# Patient Record
Sex: Female | Born: 1970
Health system: Southern US, Community
[De-identification: ages and names within clinical notes are randomized; demographics above are authoritative.]

## PROBLEM LIST (undated history)

## (undated) DIAGNOSIS — R519 Headache, unspecified: Secondary | ICD-10-CM

## (undated) DIAGNOSIS — R87619 Unspecified abnormal cytological findings in specimens from cervix uteri: Secondary | ICD-10-CM

## (undated) DIAGNOSIS — F988 Other specified behavioral and emotional disorders with onset usually occurring in childhood and adolescence: Secondary | ICD-10-CM

## (undated) DIAGNOSIS — G8929 Other chronic pain: Secondary | ICD-10-CM

## (undated) DIAGNOSIS — E538 Deficiency of other specified B group vitamins: Secondary | ICD-10-CM

## (undated) DIAGNOSIS — R51 Headache: Secondary | ICD-10-CM

## (undated) DIAGNOSIS — D649 Anemia, unspecified: Secondary | ICD-10-CM

## (undated) DIAGNOSIS — M549 Dorsalgia, unspecified: Secondary | ICD-10-CM

## (undated) DIAGNOSIS — C4491 Basal cell carcinoma of skin, unspecified: Secondary | ICD-10-CM

## (undated) HISTORY — DX: Anemia, unspecified: D64.9

## (undated) HISTORY — DX: Other chronic pain: G89.29

## (undated) HISTORY — DX: Headache: R51

## (undated) HISTORY — DX: Other specified behavioral and emotional disorders with onset usually occurring in childhood and adolescence: F98.8

## (undated) HISTORY — DX: Unspecified abnormal cytological findings in specimens from cervix uteri: R87.619

## (undated) HISTORY — DX: Basal cell carcinoma of skin, unspecified: C44.91

## (undated) HISTORY — DX: Headache, unspecified: R51.9

## (undated) HISTORY — DX: Dorsalgia, unspecified: M54.9

## (undated) HISTORY — DX: Deficiency of other specified B group vitamins: E53.8

## (undated) HISTORY — PX: COLPOSCOPY: SHX161

## (undated) HISTORY — PX: BUNIONECTOMY: SHX129

---

## 2008-05-09 DIAGNOSIS — R87619 Unspecified abnormal cytological findings in specimens from cervix uteri: Secondary | ICD-10-CM

## 2008-05-09 HISTORY — DX: Unspecified abnormal cytological findings in specimens from cervix uteri: R87.619

## 2008-05-20 LAB — HM PAP SMEAR

## 2010-04-16 ENCOUNTER — Ambulatory Visit: Payer: Self-pay | Admitting: Internal Medicine

## 2010-06-26 LAB — HM PAP SMEAR

## 2010-12-29 ENCOUNTER — Telehealth: Payer: Self-pay | Admitting: Internal Medicine

## 2010-12-30 MED ORDER — METHYLPHENIDATE HCL 10 MG PO TABS: ORAL_TABLET | ORAL | Status: DC

## 2010-12-30 NOTE — Telephone Encounter (Signed)
I called patient and pharmacy to get the last refill and strength prescribed.  I have printed the Rx for Dr. Darrick Huntsman to sign and patient will pick up.

## 2010-12-30 NOTE — Telephone Encounter (Signed)
Called patient to find out strength and directions. Left message for her to call me back.

## 2011-01-28 ENCOUNTER — Other Ambulatory Visit: Payer: Self-pay | Admitting: Internal Medicine

## 2011-01-28 MED ORDER — METHYLPHENIDATE HCL 10 MG PO TABS: ORAL_TABLET | ORAL | Status: DC

## 2011-02-09 ENCOUNTER — Encounter: Payer: Self-pay | Admitting: Internal Medicine

## 2011-02-09 ENCOUNTER — Ambulatory Visit (INDEPENDENT_AMBULATORY_CARE_PROVIDER_SITE_OTHER): Payer: PRIVATE HEALTH INSURANCE | Admitting: Internal Medicine

## 2011-02-09 VITALS — BP 126/89 | HR 94 | Temp 98.6°F | Resp 16 | Ht 64.0 in | Wt 121.2 lb

## 2011-02-09 DIAGNOSIS — S336XXA Sprain of sacroiliac joint, initial encounter: Secondary | ICD-10-CM

## 2011-02-09 DIAGNOSIS — M533 Sacrococcygeal disorders, not elsewhere classified: Secondary | ICD-10-CM

## 2011-02-09 DIAGNOSIS — R030 Elevated blood-pressure reading, without diagnosis of hypertension: Secondary | ICD-10-CM

## 2011-02-09 MED ORDER — PREDNISONE (PAK) 10 MG PO TABS: ORAL_TABLET | ORAL | Status: AC

## 2011-02-09 MED ORDER — METHOCARBAMOL 500 MG PO TABS: 500.0000 mg | ORAL_TABLET | Freq: Every evening | ORAL | Status: AC | PRN

## 2011-02-09 MED ORDER — TRAMADOL HCL 50 MG PO TABS: 50.0000 mg | ORAL_TABLET | Freq: Four times a day (QID) | ORAL | Status: DC | PRN

## 2011-02-09 NOTE — Patient Instructions (Addendum)
Your bp today was 126/89.  The goal is less than 130/80.  So check it a few times at a pharmacy and call me with the results.  You pain seems to be coming from a sacro iliac sprain.  I am prescribing prednisone (6 day taper), robaxin ( a muscle relaxer)to take at night,  And tramadol to takeas needed for pain every 6 hours.   If not improved significantly in 2 weeks,  Call to arrange x rays.

## 2011-02-09 NOTE — Progress Notes (Signed)
  Subjective:    Patient ID: Crystal Reyes, female    DOB: [birth year], 40 y.o.   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  Has tried ibuprofen, stretching, ice/heat,  No significant improvement.  No radiation to either thigh.  More prominent in sacroiliac area than centrally.  Aggravated by usual daily activities including witting at desk , prolonged standing and housework.  Worse by the end of the day. Past Medical History  Diagnosis Date  . Attention deficit disorder of adult without mention of hyperactivity     managed with methylphenidate  . Abnormal Pap smear of cervix 2010    normal biopsy, followup with GYN   Current Outpatient Prescriptions on File Prior to Visit  Medication Sig Dispense Refill  . methylphenidate (RITALIN) 10 MG tablet Take one and a half tablets two times a day.  90 tablet  0     Review of Systems  Constitutional: Negative for fever, chills and unexpected weight change.  HENT: Negative for hearing loss, ear pain, nosebleeds, congestion, sore throat, facial swelling, rhinorrhea, sneezing, mouth sores, trouble swallowing, neck pain, neck stiffness, voice change, postnasal drip, sinus pressure, tinnitus and ear discharge.   Eyes: Negative for pain, discharge, redness and visual disturbance.  Respiratory: Negative for cough, chest tightness, shortness of breath, wheezing and stridor.   Cardiovascular: Negative for chest pain, palpitations and leg swelling.  Musculoskeletal: Positive for back pain. Negative for myalgias and arthralgias.  Skin: Negative for color change and rash.  Neurological: Negative for dizziness, weakness, light-headedness and headaches.  Hematological: Negative for adenopathy.       Objective:   Physical Exam  Constitutional: She is oriented to person, place,  and time. She appears well-developed and well-nourished.  HENT:  Mouth/Throat: Oropharynx is clear and moist.  Eyes: EOM are normal. Pupils are equal, round, and reactive to light. No scleral icterus.  Neck: Normal range of motion. Neck supple. No JVD present. No thyromegaly present.  Cardiovascular: Normal rate, regular rhythm, normal heart sounds and intact distal pulses.   Pulmonary/Chest: Effort normal and breath sounds normal.  Abdominal: Soft. Bowel sounds are normal. She exhibits no mass. There is no tenderness.  Musculoskeletal: Normal range of motion. She exhibits no edema.       Right shoulder: She exhibits pain.       Legs: Lymphadenopathy:    She has no cervical adenopathy.  Neurological: She is alert and oriented to person, place, and time.  Skin: Skin is warm and dry.  Psychiatric: She has a normal mood and affect.          Assessment & Plan:  Bursitisi or sprain involving sacroiliiac area;  Secondary to strenuous activity, aggravated by daily activities.  trial of prednisone , MR and tramadol.  If no imporvement in two week witll x ray pelvis and lumbar spine .

## 2011-02-10 ENCOUNTER — Encounter: Payer: Self-pay | Admitting: Internal Medicine

## 2011-02-10 DIAGNOSIS — F988 Other specified behavioral and emotional disorders with onset usually occurring in childhood and adolescence: Secondary | ICD-10-CM | POA: Insufficient documentation

## 2011-02-10 DIAGNOSIS — R87619 Unspecified abnormal cytological findings in specimens from cervix uteri: Secondary | ICD-10-CM | POA: Insufficient documentation

## 2011-02-10 DIAGNOSIS — M533 Sacrococcygeal disorders, not elsewhere classified: Secondary | ICD-10-CM | POA: Insufficient documentation

## 2011-02-10 DIAGNOSIS — R030 Elevated blood-pressure reading, without diagnosis of hypertension: Secondary | ICD-10-CM | POA: Insufficient documentation

## 2011-02-10 NOTE — Assessment & Plan Note (Signed)
Avoid NSAIDs, patient to repeat BP at home over next two weeks .

## 2011-02-28 ENCOUNTER — Other Ambulatory Visit: Payer: Self-pay | Admitting: Internal Medicine

## 2011-02-28 DIAGNOSIS — F909 Attention-deficit hyperactivity disorder, unspecified type: Secondary | ICD-10-CM

## 2011-02-28 MED ORDER — METHYLPHENIDATE HCL 10 MG PO TABS: ORAL_TABLET | ORAL | Status: DC

## 2011-02-28 NOTE — Telephone Encounter (Signed)
Patient wants you to know her blood pressure 133/83 last week.

## 2011-04-05 ENCOUNTER — Other Ambulatory Visit: Payer: Self-pay | Admitting: Internal Medicine

## 2011-04-05 DIAGNOSIS — F909 Attention-deficit hyperactivity disorder, unspecified type: Secondary | ICD-10-CM

## 2011-04-05 MED ORDER — METHYLPHENIDATE HCL 10 MG PO TABS: ORAL_TABLET | ORAL | Status: DC

## 2011-04-05 NOTE — Telephone Encounter (Signed)
i printed out #2 prescriptions for 11/27 and dec 27 which need signign

## 2011-05-18 ENCOUNTER — Ambulatory Visit (INDEPENDENT_AMBULATORY_CARE_PROVIDER_SITE_OTHER): Payer: PRIVATE HEALTH INSURANCE | Admitting: Internal Medicine

## 2011-05-18 ENCOUNTER — Encounter: Payer: Self-pay | Admitting: Internal Medicine

## 2011-05-18 VITALS — BP 130/78 | HR 80 | Temp 98.4°F | Wt 124.0 lb

## 2011-05-18 DIAGNOSIS — F988 Other specified behavioral and emotional disorders with onset usually occurring in childhood and adolescence: Secondary | ICD-10-CM

## 2011-05-18 DIAGNOSIS — R87619 Unspecified abnormal cytological findings in specimens from cervix uteri: Secondary | ICD-10-CM

## 2011-05-18 DIAGNOSIS — Z207 Contact with and (suspected) exposure to pediculosis, acariasis and other infestations: Secondary | ICD-10-CM

## 2011-05-18 DIAGNOSIS — J329 Chronic sinusitis, unspecified: Secondary | ICD-10-CM

## 2011-05-18 DIAGNOSIS — Z2089 Contact with and (suspected) exposure to other communicable diseases: Secondary | ICD-10-CM

## 2011-05-18 MED ORDER — DOXYCYCLINE HYCLATE 50 MG PO CAPS: 50.0000 mg | ORAL_CAPSULE | Freq: Two times a day (BID) | ORAL | Status: AC

## 2011-05-18 NOTE — Patient Instructions (Signed)
Try rinsing sinuses twice daily with Simply saline,   Add sudafed PE if needed 10 mg every 6 hours for congestion (phenylephrine)  Doxycycline for antibiotic 100 mg twice daily TAKE WITH BREAKFAST AND DINNER  Try Delsym for cough

## 2011-05-18 NOTE — Progress Notes (Signed)
Subjective:    Patient ID: Crystal Reyes, female    DOB: 08-05-1970, 41 y.o.   MRN: [id number]  HPI  51 yo mother of 2 school age childlren presents with persistent sinus congestion and recent expousre to lice. She has had frontal sinus pain for the past 7 days.  She had similar symptoms at Christmas but it resolved spontaneously.  She has productive cough, rhinitis, facial pain not responding to oral decongestants.  Denies fevers .  2nd issue is recent infestation of children with headlice which occurred at their school.  She has fumigated her house and bathed in OTC Rid but needs someone to check her hair.  Denies any actual visualization of lice or intense itching.    Review of Systems  Constitutional: Negative for fever, chills and unexpected weight change.  HENT: Positive for congestion, rhinorrhea, postnasal drip and sinus pressure. Negative for hearing loss, ear pain, nosebleeds, sore throat, facial swelling, sneezing, mouth sores, trouble swallowing, neck pain, neck stiffness, voice change, tinnitus and ear discharge.   Eyes: Negative for pain, discharge, redness and visual disturbance.  Respiratory: Positive for cough. Negative for chest tightness, shortness of breath, wheezing and stridor.   Cardiovascular: Negative for chest pain, palpitations and leg swelling.  Musculoskeletal: Negative for myalgias and arthralgias.  Skin: Negative for color change and rash.  Neurological: Negative for dizziness, weakness, light-headedness and headaches.  Hematological: Negative for adenopathy.  Psychiatric/Behavioral: Positive for decreased concentration.       Objective:   Physical Exam  Constitutional: She is oriented to person, place, and time. She appears well-developed and well-nourished.  HENT:  Mouth/Throat: Oropharynx is clear and moist.  Eyes: EOM are normal. Pupils are equal, round, and reactive to light. No scleral icterus.  Neck: Normal range of motion. Neck supple. No JVD  present. No thyromegaly present.  Cardiovascular: Normal rate, regular rhythm, normal heart sounds and intact distal pulses.   Pulmonary/Chest: Effort normal and breath sounds normal.  Abdominal: Soft. Bowel sounds are normal. She exhibits no mass. There is no tenderness.  Musculoskeletal: Normal range of motion. She exhibits no edema.  Lymphadenopathy:    She has no cervical adenopathy.  Neurological: She is alert and oriented to person, place, and time.  Skin: Skin is warm and dry.  Psychiatric: She has a normal mood and affect.          Assessment & Plan:  Sinusitis:  Will treat with doxycyline, decongestnatsnand cough suppressants.  Recent exposure to headlice:  She has no evidence of nits on careful examination of hairshafts.  Reassurance provided.   Attention deficit disorder of adult without mention of hyperactivity Currently managed with medication.  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   Abnormal Pap smear of cervix With normal biopsy.  She prefers to continue annual GYN followup with her gynecologist.     Updated Medication List Outpatient Encounter Prescriptions as of 05/18/2011  Medication Sig Dispense Refill  . fish oil-omega-3 fatty acids 1000 MG capsule Take 2 g by mouth daily.        . methylphenidate (RITALIN) 10 MG tablet Take one and a half tablets two times a day.  90 tablet  0  . doxycycline (VIBRAMYCIN) 50 MG capsule Take 1 capsule (50 mg total) by mouth 2 (two) times daily.  14 capsule  0  . DISCONTD: buPROPion (WELLBUTRIN) 75 MG tablet Take 75 mg by mouth 2 (two) times daily.        Marland Kitchen  DISCONTD: methylphenidate (RITALIN) 10 MG tablet Take one and a half tablets two times a day.  90 tablet  0  . DISCONTD: traMADol (ULTRAM) 50 MG tablet Take 1 tablet (50 mg total) by mouth every 6 (six) hours as needed for pain.  60 tablet  0

## 2011-05-19 ENCOUNTER — Encounter: Payer: Self-pay | Admitting: Internal Medicine

## 2011-05-19 DIAGNOSIS — J329 Chronic sinusitis, unspecified: Secondary | ICD-10-CM | POA: Insufficient documentation

## 2011-05-19 DIAGNOSIS — Z207 Contact with and (suspected) exposure to pediculosis, acariasis and other infestations: Secondary | ICD-10-CM | POA: Insufficient documentation

## 2011-05-19 NOTE — Assessment & Plan Note (Signed)
Currently managed with medication.  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

## 2011-05-19 NOTE — Assessment & Plan Note (Signed)
With normal biopsy.  She prefers to continue annual GYN followup with her gynecologist.

## 2011-06-27 LAB — HM MAMMOGRAPHY

## 2011-07-04 ENCOUNTER — Telehealth: Payer: Self-pay | Admitting: Internal Medicine

## 2011-07-04 DIAGNOSIS — F909 Attention-deficit hyperactivity disorder, unspecified type: Secondary | ICD-10-CM

## 2011-07-04 MED ORDER — METHYLPHENIDATE HCL 10 MG PO TABS: ORAL_TABLET | ORAL | Status: DC

## 2011-07-04 NOTE — Telephone Encounter (Signed)
[phone] Pt called to get refill on generic ritalin 10mg  Please call when ready She would like to get 3 month of rx Pt would like to pick up wed morning

## 2011-07-04 NOTE — Telephone Encounter (Signed)
rx for Feb 25., March 25 and April 25 printed

## 2011-07-04 NOTE — Telephone Encounter (Signed)
Patient notified Rx's are ready to be picked up.  

## 2011-09-21 ENCOUNTER — Ambulatory Visit: Payer: Self-pay

## 2011-10-20 ENCOUNTER — Other Ambulatory Visit: Payer: Self-pay | Admitting: Internal Medicine

## 2011-10-20 NOTE — Telephone Encounter (Signed)
Can you authorize this in Dr. Melina Schools absence.

## 2011-10-20 NOTE — Telephone Encounter (Signed)
Refill on Ritalin 10 mg only has two days left.

## 2011-10-21 MED ORDER — METHYLPHENIDATE HCL 10 MG PO TABS: ORAL_TABLET | ORAL | Status: DC

## 2011-11-22 ENCOUNTER — Telehealth: Payer: Self-pay | Admitting: Internal Medicine

## 2011-11-22 DIAGNOSIS — F988 Other specified behavioral and emotional disorders with onset usually occurring in childhood and adolescence: Secondary | ICD-10-CM

## 2011-11-22 MED ORDER — METHYLPHENIDATE HCL 10 MG PO TABS: ORAL_TABLET | ORAL | Status: DC

## 2011-11-22 NOTE — Telephone Encounter (Signed)
She was last seen January,  Ok to refill for 30 days but must be seen prior to more refills BC it is a controlled substance.

## 2011-11-22 NOTE — Telephone Encounter (Signed)
Rx signed by Dr. Darrick Huntsman and given to pt.

## 2011-11-22 NOTE — Telephone Encounter (Signed)
Patient left message on machine asking for her prescription for the generic for ridlin she would like the 5 mg instead of the 10 mg she is trying to wing her self off of this medication. She would like to come by to pick this up today. I have pulled her chart and placed on DR. Tullo's desk.  Patient hasn't been seen here since we have been here. She stated on the voicemail that we have giving her a 3 month supply in the past. Please advise.

## 2011-11-22 NOTE — Telephone Encounter (Signed)
I have called and left msg for patient.

## 2011-11-23 ENCOUNTER — Other Ambulatory Visit: Payer: Self-pay | Admitting: Internal Medicine

## 2011-11-23 DIAGNOSIS — F988 Other specified behavioral and emotional disorders with onset usually occurring in childhood and adolescence: Secondary | ICD-10-CM

## 2011-11-23 MED ORDER — METHYLPHENIDATE HCL 5 MG PO TABS: ORAL_TABLET | ORAL | Status: DC

## 2011-11-23 NOTE — Telephone Encounter (Signed)
Crystal Reyes,  There must be two files on this patient because she has been seen here at least twice.  plese cclose this file.  i will issue correct refill on other chart.

## 2011-12-05 NOTE — Telephone Encounter (Signed)
Yes there are two files on this patient. Also patient has came and picked up prescription.

## 2012-04-25 ENCOUNTER — Encounter: Payer: Self-pay | Admitting: Internal Medicine

## 2012-04-25 ENCOUNTER — Ambulatory Visit (INDEPENDENT_AMBULATORY_CARE_PROVIDER_SITE_OTHER): Payer: PRIVATE HEALTH INSURANCE | Admitting: Internal Medicine

## 2012-04-25 VITALS — BP 112/64 | HR 87 | Temp 98.2°F | Resp 12 | Ht 64.5 in | Wt 129.5 lb

## 2012-04-25 DIAGNOSIS — Z23 Encounter for immunization: Secondary | ICD-10-CM

## 2012-04-25 DIAGNOSIS — E559 Vitamin D deficiency, unspecified: Secondary | ICD-10-CM

## 2012-04-25 DIAGNOSIS — Z13220 Encounter for screening for lipoid disorders: Secondary | ICD-10-CM

## 2012-04-25 DIAGNOSIS — Z1331 Encounter for screening for depression: Secondary | ICD-10-CM

## 2012-04-25 DIAGNOSIS — R5383 Other fatigue: Secondary | ICD-10-CM

## 2012-04-25 DIAGNOSIS — Z0000 Encounter for general adult medical examination without abnormal findings: Secondary | ICD-10-CM

## 2012-04-25 DIAGNOSIS — F988 Other specified behavioral and emotional disorders with onset usually occurring in childhood and adolescence: Secondary | ICD-10-CM

## 2012-04-25 DIAGNOSIS — R5381 Other malaise: Secondary | ICD-10-CM

## 2012-04-25 MED ORDER — METHYLPHENIDATE HCL 10 MG PO TABS: 10.0000 mg | ORAL_TABLET | Freq: Two times a day (BID) | ORAL | Status: DC

## 2012-04-25 NOTE — Patient Instructions (Addendum)
Return for fasting labs at [facility] with 1/2 tablet on the ritalin to avoid excessive stimulation

## 2012-04-29 ENCOUNTER — Encounter: Payer: Self-pay | Admitting: Internal Medicine

## 2012-04-29 DIAGNOSIS — Z0000 Encounter for general adult medical examination without abnormal findings: Secondary | ICD-10-CM | POA: Insufficient documentation

## 2012-04-29 DIAGNOSIS — Z1211 Encounter for screening for malignant neoplasm of colon: Secondary | ICD-10-CM | POA: Insufficient documentation

## 2012-04-29 NOTE — Assessment & Plan Note (Signed)
She is requesting to resume ritalin for increased difficulty managing tacks of work and home

## 2012-04-29 NOTE — Progress Notes (Signed)
Patient ID: Crystal Reyes, female   DOB: 12-27-1970, 41 y.o.   MRN: [id number]   Subjective:     Crystal Reyes is a 41 y.o. female and is here for a comprehensive physical exam. The patient reports problems - .  History   Social History  . Marital Status: Legally Separated    Spouse Name: N/A    Number of Children: N/A  . Years of Education: N/A   Occupational History  . attorney    Social History Main Topics  . Smoking status: Never Smoker   . Smokeless tobacco: Never Used  . Alcohol Use: Yes     Comment: wine occasional  . Drug Use: No  . Sexually Active: Not on file   Other Topics Concern  . Not on file   Social History Narrative  . No narrative on file   Health Maintenance  Topic Date Due  . Tetanus/tdap  06/14/1991  . Influenza Vaccine  01/07/2013  . Pap Smear  06/26/2013    The following portions of the patient's history were reviewed and updated as appropriate: allergies, current medications, past family history, past medical history, past social history, past surgical history and problem list.  Review of Systems A comprehensive review of systems was negative.   Objective:    . BP 112/64  Pulse 87  Temp 98.2 F (36.8 C) (Oral)  Resp 12  Ht 5' 4.5" (1.638 m)  Wt 129 lb 8 oz (58.741 kg)  BMI 21.89 kg/m2  SpO2 95%  General Appearance:    Alert, cooperative, no distress, appears stated age  Head:    Normocephalic, without obvious abnormality, atraumatic  Eyes:    PERRL, conjunctiva/corneas clear, EOM's intact, fundi    benign, both eyes  Ears:    Normal TM's and external ear canals, both ears  Nose:   Nares normal, septum midline, mucosa normal, no drainage    or sinus tenderness  Throat:   Lips, mucosa, and tongue normal; teeth and gums normal  Neck:   Supple, symmetrical, trachea midline, no adenopathy;    thyroid:  no enlargement/tenderness/nodules; no carotid   bruit or JVD  Back:     Symmetric, no curvature, ROM normal, no CVA tenderness   Lungs:     Clear to auscultation bilaterally, respirations unlabored  Chest Wall:    No tenderness or deformity   Heart:    Regular rate and rhythm, S1 and S2 normal, no murmur, rub   or gallop  Breast Exam:    No tenderness, masses, or nipple abnormality  Abdomen:     Soft, non-tender, bowel sounds active all four quadrants,    no masses, no organomegaly     Extremities:   Extremities normal, atraumatic, no cyanosis or edema  Pulses:   2+ and symmetric all extremities  Skin:   Skin color, texture, turgor normal, no rashes or lesions  Lymph nodes:   Cervical, supraclavicular, and axillary nodes normal  Neurologic:   CNII-XII intact, normal strength, sensation and reflexes    throughout        Assessment:   Attention deficit disorder of adult without mention of hyperactivity She is requesting to resume ritalin for increased difficulty managing tacks of work and home   Routine general medical examination at a health care facility nongyn exam was done today and normal.    Updated Medication List Outpatient Encounter Prescriptions as of 04/25/2012  Medication Sig Dispense Refill  . fish oil-omega-3 fatty acids 1000 MG capsule Take 2  g by mouth daily.        . methylphenidate (RITALIN) 10 MG tablet Take 1 tablet (10 mg total) by mouth 2 (two) times daily.  60 tablet  0  . methylphenidate (RITALIN) 10 MG tablet Take 1 tablet (10 mg total) by mouth 2 (two) times daily.  60 tablet  0  . methylphenidate (RITALIN) 10 MG tablet Take 1 tablet (10 mg total) by mouth 2 (two) times daily.  60 tablet  0  . [DISCONTINUED] methylphenidate (RITALIN) 5 MG tablet Take one and a half tablets two times a day.  90 tablet  0

## 2012-04-29 NOTE — Assessment & Plan Note (Signed)
nongyn exam was done today and normal.

## 2012-07-06 LAB — HM PAP SMEAR: HM Pap smear: NORMAL

## 2012-07-17 ENCOUNTER — Telehealth: Payer: Self-pay | Admitting: General Practice

## 2012-07-17 MED ORDER — METHYLPHENIDATE HCL 10 MG PO TABS: 10.0000 mg | ORAL_TABLET | Freq: Two times a day (BID) | ORAL | Status: DC

## 2012-07-17 NOTE — Telephone Encounter (Signed)
Rx placed up front for pt to pick up.

## 2012-07-17 NOTE — Telephone Encounter (Signed)
Pt called today to receive a 3 month written Rx for Ritalin. Will be in on Friday to pick up.

## 2012-07-17 NOTE — Telephone Encounter (Signed)
Ok to refill for 3 months,  Please print out

## 2012-07-19 ENCOUNTER — Telehealth: Payer: Self-pay | Admitting: Internal Medicine

## 2012-07-19 NOTE — Telephone Encounter (Signed)
Spoke with patient , assured that her appt. was made, her lab orders are in the system and that her prescription is ready for pick-up.

## 2012-07-19 NOTE — Telephone Encounter (Signed)
Pt coming in for lab work 3/14 at 10:15 a.m.Marland Kitchen  Pt states she spoke with you on Tuesday re this but you were having computer issues and could not pull up her info.  Pt also asked about her generic ritalin Rx, which is up front ready to be picked up per prior ph note.

## 2012-07-20 ENCOUNTER — Other Ambulatory Visit: Payer: PRIVATE HEALTH INSURANCE

## 2012-10-11 ENCOUNTER — Telehealth: Payer: Self-pay | Admitting: Internal Medicine

## 2012-10-11 MED ORDER — METHYLPHENIDATE HCL 5 MG PO TABS: 15.0000 mg | ORAL_TABLET | Freq: Two times a day (BID) | ORAL | Status: DC

## 2012-10-11 NOTE — Telephone Encounter (Signed)
Her last appointment was 12/13. Would you like me to make her an appointment before refilling?

## 2012-10-11 NOTE — Telephone Encounter (Signed)
She will need to make an appt for late June or early July, when she picks up this prescription.  Ritalin does not come in 15  Mg dose so I have written for 3 x 5 mg dose twice daily or #180 tablets/month

## 2012-10-11 NOTE — Telephone Encounter (Signed)
Patient needing a new prescription for Ritalin . She is wanting to up her dosage to 15 mg .  She is asking to have this prescription ready on tomorrow.   methylphenidate (RITALIN) 10 MG tablet

## 2012-10-12 NOTE — Telephone Encounter (Signed)
Patient picked up script

## 2012-11-07 ENCOUNTER — Telehealth: Payer: Self-pay | Admitting: *Deleted

## 2012-11-07 MED ORDER — METHYLPHENIDATE HCL 5 MG PO TABS: 15.0000 mg | ORAL_TABLET | Freq: Two times a day (BID) | ORAL | Status: DC

## 2012-11-07 NOTE — Telephone Encounter (Signed)
Pt notified Rx ready for pick up and to call back to schedule an appointment in July, verbalized understanding.

## 2012-11-07 NOTE — Telephone Encounter (Signed)
Needs refill for Ritalin, going out of town. Last OV 12/13

## 2012-11-07 NOTE — Telephone Encounter (Signed)
Left message for pt to return my call.

## 2012-11-07 NOTE — Telephone Encounter (Signed)
Ok to refill,  printed rx   Please remind her to set up appt in July

## 2012-12-05 ENCOUNTER — Encounter: Payer: Self-pay | Admitting: Internal Medicine

## 2012-12-05 ENCOUNTER — Ambulatory Visit (INDEPENDENT_AMBULATORY_CARE_PROVIDER_SITE_OTHER): Payer: PRIVATE HEALTH INSURANCE | Admitting: Internal Medicine

## 2012-12-05 VITALS — BP 110/72 | HR 79 | Temp 98.9°F | Resp 14 | Wt 126.2 lb

## 2012-12-05 DIAGNOSIS — E559 Vitamin D deficiency, unspecified: Secondary | ICD-10-CM

## 2012-12-05 DIAGNOSIS — R5383 Other fatigue: Secondary | ICD-10-CM

## 2012-12-05 DIAGNOSIS — F988 Other specified behavioral and emotional disorders with onset usually occurring in childhood and adolescence: Secondary | ICD-10-CM

## 2012-12-05 DIAGNOSIS — R5381 Other malaise: Secondary | ICD-10-CM

## 2012-12-05 DIAGNOSIS — R87619 Unspecified abnormal cytological findings in specimens from cervix uteri: Secondary | ICD-10-CM

## 2012-12-05 DIAGNOSIS — Z13220 Encounter for screening for lipoid disorders: Secondary | ICD-10-CM

## 2012-12-05 LAB — COMPREHENSIVE METABOLIC PANEL
ALT: 17 U/L (ref 0–35)
AST: 19 U/L (ref 0–37)
Albumin: 4.1 g/dL (ref 3.5–5.2)
Alkaline Phosphatase: 43 U/L (ref 39–117)
BUN: 10 mg/dL (ref 6–23)
CO2: 27 mEq/L (ref 19–32)
Calcium: 8.9 mg/dL (ref 8.4–10.5)
Chloride: 106 mEq/L (ref 96–112)
Creatinine, Ser: 0.7 mg/dL (ref 0.4–1.2)
GFR: 92.16 mL/min (ref >60.00)
Glucose, Bld: 85 mg/dL (ref 70–99)
Potassium: 3.6 mEq/L (ref 3.5–5.1)
Sodium: 139 mEq/L (ref 135–145)
Total Bilirubin: 0.9 mg/dL (ref 0.3–1.2)
Total Protein: 7 g/dL (ref 6.0–8.3)

## 2012-12-05 LAB — CBC WITH DIFFERENTIAL/PLATELET
Basophils Absolute: 0.1 10*3/uL (ref 0.0–0.1)
Basophils Relative: 1.2 % (ref 0.0–3.0)
Eosinophils Absolute: 0.4 10*3/uL (ref 0.0–0.7)
Eosinophils Relative: 7.2 % — ABNORMAL HIGH (ref 0.0–5.0)
HCT: 40.9 % (ref 36.0–46.0)
Hemoglobin: 13.9 g/dL (ref 12.0–15.0)
Lymphocytes Relative: 36.7 % (ref 12.0–46.0)
Lymphs Abs: 1.9 10*3/uL (ref 0.7–4.0)
MCHC: 33.9 g/dL (ref 30.0–36.0)
MCV: 89.6 fl (ref 78.0–100.0)
Monocytes Absolute: 0.3 10*3/uL (ref 0.1–1.0)
Monocytes Relative: 6.3 % (ref 3.0–12.0)
Neutro Abs: 2.5 10*3/uL (ref 1.4–7.7)
Neutrophils Relative %: 48.6 % (ref 43.0–77.0)
Platelets: 203 10*3/uL (ref 150.0–400.0)
RBC: 4.57 Mil/uL (ref 3.87–5.11)
RDW: 12.2 % (ref 11.5–14.6)
WBC: 5.2 10*3/uL (ref 4.5–10.5)

## 2012-12-05 LAB — LIPID PANEL
Cholesterol: 226 mg/dL — ABNORMAL HIGH (ref 0–200)
HDL: 69.2 mg/dL (ref >39.00)
Total CHOL/HDL Ratio: 3
Triglycerides: 48 mg/dL (ref 0.0–149.0)
VLDL: 9.6 mg/dL (ref 0.0–40.0)

## 2012-12-05 LAB — LDL CHOLESTEROL, DIRECT: Direct LDL: 136.8 mg/dL

## 2012-12-05 LAB — TSH: TSH: 1.62 u[IU]/mL (ref 0.35–5.50)

## 2012-12-05 MED ORDER — METHYLPHENIDATE HCL 5 MG PO TABS: 15.0000 mg | ORAL_TABLET | Freq: Two times a day (BID) | ORAL | Status: DC

## 2012-12-05 NOTE — Assessment & Plan Note (Signed)
Stable on current ritalin dose.  UDS to be done today.  3 months of refills given

## 2012-12-05 NOTE — Assessment & Plan Note (Addendum)
Repeat PAP smear was regular in February

## 2012-12-05 NOTE — Progress Notes (Signed)
Patient ID: Crystal Reyes, female   DOB: [birth year], 42 y.o.   MRN: [id number]  Patient Active Problem List   Diagnosis Date Noted  . Routine general medical examination at a health care facility 04/29/2012  . Attention deficit disorder of adult without mention of hyperactivity   . Abnormal Pap smear of cervix     Subjective:  CC:   Chief Complaint  Patient presents with  . Follow-up    for meds    HPI:   Crystal Reyes a 42 y.o. female who presents for 6 month follow up for ADD requiring use of medications for job performance as an Pensions consultant for [employer].  Her weight, blood pressure and pulse is normal and stable.  She has not escalated her use of adderall.  She has been increasingly busy, working full time and raising 4 children.  She is also  engaged (to patient  Crystal Reyes) .    Past Medical History  Diagnosis Date  . Attention deficit disorder of adult without mention of hyperactivity     managed with methylphenidate  . Abnormal Pap smear of cervix 2010    normal biopsy, followup with GYN    History reviewed. No pertinent past surgical history.     The following portions of the patient's history were reviewed and updated as appropriate: Allergies, current medications, and problem list.    Review of Systems:   Patient denies headache, fevers, malaise, unintentional weight loss, skin rash, eye pain, sinus congestion and sinus pain, sore throat, dysphagia,  hemoptysis , cough, dyspnea, wheezing, chest pain, palpitations, orthopnea, edema, abdominal pain, nausea, melena, diarrhea, constipation, flank pain, dysuria, hematuria, urinary  Frequency, nocturia, numbness, tingling, seizures,  Focal weakness, Loss of consciousness,  Tremor, insomnia, depression, anxiety, and suicidal ideation.     History   Social History  . Marital Status: Legally Separated    Spouse Name: N/A    Number of Children: N/A  . Years of Education: N/A   Occupational History  .  attorney    Social History Main Topics  . Smoking status: Never Smoker   . Smokeless tobacco: Never Used  . Alcohol Use: Yes     Comment: wine occasional  . Drug Use: No  . Sexually Active: Not on file   Other Topics Concern  . Not on file   Social History Narrative  . No narrative on file    Objective:  BP 110/72  Pulse 79  Temp(Src) 98.9 F (37.2 C) (Oral)  Resp 14  Wt 126 lb 4 oz (57.267 kg)  BMI 21.34 kg/m2  SpO2 98%  LMP 12/04/2012  General appearance: alert, cooperative and appears stated age Ears: normal TM's and external ear canals both ears Throat: lips, mucosa, and tongue normal; teeth and gums normal Neck: no adenopathy, no carotid bruit, supple, symmetrical, trachea midline and thyroid not enlarged, symmetric, no tenderness/mass/nodules Back: symmetric, no curvature. ROM normal. No CVA tenderness. Lungs: clear to auscultation bilaterally Heart: regular rate and rhythm, S1, S2 normal, no murmur, click, rub or gallop Abdomen: soft, non-tender; bowel sounds normal; no masses,  no organomegaly Pulses: 2+ and symmetric Skin: Skin color, texture, turgor normal. No rashes or lesions Lymph nodes: Cervical, supraclavicular, and axillary nodes normal.  Assessment and Plan:  Abnormal Pap smear of cervix Repeat PAP smear was regular in February   Attention deficit disorder of adult without mention of hyperactivity Stable on current ritalin dose.  UDS to be done today.  3 months of refills given  Updated Medication List Outpatient Encounter Prescriptions as of 12/05/2012  Medication Sig Dispense Refill  . methylphenidate (RITALIN) 5 MG tablet Take 3 tablets (15 mg total) by mouth 2 (two) times daily.  180 tablet  0  . [DISCONTINUED] methylphenidate (RITALIN) 5 MG tablet Take 3 tablets (15 mg total) by mouth 2 (two) times daily.  180 tablet  0  . [DISCONTINUED] methylphenidate (RITALIN) 5 MG tablet Take 3 tablets (15 mg total) by mouth 2 (two) times daily. May  refill on or after December 08 2012  180 tablet  0  . [DISCONTINUED] methylphenidate (RITALIN) 5 MG tablet Take 3 tablets (15 mg total) by mouth 2 (two) times daily. May refill on or after January 08 2013  180 tablet  0  . fish oil-omega-3 fatty acids 1000 MG capsule Take 2 g by mouth daily.         No facility-administered encounter medications on file as of 12/05/2012.     Orders Placed This Encounter  Procedures  . LDL cholesterol, direct  . HM PAP SMEAR    No Follow-up on file.

## 2012-12-06 LAB — VITAMIN D 25 HYDROXY (VIT D DEFICIENCY, FRACTURES): Vit D, 25-Hydroxy: 33 ng/mL (ref 30–89)

## 2012-12-07 ENCOUNTER — Encounter: Payer: Self-pay | Admitting: *Deleted

## 2013-03-14 ENCOUNTER — Other Ambulatory Visit: Payer: Self-pay | Admitting: Internal Medicine

## 2013-03-14 ENCOUNTER — Other Ambulatory Visit: Payer: Self-pay

## 2013-03-14 DIAGNOSIS — F988 Other specified behavioral and emotional disorders with onset usually occurring in childhood and adolescence: Secondary | ICD-10-CM

## 2013-03-14 MED ORDER — METHYLPHENIDATE HCL 5 MG PO TABS: 15.0000 mg | ORAL_TABLET | Freq: Two times a day (BID) | ORAL | Status: DC

## 2013-03-14 NOTE — Telephone Encounter (Signed)
Ok

## 2013-03-14 NOTE — Telephone Encounter (Signed)
Ok to refill 3 months,  Please print out

## 2013-03-14 NOTE — Telephone Encounter (Signed)
Pt left vm.  States she was recently married new name:  Crystal Reyes and insurance card does reflect the new name.  Needs 3 mth refill of generic Ritalin 15 mg b.i.d.  She would like to pick up rx on Friday 11/7.

## 2013-03-15 ENCOUNTER — Encounter: Payer: Self-pay | Admitting: *Deleted

## 2013-03-15 MED ORDER — METHYLPHENIDATE HCL 5 MG PO TABS: 15.0000 mg | ORAL_TABLET | Freq: Two times a day (BID) | ORAL | Status: DC

## 2013-03-15 NOTE — Telephone Encounter (Signed)
As advised by MD refilled for December and January. Left message for patient to call office scripts ready to be picked up.

## 2013-03-15 NOTE — Addendum Note (Signed)
Addended by: Henrene Pastor R on: 03/15/2013 11:04 AM   Modules accepted: Orders

## 2013-03-20 ENCOUNTER — Telehealth: Payer: Self-pay | Admitting: Internal Medicine

## 2013-03-20 NOTE — Telephone Encounter (Signed)
Ritalin.  Pt states her pharmacy told her they had faxed [place] some paperwork regarding her insurance and have not heard back.

## 2013-03-21 NOTE — Telephone Encounter (Signed)
Called pharmacy to verify paper work none has been sent awaiting pharmacy to send prior authorization.

## 2013-03-25 NOTE — Telephone Encounter (Signed)
Called pharmacy still have not received prior authorization paper work. Pharmacy promised to fax today . Called patient left voicemail to return call to office.

## 2013-03-26 NOTE — Telephone Encounter (Signed)
Called pharmacy again today still have not received prior authorization for patient gave nursing fax number to send fax. [phone] number given to [facility].

## 2013-03-28 NOTE — Telephone Encounter (Signed)
Prior authorization has been faxed to pharmacy

## 2013-04-02 ENCOUNTER — Telehealth: Payer: Self-pay | Admitting: Internal Medicine

## 2013-04-02 NOTE — Telephone Encounter (Signed)
Pt's spouse stopped in and he says that pt auth has not been done for the rittalin and they are needing that with-in 48 hours. [phone] Ros[facility] Auerbach they just switched over to [place]. Please call pt when Berkley Harvey has been completed.

## 2013-04-02 NOTE — Telephone Encounter (Signed)
Called Rosann Auerbach they do not have anyone by her name in there system, try to call pt but didn't get an answer

## 2013-04-02 NOTE — Telephone Encounter (Signed)
Dr. Darrick Huntsman has form came today

## 2013-04-02 NOTE — Telephone Encounter (Signed)
Left message for patient to return call to office. 

## 2013-04-05 NOTE — Telephone Encounter (Signed)
Pt called about the status of PA, [employer] was closed for the holiday

## 2013-04-12 ENCOUNTER — Telehealth: Payer: Self-pay | Admitting: *Deleted

## 2013-04-15 NOTE — Telephone Encounter (Signed)
The patient's insurance company has not received the prior authorization please fax again. The patient wants a call back when this has been done.

## 2013-04-16 NOTE — Telephone Encounter (Signed)
Refaxed to [employer] to day and  Called  [employer] patient medication has been approved for Three years.

## 2013-04-19 ENCOUNTER — Encounter: Payer: Self-pay | Admitting: Internal Medicine

## 2013-05-22 ENCOUNTER — Encounter: Payer: Self-pay | Admitting: Internal Medicine

## 2013-05-22 ENCOUNTER — Ambulatory Visit (INDEPENDENT_AMBULATORY_CARE_PROVIDER_SITE_OTHER): Payer: Managed Care, Other (non HMO) | Admitting: Internal Medicine

## 2013-05-22 VITALS — BP 106/76 | HR 95 | Temp 98.2°F | Resp 16 | Wt 126.5 lb

## 2013-05-22 DIAGNOSIS — Z79899 Other long term (current) drug therapy: Secondary | ICD-10-CM

## 2013-05-22 DIAGNOSIS — Z0000 Encounter for general adult medical examination without abnormal findings: Secondary | ICD-10-CM

## 2013-05-22 DIAGNOSIS — R87619 Unspecified abnormal cytological findings in specimens from cervix uteri: Secondary | ICD-10-CM

## 2013-05-22 DIAGNOSIS — Z113 Encounter for screening for infections with a predominantly sexual mode of transmission: Secondary | ICD-10-CM

## 2013-05-22 DIAGNOSIS — E785 Hyperlipidemia, unspecified: Secondary | ICD-10-CM

## 2013-05-22 DIAGNOSIS — D721 Eosinophilia, unspecified: Secondary | ICD-10-CM

## 2013-05-22 LAB — CBC WITH DIFFERENTIAL/PLATELET
Basophils Absolute: 0 10*3/uL (ref 0.0–0.1)
Basophils Relative: 0.6 % (ref 0.0–3.0)
Eosinophils Absolute: 0.3 10*3/uL (ref 0.0–0.7)
Eosinophils Relative: 5.9 % — ABNORMAL HIGH (ref 0.0–5.0)
HCT: 39.1 % (ref 36.0–46.0)
Hemoglobin: 13.5 g/dL (ref 12.0–15.0)
Lymphocytes Relative: 27.1 % (ref 12.0–46.0)
Lymphs Abs: 1.2 10*3/uL (ref 0.7–4.0)
MCHC: 34.6 g/dL (ref 30.0–36.0)
MCV: 87.2 fl (ref 78.0–100.0)
Monocytes Absolute: 0.5 10*3/uL (ref 0.1–1.0)
Monocytes Relative: 11.4 % (ref 3.0–12.0)
Neutro Abs: 2.5 10*3/uL (ref 1.4–7.7)
Neutrophils Relative %: 55 % (ref 43.0–77.0)
Platelets: 189 10*3/uL (ref 150.0–400.0)
RBC: 4.48 Mil/uL (ref 3.87–5.11)
RDW: 12 % (ref 11.5–14.6)
WBC: 4.5 10*3/uL (ref 4.5–10.5)

## 2013-05-22 MED ORDER — METHYLPHENIDATE HCL 5 MG PO TABS: 15.0000 mg | ORAL_TABLET | Freq: Two times a day (BID) | ORAL | Status: DC

## 2013-05-22 NOTE — Patient Instructions (Signed)
Take 1000 to 2000 units D3 during winter months  repeat CBC today  Check with [facility]  About flu shot    We'll try to get TdaP from [facility]

## 2013-05-22 NOTE — Progress Notes (Addendum)
Patient ID: Crystal Reyes, female   DOB: 1970/12/22, 43 y.o.   MRN: [id number]    Subjective:     Crystal Reyes is a 43 y.o. female and is here for a comprehensive physical exam. The patient reports no problems. She feels generally well, and has no acute issues. She recently remarried to fellow patient Crystal Reyes.  They have 6 children between them from prior marriages, and the children are adjusting well.  Her PAP smear  was normal , done by GYN  Feb 2014.  Mammogram to be ordered.  She continues to use a stable dose of Ritalin to manage her ADD.   History   Social History  . Marital Status: Legally Separated    Spouse Name: N/A    Number of Children: N/A  . Years of Education: N/A   Occupational History  . attorney    Social History Main Topics  . Smoking status: Never Smoker   . Smokeless tobacco: Never Used  . Alcohol Use: Yes     Comment: wine occasional  . Drug Use: No  . Sexual Activity: Not on file   Other Topics Concern  . Not on file   Social History Narrative  . No narrative on file   Health Maintenance  Topic Date Due  . Tetanus/tdap  06/14/1991  . Influenza Vaccine  12/07/2012  . Pap Smear  07/07/2015    The following portions of the patient's history were reviewed and updated as appropriate: allergies, current medications, past family history, past medical history, past social history, past surgical history and problem list.  Review of Systems A comprehensive review of systems was negative.   Objective:   BP 106/76  Pulse 95  Temp(Src) 98.2 F (36.8 C) (Oral)  Resp 16  Wt 126 lb 8 oz (57.38 kg)  SpO2 99%  LMP 05/15/2013  General Appearance:    Alert, cooperative, no distress, appears stated age  Head:    Normocephalic, without obvious abnormality, atraumatic  Eyes:    PERRL, conjunctiva/corneas clear, EOM's intact, fundi    benign, both eyes  Ears:    Normal TM's and external ear canals, both ears  Nose:   Nares  normal, septum midline, mucosa normal, no drainage    or sinus tenderness  Throat:   Lips, mucosa, and tongue normal; teeth and gums normal  Neck:   Supple, symmetrical, trachea midline, no adenopathy;    thyroid:  no enlargement/tenderness/nodules; no carotid   bruit or JVD  Back:     Symmetric, no curvature, ROM normal, no CVA tenderness  Lungs:     Clear to auscultation bilaterally, respirations unlabored  Chest Wall:    No tenderness or deformity   Heart:    Regular rate and rhythm, S1 and S2 normal, no murmur, rub   or gallop  Breast Exam:    No tenderness, masses, or nipple abnormality  Abdomen:     Soft, non-tender, bowel sounds active all four quadrants,    no masses, no organomegaly  Extremities:   Extremities normal, atraumatic, no cyanosis or edema  Pulses:   2+ and symmetric all extremities  Skin:   Skin color, texture, turgor normal, no rashes or lesions  Lymph nodes:   Cervical, supraclavicular, and axillary nodes normal  Neurologic:   CNII-XII intact, normal strength, sensation and reflexes    throughout   Assessment and Plan:     Abnormal Pap smear of cervix She has annual Pap smears done by her gynecologist  since she has a history of abnormal Pap smears recently. Last one was  Normal February 2014  Adult attention deficit disorder Currently well-controlled on low-dose Ritalin. Refills for 3 months given.  Routine general medical examination at a health care facility Annual comprehensive exam was done including breast,excluding  pelvic and PAP smear. All screenings have been addressed .    Updated Medication List Outpatient Encounter Prescriptions as of 05/22/2013  Medication Sig  . levonorgestrel-ethinyl estradiol (ORSYTHIA) 0.1-20 MG-MCG tablet Take 1 tablet by mouth daily.  . methylphenidate (RITALIN) 5 MG tablet Take 3 tablets (15 mg total) by mouth 2 (two) times daily.  . [DISCONTINUED] methylphenidate (RITALIN) 5 MG tablet Take 3 tablets (15 mg total) by  mouth 2 (two) times daily.  . [DISCONTINUED] methylphenidate (RITALIN) 5 MG tablet Take 3 tablets (15 mg total) by mouth 2 (two) times daily.  . [DISCONTINUED] methylphenidate (RITALIN) 5 MG tablet Take 3 tablets (15 mg total) by mouth 2 (two) times daily.  . fish oil-omega-3 fatty acids 1000 MG capsule Take 2 g by mouth daily.

## 2013-05-25 NOTE — Assessment & Plan Note (Signed)
She has annual Pap smears done by her gynecologist since she has a history of abnormal Pap smears recently. Last one was  Normal February 2014

## 2013-05-25 NOTE — Assessment & Plan Note (Signed)
Currently well-controlled on low-dose Ritalin. Refills for 3 months given.

## 2013-05-25 NOTE — Assessment & Plan Note (Signed)
Annual comprehensive exam was done including breast, excluding pelvic and PAP smear. All screenings have been addressed .  

## 2013-05-25 NOTE — Addendum Note (Signed)
Addended by: Crecencio Mc on: 05/25/2013 02:18 PM   Modules accepted: Orders

## 2013-05-27 ENCOUNTER — Encounter: Payer: Self-pay | Admitting: *Deleted

## 2013-07-26 LAB — HM PAP SMEAR: HM Pap smear: NORMAL

## 2013-09-18 ENCOUNTER — Telehealth: Payer: Self-pay | Admitting: Internal Medicine

## 2013-09-18 MED ORDER — METHYLPHENIDATE HCL 5 MG PO TABS: 15.0000 mg | ORAL_TABLET | Freq: Two times a day (BID) | ORAL | Status: DC

## 2013-09-18 NOTE — Telephone Encounter (Signed)
Refills printed.  

## 2013-09-18 NOTE — Telephone Encounter (Signed)
Ok to fill 

## 2013-09-18 NOTE — Telephone Encounter (Signed)
Pt called needing to get the  June July august refill on her rx  Generic ritlian Pt would like to pick up tomorrow

## 2013-09-19 NOTE — Telephone Encounter (Signed)
Patient notified script placed up front.

## 2013-12-25 ENCOUNTER — Telehealth: Payer: Self-pay | Admitting: Internal Medicine

## 2013-12-25 MED ORDER — METHYLPHENIDATE HCL 5 MG PO TABS: 15.0000 mg | ORAL_TABLET | Freq: Two times a day (BID) | ORAL | Status: DC

## 2013-12-25 NOTE — Telephone Encounter (Signed)
Scripts in quick sign folder.

## 2013-12-25 NOTE — Telephone Encounter (Signed)
Ok to fill 

## 2013-12-25 NOTE — Telephone Encounter (Signed)
Yes please print out and I will sign 3 months worth

## 2013-12-25 NOTE — Telephone Encounter (Signed)
Pt called in and was needing to get a 3 month refill on her 5 mg ritalin.

## 2013-12-26 NOTE — Telephone Encounter (Signed)
Left message for pt to return my call. Rx ready for pickup

## 2014-01-14 ENCOUNTER — Ambulatory Visit (INDEPENDENT_AMBULATORY_CARE_PROVIDER_SITE_OTHER): Payer: Managed Care, Other (non HMO) | Admitting: Podiatry

## 2014-01-14 ENCOUNTER — Encounter: Payer: Self-pay | Admitting: Podiatry

## 2014-01-14 ENCOUNTER — Ambulatory Visit (INDEPENDENT_AMBULATORY_CARE_PROVIDER_SITE_OTHER): Payer: Managed Care, Other (non HMO)

## 2014-01-14 VITALS — BP 128/83 | HR 80 | Resp 16 | Ht 65.0 in | Wt 123.0 lb

## 2014-01-14 DIAGNOSIS — G8918 Other acute postprocedural pain: Secondary | ICD-10-CM

## 2014-01-14 DIAGNOSIS — M2061 Acquired deformities of toe(s), unspecified, right foot: Secondary | ICD-10-CM

## 2014-01-14 DIAGNOSIS — M205X1 Other deformities of toe(s) (acquired), right foot: Secondary | ICD-10-CM

## 2014-01-14 DIAGNOSIS — M2060 Acquired deformities of toe(s), unspecified, unspecified foot: Secondary | ICD-10-CM

## 2014-01-14 DIAGNOSIS — M21619 Bunion of unspecified foot: Secondary | ICD-10-CM

## 2014-01-14 DIAGNOSIS — T8484XA Pain due to internal orthopedic prosthetic devices, implants and grafts, initial encounter: Secondary | ICD-10-CM

## 2014-01-14 MED ORDER — EFINACONAZOLE 10 % EX SOLN: 1.0000 [drp] | Freq: Every day | CUTANEOUS | Status: DC

## 2014-01-14 NOTE — Progress Notes (Addendum)
   Subjective:    Patient ID: Crystal Reyes, female    DOB: 1971/01/12, 43 y.o.   MRN: [id number]  HPI Comments: 43 year old female presents the office with complaints of bilateral foot pain over the area of prior surgery. She states that she had Taylor's bunion surgery approximately 20 years ago. She states that she recently has noticed some pain over the area of the hardware. Is no discomfort with shoe gear and activity. She is inquiring about removal of the hardware.   She also has secondary complaints of toenail fungus. She states it has been getting worse over a period of timeand  it has been present for approximately 10 years. She's tried over-the-counter therapy without any resolution. Denies any redness or drainage from the nails. No other complaints.   Foot Pain      Review of Systems  Musculoskeletal:       Joint pain  Skin:       Change in nails  All other systems reviewed and are negative.      Objective:   Physical Exam AAO x3, NAD. DP/PT pulses palpable 2/4 b/l. CRT < 3sec Protective sensation intact with Derrel Nip monofilament, vibratory sensation intact, Achilles tendon reflex intact. Mild discomfort over the lateral basis of the fifth metatarsals bilaterally. Screws were slightly palpable but not prominent. No overlying open lesions and skin is intact. Right fifth digit medially deviated sitting on top of the fourth digit. Unable to move fifth MTPJ or digit.  MMT 5/5, ROM WNL Nails hypertrophic, dystrophic, discolored, brittle. No surrounding erythema or drainage. No leg pain/warmth/edema    Assessment & Plan:  43 year old female with symptomatic hardware over bilateral fifth metatarsal base is from prior surgery, likely onychomycosis. -X-Rays are obtained and reviewed with the patient. -Conservative versus surgical treatment as the patient in detail including alternatives, risks, complications. -Patient is considering having hardware removal  due to the pain over the area. Risks of surgery were discussed at great length with the patient including, but not limited to, infection, bleeding, swelling, need for further surgery, retained hardware, broken hardware, painful or ugly scar, numbness or sensation changes, blood clots, flail or floppy toe. Also if proceed with removal we'll consider doing an extensor tenotomy of the fifth digit right foot and possible MTPJ release and digit correction. Risks of this were also discussed which included the above. -Will continue with conservative treatment including shoe gear modifications for now. We'll discuss surgery with her family/work. -Prescribed Jublia. Rx sent to [facility] and patient given information to verify insurance. Discussed risks and side affects of medication and directed to stop immediately if any occur and call the office.  -Followup as needed or sooner if any problems are to arise or any changes in symptoms. Call with any questions or concerns.

## 2014-01-14 NOTE — Patient Instructions (Signed)
Hardware Removal Hardware removal is a procedure that removes medical devices used to repair broken bones. Hardware may be pins, screws, rods, wires, plates, or other implants. Some types of hardware are meant to stay in place permanently. Other types of hardware may be removed after the broken bone has healed. Sometimes, hardware is removed because it causes problems. These problems include infection, ongoing pain, or failure of the device. In certain cases, older types of hardware may be replaced with newer, better materials. Young children often need to have hardware removed in order to allow for proper bone growth. LET YOUR CAREGIVER KNOW ABOUT:   Allergies to food or medicine.  Medicines taken, including vitamins, herbs, eyedrops, over-the-counter medicines, and creams.  Use of steroids (by mouth or creams).  Previous problems with anesthetics or numbing medicines.  History of bleeding problems or blood clots.  Previous surgery.  Other health problems, including diabetes and kidney problems.  Possibility of pregnancy, if this applies. RISKS AND COMPLICATIONS   Infection.  Bleeding.  Pain.  Bone breaking again (refracture).  Failure to completely remove all implants. BEFORE THE PROCEDURE   You may be asked to stop taking blood thinners, aspirin, and/or nonsteroidal anti-inflammatory drugs (like ibuprofen) before the procedure.  You will usually be asked to stop eating and drinking at least 6 hours before the procedure.  If your procedure is done so that you go home the same day (outpatient), you will need someone to drive you home. PROCEDURE   You will be asked to change into a hospital gown.  You will lie on an exam table. A variety of monitors will be connected to you in order to track your heart rate, blood pressure, and breathing throughout the procedure.  You will have an intravenous (IV) access placed in your vein.  You may be given general anesthesia to help  you sleep through the procedure or a sedative to relax you. You will also be given a local or regional anesthetic to numb the area.  X-rays may be taken to accurately find the hardware.  The surgeon will make a cut (incision) over the area where the hardware is located.  The hardware will be carefully removed.  The incision will be closed with stitches (sutures), staples, or special glue. A bandage will be placed over the area to keep it clean and dry.  Often splints, casts, or removable walking boots are used to protect your limb while your wound heals. AFTER THE PROCEDURE   You may be sleepy.  You may have some pain or feel sick to your stomach (nauseous). This can usually be controlled with medicines.  You will stay in the recovery room until you are awake and able to drink fluids.  Check with your caregiver about when you can return to your usual level of activity or whether you will need physical therapy or rehabilitation. HOME CARE INSTRUCTIONS   Take all medicines exactly as directed.  Follow any prescribed diet.  Follow instructions regarding both rest and physical activity. Be sure you understand when it is okay to bear weight. SEEK IMMEDIATE MEDICAL CARE IF:   You have severe or lasting pain.  You or your child has an oral temperature above 102 F (38.9 C), not controlled by medicine.  Chills develop.  The incision area appears red, hot, puffy (swollen), or covered with pus.  You have difficulty breathing.  You cannot pass gas.  You are unable to have a bowel movement within 2 days.  You  have persistent numbness in the limb beyond 24 hours. MAKE SURE YOU:   Understand these instructions.  Will watch your condition.  Will get help right away if you are not doing well or get worse. Document Released: 02/20/2009 Document Revised: 07/18/2011 Document Reviewed: 02/20/2009 [facility] Patient Information 2015 [place], [place]. This information is not intended to  replace advice given to you by your health care provider. Make sure you discuss any questions you have with your health care provider.

## 2014-03-21 ENCOUNTER — Other Ambulatory Visit: Payer: Self-pay

## 2014-03-21 NOTE — Telephone Encounter (Signed)
Patient is scheduled. Thanks!

## 2014-03-21 NOTE — Telephone Encounter (Signed)
The patient called hoping to get a refill on her ritalin medication.  callback [phone]

## 2014-03-21 NOTE — Telephone Encounter (Signed)
Last visit 05/22/13

## 2014-03-21 NOTE — Telephone Encounter (Signed)
2 issues regarding ritalin refills:  1)  She was given refills through Dec 6th (last one in chart say "may refill on or after Nov 6th")   2)  since it is a controlled substance  I have to see her every 6 months  , which would have been July.  Cannot refill until Dec 6th,  And needs appt 15 min follow prior to that

## 2014-03-24 ENCOUNTER — Ambulatory Visit (INDEPENDENT_AMBULATORY_CARE_PROVIDER_SITE_OTHER): Payer: Managed Care, Other (non HMO) | Admitting: *Deleted

## 2014-03-24 ENCOUNTER — Ambulatory Visit (INDEPENDENT_AMBULATORY_CARE_PROVIDER_SITE_OTHER): Payer: Managed Care, Other (non HMO) | Admitting: Internal Medicine

## 2014-03-24 ENCOUNTER — Encounter: Payer: Self-pay | Admitting: Internal Medicine

## 2014-03-24 VITALS — BP 118/78 | HR 70 | Temp 98.5°F | Resp 16 | Ht 65.0 in | Wt 129.5 lb

## 2014-03-24 DIAGNOSIS — Z113 Encounter for screening for infections with a predominantly sexual mode of transmission: Secondary | ICD-10-CM

## 2014-03-24 DIAGNOSIS — Z23 Encounter for immunization: Secondary | ICD-10-CM

## 2014-03-24 DIAGNOSIS — E785 Hyperlipidemia, unspecified: Secondary | ICD-10-CM

## 2014-03-24 DIAGNOSIS — F988 Other specified behavioral and emotional disorders with onset usually occurring in childhood and adolescence: Secondary | ICD-10-CM

## 2014-03-24 DIAGNOSIS — Z79899 Other long term (current) drug therapy: Secondary | ICD-10-CM

## 2014-03-24 DIAGNOSIS — F900 Attention-deficit hyperactivity disorder, predominantly inattentive type: Secondary | ICD-10-CM

## 2014-03-24 DIAGNOSIS — F909 Attention-deficit hyperactivity disorder, unspecified type: Secondary | ICD-10-CM

## 2014-03-24 MED ORDER — METHYLPHENIDATE HCL 5 MG PO TABS: 15.0000 mg | ORAL_TABLET | Freq: Two times a day (BID) | ORAL | Status: DC

## 2014-03-24 NOTE — Patient Instructions (Signed)
   You received the TDaP  and influenza  vaccines today.  If you develop a sore arm , use tylenol and ibuprofen for a few days   Please make an appt for your physical in february   We will contact you with the bloodwork results

## 2014-03-24 NOTE — Progress Notes (Signed)
Pre-visit discussion using our clinic review tool. No additional management support is needed unless otherwise documented below in the visit note.  

## 2014-03-24 NOTE — Progress Notes (Signed)
Patient ID: Almer Littleton, female   DOB: 1970/10/15, 43 y.o.   MRN: [id number]  Patient Active Problem List   Diagnosis Date Noted  . Routine general medical examination at a health care facility 04/29/2012  . Adult attention deficit disorder   . Abnormal Pap smear of cervix     Subjective:  CC:   Chief Complaint  Patient presents with  . Follow-up    medication refill    HPI:   Dorea Duff is a 43 y.o. female who presents for  Follow up on ADD. Managed with stable doses of ritalin. Patient is doing well on current dose without adverse effects including insomnia. Finishing her work on time,  managing home and work without problems .  Enjoying married life  And the hectic household with 5 children . 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  Medication refill,  Last seen January 2015.  Has gained  Few lbs due to schedule which does not include regular exercise currently,  used to run regularly.  Denies joint pain.    Past Medical History  Diagnosis Date  . Attention deficit disorder of adult without mention of hyperactivity     managed with methylphenidate  . Abnormal Pap smear of cervix 2010    normal biopsy, followup with GYN    No past surgical history on file.     The following portions of the patient's history were reviewed and updated as appropriate: Allergies, current medications, and problem list.    Review of Systems:   Patient denies headache, fevers, malaise, unintentional weight loss, skin rash, eye pain, sinus congestion and sinus pain, sore throat, dysphagia,  hemoptysis , cough, dyspnea, wheezing, chest pain, palpitations, orthopnea, edema, abdominal pain, nausea, melena, diarrhea, constipation, flank pain, dysuria, hematuria, urinary  Frequency, nocturia, numbness, tingling, seizures,  Focal weakness, Loss of consciousness,  Tremor, insomnia, depression, anxiety, and suicidal ideation.      History   Social History  . Marital Status: Legally Separated    Spouse Name: N/A    Number of Children: N/A  . Years of Education: N/A   Occupational History  . attorney    Social History Main Topics  . Smoking status: Never Smoker   . Smokeless tobacco: Never Used  . Alcohol Use: Yes     Comment: wine occasional  . Drug Use: No  . Sexual Activity: Not on file   Other Topics Concern  . Not on file   Social History Narrative    Objective:  Filed Vitals:   03/24/14 1612  BP: 118/78  Pulse: 70  Temp: 98.5 F (36.9 C)  Resp: 16     General appearance: alert, cooperative and appears stated age Ears: normal TM's and external ear canals both ears Throat: lips, mucosa, and tongue normal; teeth and gums normal Neck: no adenopathy, no carotid bruit, supple, symmetrical, trachea midline and thyroid not enlarged, symmetric, no tenderness/mass/nodules Back: symmetric, no curvature. ROM normal. No CVA tenderness. Lungs: clear to auscultation bilaterally Heart: regular rate and rhythm, S1, S2 normal, no murmur, click, rub or gallop Abdomen: soft, non-tender; bowel sounds normal; no masses,  no organomegaly Pulses: 2+ and symmetric Skin: Skin color, texture, turgor normal. No rashes or lesions Lymph nodes: Cervical, supraclavicular, and axillary nodes normal.  Assessment and Plan:  Adult attention deficit disorder Patient has been given refills for Ritalin  for management of ADD for 3 consecutive months and will return in 3 to 6  months.  The risks and benefits of this medication have been reviewed with patient, including drug dependence,  increased risk of suicide , syncope, arrhythmia, sudden death, insomnia .   Updated Medication List Outpatient Encounter Prescriptions as of 03/24/2014  Medication Sig  . fish oil-omega-3 fatty acids 1000 MG capsule Take 2 g by mouth daily.    Marland Kitchen levonorgestrel-ethinyl estradiol (ORSYTHIA) 0.1-20 MG-MCG tablet Take 1 tablet by mouth  daily.  . methylphenidate (RITALIN) 5 MG tablet Take 3 tablets (15 mg total) by mouth 2 (two) times daily.  . [DISCONTINUED] methylphenidate (RITALIN) 5 MG tablet Take 3 tablets (15 mg total) by mouth 2 (two) times daily.  . [DISCONTINUED] methylphenidate (RITALIN) 5 MG tablet Take 3 tablets (15 mg total) by mouth 2 (two) times daily.  . [DISCONTINUED] methylphenidate (RITALIN) 5 MG tablet Take 3 tablets (15 mg total) by mouth 2 (two) times daily.  . Efinaconazole 10 % SOLN Apply 1 drop topically daily.     Orders Placed This Encounter  Procedures  . Tdap vaccine greater than or equal to 7yo IM    No Follow-up on file.

## 2014-03-25 LAB — LIPID PANEL
Cholesterol: 277 mg/dL — ABNORMAL HIGH (ref 0–200)
HDL: 87.2 mg/dL (ref >39.00)
LDL Cholesterol: 177 mg/dL — ABNORMAL HIGH (ref 0–99)
NonHDL: 189.8
Total CHOL/HDL Ratio: 3
Triglycerides: 64 mg/dL (ref 0.0–149.0)
VLDL: 12.8 mg/dL (ref 0.0–40.0)

## 2014-03-25 LAB — COMPREHENSIVE METABOLIC PANEL
ALT: 13 U/L (ref 0–35)
AST: 25 U/L (ref 0–37)
Albumin: 4.7 g/dL (ref 3.5–5.2)
Alkaline Phosphatase: 44 U/L (ref 39–117)
BUN: 11 mg/dL (ref 6–23)
CO2: 19 mEq/L (ref 19–32)
Calcium: 9.5 mg/dL (ref 8.4–10.5)
Chloride: 107 mEq/L (ref 96–112)
Creatinine, Ser: 0.7 mg/dL (ref 0.4–1.2)
GFR: 91.57 mL/min (ref >60.00)
Glucose, Bld: 73 mg/dL (ref 70–99)
Potassium: 4 mEq/L (ref 3.5–5.1)
Sodium: 140 mEq/L (ref 135–145)
Total Bilirubin: 0.8 mg/dL (ref 0.2–1.2)
Total Protein: 7.8 g/dL (ref 6.0–8.3)

## 2014-03-25 LAB — HEPATITIS C ANTIBODY: HCV Ab: NEGATIVE

## 2014-03-25 LAB — TSH: TSH: 1.07 u[IU]/mL (ref 0.35–4.50)

## 2014-03-26 NOTE — Assessment & Plan Note (Signed)
Patient has been given refills for Ritalin  for management of ADD for 3 consecutive months and will return in 3 to 6 months.  The risks and benefits of this medication have been reviewed with patient, including drug dependence,  increased risk of suicide , syncope, arrhythmia, sudden death, insomnia .

## 2014-03-28 ENCOUNTER — Encounter: Payer: Self-pay | Admitting: *Deleted

## 2014-06-17 LAB — HM MAMMOGRAPHY: HM Mammogram: NEGATIVE

## 2014-06-20 ENCOUNTER — Other Ambulatory Visit: Payer: Self-pay | Admitting: Internal Medicine

## 2014-06-20 DIAGNOSIS — F988 Other specified behavioral and emotional disorders with onset usually occurring in childhood and adolescence: Secondary | ICD-10-CM

## 2014-06-20 NOTE — Telephone Encounter (Signed)
Patient called for refill on Ritalin for March , April and MAY ok to fill? Last script stated ok to fill on or after 06/14/14 First script March Que in already.

## 2014-06-23 MED ORDER — METHYLPHENIDATE HCL 5 MG PO TABS: 15.0000 mg | ORAL_TABLET | Freq: Two times a day (BID) | ORAL | Status: DC

## 2014-06-23 NOTE — Telephone Encounter (Signed)
Ok to refill,  printed rx , can print a second one for refill on April 6

## 2014-06-24 NOTE — Telephone Encounter (Signed)
Called [facility] and notified script ready for pick up and placed at front desk.

## 2014-07-18 ENCOUNTER — Other Ambulatory Visit: Payer: Self-pay | Admitting: Internal Medicine

## 2014-07-18 DIAGNOSIS — F988 Other specified behavioral and emotional disorders with onset usually occurring in childhood and adolescence: Secondary | ICD-10-CM

## 2014-07-18 NOTE — Telephone Encounter (Signed)
Patient called to get ritalin scripts for April and May. Patient going out of town , needs by next Tuesday. Pended April.

## 2014-07-22 ENCOUNTER — Other Ambulatory Visit: Payer: Self-pay | Admitting: *Deleted

## 2014-07-22 DIAGNOSIS — F988 Other specified behavioral and emotional disorders with onset usually occurring in childhood and adolescence: Secondary | ICD-10-CM

## 2014-07-22 MED ORDER — METHYLPHENIDATE HCL 5 MG PO TABS: 15.0000 mg | ORAL_TABLET | Freq: Two times a day (BID) | ORAL | Status: DC

## 2014-07-22 NOTE — Telephone Encounter (Signed)
Ok to refill,  printed rx  

## 2014-09-23 ENCOUNTER — Ambulatory Visit (INDEPENDENT_AMBULATORY_CARE_PROVIDER_SITE_OTHER): Payer: 59 | Admitting: Internal Medicine

## 2014-09-23 ENCOUNTER — Telehealth: Payer: Self-pay | Admitting: *Deleted

## 2014-09-23 ENCOUNTER — Encounter: Payer: Self-pay | Admitting: Internal Medicine

## 2014-09-23 VITALS — BP 116/74 | HR 74 | Temp 99.0°F | Wt 123.0 lb

## 2014-09-23 DIAGNOSIS — R05 Cough: Secondary | ICD-10-CM

## 2014-09-23 DIAGNOSIS — J309 Allergic rhinitis, unspecified: Secondary | ICD-10-CM

## 2014-09-23 DIAGNOSIS — R059 Cough, unspecified: Secondary | ICD-10-CM

## 2014-09-23 MED ORDER — PROMETHAZINE-DM 6.25-15 MG/5ML PO SYRP: 5.0000 mL | ORAL_SOLUTION | Freq: Four times a day (QID) | ORAL | Status: DC | PRN

## 2014-09-23 NOTE — Telephone Encounter (Signed)
Pt scheduled 5.25.16 at 8:15am

## 2014-09-23 NOTE — Telephone Encounter (Signed)
Pt called requesting Ritalin refill for June, July, and August.  Pt states she will be in town on Thursday to pick up Rx.  Last OV 11.16.15.  Please advise refill

## 2014-09-23 NOTE — Progress Notes (Signed)
HPI  Pt presents to the clinic today with c/o headache, runny nose, nasal congestion, scratchy throat and cough. This started 2 weeks ago, mainly in her head. She was blowing clear mucous out of her nose. She denies fever, chills or body aches. The symptoms in her head have improved but now she feels like it is draining into her chest. She is coughing up yellow mucous, mostly in the morning. The cough is keeping her awake at night. She has tried Mucinex, Deslym, Vicks Humidifier with some relief. She has no history of allergies or breathing problems. She has had contacts with her husband who has similar symptoms.  Review of Systems    Past Medical History  Diagnosis Date  . Attention deficit disorder of adult without mention of hyperactivity     managed with methylphenidate  . Abnormal Pap smear of cervix 2010    normal biopsy, followup with GYN    Family History  Problem Relation Age of Onset  . Hypertension Mother   . Hyperlipidemia Mother   . Hyperlipidemia Father   . Hypertension Father   . Heart disease Maternal Grandfather   . Cancer Paternal Grandmother 58    melanoma    History   Social History  . Marital Status: Legally Separated    Spouse Name: N/A  . Number of Children: N/A  . Years of Education: N/A   Occupational History  . attorney    Social History Main Topics  . Smoking status: Never Smoker   . Smokeless tobacco: Never Used  . Alcohol Use: Yes     Comment: wine occasional  . Drug Use: No  . Sexual Activity: Not on file   Other Topics Concern  . Not on file   Social History Narrative    Allergies  Allergen Reactions  . Codeine     Headache    Constitutional: Positive headache. Denies fatigue, fever or abrupt weight changes.  HEENT:  Positive nasal congestion and sore throat. Denies eye redness, ear pain, ringing in the ears, wax buildup, runny nose or bloody nose. Respiratory: Positive cough. Denies difficulty breathing or shortness of breath.   Cardiovascular: Denies chest pain, chest tightness, palpitations or swelling in the hands or feet.   No other specific complaints in a complete review of systems (except as listed in HPI above).  Objective:  BP 116/74 mmHg  Pulse 74  Temp(Src) 99 F (37.2 C) (Oral)  Wt 123 lb (55.792 kg)  SpO2 99%  LMP 09/17/2014   General: Appears her stated age, well developed, well nourished in NAD. HEENT: Head: normal shape and size, no sinus tenderness noted; Eyes: sclera white, no icterus, conjunctiva pink; Ears: Tm's gray and intact, normal light reflex; Nose: mucosaboggy and moist, septum midline; Throat/Mouth: + PND. Teeth present, mucosa pink and moist, no exudate noted, no lesions or ulcerations noted.  Neck: No adenopathy. Cardiovascular: Normal rate and rhythm. S1,S2 noted.  No murmur, rubs or gallops noted.  Pulmonary/Chest: Normal effort and positive vesicular breath sounds. No respiratory distress. No wheezes, rales or ronchi noted.      Assessment & Plan:   Allergic Rhinitis:  Can use a Neti Pot which can be purchased from your local drug store. Start taking Zyrtec daily eRx for Dextromethorphan-Promethazine cough syrup for night time  Call me by Friday if worse, will call in antibiotic for you  RTC as needed or if symptoms persist.

## 2014-09-23 NOTE — Patient Instructions (Signed)

## 2014-09-23 NOTE — Telephone Encounter (Signed)
My policy on refills of controlled substanecs is that patients have to be seen every 6 months,  Nothing has changed in regards to my policy,  so you can save [place] both the "back and forth" time by telling patients that  If they have not been seen in 6 months they will have to make an appt .  She can e seen in a 15 minute

## 2014-09-23 NOTE — Progress Notes (Signed)
Pre visit review using our clinic review tool, if applicable. No additional management support is needed unless otherwise documented below in the visit note. 

## 2014-10-01 ENCOUNTER — Ambulatory Visit (INDEPENDENT_AMBULATORY_CARE_PROVIDER_SITE_OTHER): Payer: 59 | Admitting: Internal Medicine

## 2014-10-01 ENCOUNTER — Encounter: Payer: Self-pay | Admitting: Internal Medicine

## 2014-10-01 VITALS — BP 110/78 | HR 75 | Temp 98.1°F | Resp 14 | Ht 65.0 in | Wt 119.8 lb

## 2014-10-01 DIAGNOSIS — F909 Attention-deficit hyperactivity disorder, unspecified type: Secondary | ICD-10-CM

## 2014-10-01 DIAGNOSIS — F900 Attention-deficit hyperactivity disorder, predominantly inattentive type: Secondary | ICD-10-CM | POA: Diagnosis not present

## 2014-10-01 DIAGNOSIS — F988 Other specified behavioral and emotional disorders with onset usually occurring in childhood and adolescence: Secondary | ICD-10-CM

## 2014-10-01 MED ORDER — METHYLPHENIDATE HCL 5 MG PO TABS: 15.0000 mg | ORAL_TABLET | Freq: Two times a day (BID) | ORAL | Status: DC

## 2014-10-01 NOTE — Progress Notes (Signed)
Pre-visit discussion using our clinic review tool. No additional management support is needed unless otherwise documented below in the visit note.  

## 2014-10-01 NOTE — Progress Notes (Signed)
Subjective:  Patient ID: Crystal Reyes, female    DOB: 1971/04/07  Age: 44 y.o. MRN: [id number]  CC: The primary encounter diagnosis was ADD (attention deficit disorder). A diagnosis of Adult attention deficit disorder was also pertinent to this visit.  HPI Crystal Reyes presents for follow up on ADD managed with ritalin.  In the interim she has had her annual exam with  mammogram  and pelvic 3 months ago.   States that intellectually she is doing well .  Occasional insomnia managed with melatonin,.  Went on a no processed food diet for 19 days , lost some weight,  Fell short of thr 30 day plan due ot old habits and hectic schedule.  Father in  [place] has reolcated to [place] from [place] and is spinding more time with patient's children which has been welcomed.     Outpatient Prescriptions Prior to Visit  Medication Sig Dispense Refill  . Efinaconazole 10 % SOLN Apply 1 drop topically daily. 4 mL 11  . fish oil-omega-3 fatty acids 1000 MG capsule Take 2 g by mouth daily.      . methylphenidate (RITALIN) 5 MG tablet Take 3 tablets (15 mg total) by mouth 2 (two) times daily. 180 tablet 0  . levonorgestrel-ethinyl estradiol (ORSYTHIA) 0.1-20 MG-MCG tablet Take 1 tablet by mouth daily.    . promethazine-dextromethorphan (PROMETHAZINE-DM) 6.25-15 MG/5ML syrup Take 5 mLs by mouth 4 (four) times daily as needed for cough. 118 mL 0   No facility-administered medications prior to visit.    Review of Systems;  Patient denies headache, fevers, malaise, unintentional weight loss, skin rash, eye pain, sinus congestion and sinus pain, sore throat, dysphagia,  hemoptysis , cough, dyspnea, wheezing, chest pain, palpitations, orthopnea, edema, abdominal pain, nausea, melena, diarrhea, constipation, flank pain, dysuria, hematuria, urinary  Frequency, nocturia, numbness, tingling, seizures,  Focal weakness, Loss of consciousness,  Tremor, insomnia, depression, anxiety, and suicidal ideation.       Objective:  BP 110/78 mmHg  Pulse 75  Temp(Src) 98.1 F (36.7 C) (Oral)  Resp 14  Ht 5\' 5"  (1.651 m)  Wt 119 lb 12 oz (54.318 kg)  BMI 19.93 kg/m2  SpO2 100%  LMP 09/17/2014  BP Readings from Last 3 Encounters:  10/01/14 110/78  09/23/14 116/74  03/24/14 118/78    Wt Readings from Last 3 Encounters:  10/01/14 119 lb 12 oz (54.318 kg)  09/23/14 123 lb (55.792 kg)  03/24/14 129 lb 8 oz (58.741 kg)    General appearance: alert, cooperative and appears stated age Ears: normal TM's and external ear canals both ears Throat: lips, mucosa, and tongue normal; teeth and gums normal Neck: no adenopathy, no carotid bruit, supple, symmetrical, trachea midline and thyroid not enlarged, symmetric, no tenderness/mass/nodules Back: symmetric, no curvature. ROM normal. No CVA tenderness. Lungs: clear to auscultation bilaterally Heart: regular rate and rhythm, S1, S2 normal, no murmur, click, rub or gallop Abdomen: soft, non-tender; bowel sounds normal; no masses,  no organomegaly Pulses: 2+ and symmetric Skin: Skin color, texture, turgor normal. No rashes or lesions Lymph nodes: Cervical, supraclavicular, and axillary nodes normal. Psych: affect normal, makes good eye contact. No fidgeting,  Smiles easily.  Denies suicidal thoughts     Assessment & Plan:   Problem List Items Addressed This Visit    Adult attention deficit disorder    Patient has been functioning well without side effects on current Ritalin dose.  She was  given refills for Ritalin  for management of ADD  for 3 consecutive months and will return in  6 months.  The risks and benefits of this medication have been reviewed with patient, including drug dependence,  increased risk of suicide , syncope, arrhythmia, sudden death, insomnia .         Other Visit Diagnoses    ADD (attention deficit disorder)    -  Primary    Relevant Medications    methylphenidate (RITALIN) 5 MG tablet       I have discontinued Ms.  Lenny Pastel Reyes's promethazine-dextromethorphan. I am also having her maintain her fish oil-omega-3 fatty acids, levonorgestrel-ethinyl estradiol, Efinaconazole, and methylphenidate.  Meds ordered this encounter  Medications  . DISCONTD: methylphenidate (RITALIN) 5 MG tablet    Sig: Take 3 tablets (15 mg total) by mouth 2 (two) times daily.    Dispense:  180 tablet    Refill:  0    May refill on or afterJune 6, 2016  . DISCONTD: methylphenidate (RITALIN) 5 MG tablet    Sig: Take 3 tablets (15 mg total) by mouth 2 (two) times daily.    Dispense:  180 tablet    Refill:  0    May refill on or after November 12, 2014  . methylphenidate (RITALIN) 5 MG tablet    Sig: Take 3 tablets (15 mg total) by mouth 2 (two) times daily.    Dispense:  180 tablet    Refill:  0    May refill on or after Aug. 6, 2016    Medications Discontinued During This Encounter  Medication Reason  . promethazine-dextromethorphan (PROMETHAZINE-DM) 6.25-15 MG/5ML syrup Patient Preference  . methylphenidate (RITALIN) 5 MG tablet Reorder  . methylphenidate (RITALIN) 5 MG tablet   . methylphenidate (RITALIN) 5 MG tablet Reorder    Follow-up: No Follow-up on file.   Crecencio Mc, MD

## 2014-10-04 NOTE — Assessment & Plan Note (Signed)
Patient has been functioning well without side effects on current Ritalin dose.  She was  given refills for Ritalin  for management of ADD for 3 consecutive months and will return in  6 months.  The risks and benefits of this medication have been reviewed with patient, including drug dependence,  increased risk of suicide , syncope, arrhythmia, sudden death, insomnia .

## 2014-11-20 ENCOUNTER — Encounter: Payer: Self-pay | Admitting: Internal Medicine

## 2014-11-25 ENCOUNTER — Ambulatory Visit
Admission: RE | Admit: 2014-11-25 | Discharge: 2014-11-25 | Disposition: A | Discharge status: Home / Self Care | Payer: 59 | Source: Ambulatory Visit | Attending: Nurse Practitioner | Admitting: Nurse Practitioner

## 2014-11-25 ENCOUNTER — Encounter: Payer: Self-pay | Admitting: Nurse Practitioner

## 2014-11-25 ENCOUNTER — Ambulatory Visit (INDEPENDENT_AMBULATORY_CARE_PROVIDER_SITE_OTHER): Payer: 59 | Admitting: Nurse Practitioner

## 2014-11-25 ENCOUNTER — Other Ambulatory Visit: Payer: Self-pay | Admitting: Nurse Practitioner

## 2014-11-25 VITALS — BP 108/70 | HR 79 | Temp 98.8°F | Resp 16 | Ht 65.0 in | Wt 126.2 lb

## 2014-11-25 DIAGNOSIS — R05 Cough: Secondary | ICD-10-CM | POA: Insufficient documentation

## 2014-11-25 DIAGNOSIS — R059 Cough, unspecified: Secondary | ICD-10-CM | POA: Insufficient documentation

## 2014-11-25 MED ORDER — ALBUTEROL SULFATE HFA 108 (90 BASE) MCG/ACT IN AERS: 2.0000 | INHALATION_SPRAY | Freq: Four times a day (QID) | RESPIRATORY_TRACT | Status: DC | PRN

## 2014-11-25 NOTE — Patient Instructions (Signed)
Please have your chest x-ray done and we will call you with results and next course of action.

## 2014-11-25 NOTE — Progress Notes (Signed)
Pre visit review using our clinic review tool, if applicable. No additional management support is needed unless otherwise documented below in the visit note. 

## 2014-11-25 NOTE — Progress Notes (Signed)
   Subjective:    Patient ID: Crystal Reyes, female    DOB: [birth year], 44 y.o.   MRN: [id number]  HPI  Ms. Crystal Reyes is a 44 yo female with a CC of cough x 2 months.   1) Saw [facility] in May for allergic rhinitis. Mucinex not helpful, non-productive cough, throughout the day randomly and not keeping up at night. Children coughing, fevers in the last 2 months, but have resolved. No other sick contacts. Pt reports she is going to [place] in 2 weeks with some children for a camp and wants to figure out why she is coughing still.   Cough syrup- not helpful except for sleep Cough/cold- somewhat helpful Mucinex- Not helpful  Review of Systems  Constitutional: Negative for fever, chills, diaphoresis and fatigue.  HENT: Negative for congestion, ear discharge, ear pain, postnasal drip, rhinorrhea, sinus pressure, sneezing and sore throat.   Eyes: Negative for visual disturbance.  Respiratory: Positive for cough and chest tightness. Negative for shortness of breath and wheezing.   Cardiovascular: Negative for chest pain, palpitations and leg swelling.  Gastrointestinal: Negative for nausea, vomiting, diarrhea and constipation.  Skin: Negative for rash.  Neurological: Negative for dizziness, weakness, numbness and headaches.      Objective:   Physical Exam  Constitutional: She is oriented to person, place, and time. She appears well-developed and well-nourished. No distress.  BP 108/70 mmHg  Pulse 79  Temp(Src) 98.8 F (37.1 C)  Resp 16  Ht 5\' 5"  (1.651 m)  Wt 126 lb 3.2 oz (57.244 kg)  BMI 21.00 kg/m2  SpO2 95%   HENT:  Head: Normocephalic and atraumatic.  Right Ear: External ear normal.  Left Ear: External ear normal.  Mouth/Throat: Oropharynx is clear and moist.  Eyes: EOM are normal. Pupils are equal, round, and reactive to light. Right eye exhibits no discharge. Left eye exhibits no discharge. No scleral icterus.  Cardiovascular: Normal rate, regular rhythm and normal  heart sounds.  Exam reveals no gallop and no friction rub.   No murmur heard. Pulmonary/Chest: Effort normal and breath sounds normal. No respiratory distress. She has no wheezes. She has no rales. She exhibits no tenderness.  Neurological: She is alert and oriented to person, place, and time. No cranial nerve deficit. She exhibits normal muscle tone. Coordination normal.  Skin: Skin is warm and dry. No rash noted. She is not diaphoretic.  Psychiatric: She has a normal mood and affect. Her behavior is normal. Judgment and thought content normal.      Assessment & Plan:

## 2014-11-25 NOTE — Assessment & Plan Note (Signed)
Dry random cough not resolving. Pt has tried conservative therapy and not helpful in resolving issue. Will obtain Chest x-ray (pt asked if covered by insurance and I informed her we do not know the answer to that). Course of action will be determined by results from x-ray. FU prn worsening/failure to improve.

## 2014-12-29 ENCOUNTER — Other Ambulatory Visit: Payer: Self-pay | Admitting: Internal Medicine

## 2014-12-31 ENCOUNTER — Other Ambulatory Visit: Payer: Self-pay | Admitting: Internal Medicine

## 2014-12-31 DIAGNOSIS — F988 Other specified behavioral and emotional disorders with onset usually occurring in childhood and adolescence: Secondary | ICD-10-CM

## 2015-01-01 ENCOUNTER — Telehealth: Payer: Self-pay | Admitting: *Deleted

## 2015-01-01 DIAGNOSIS — F988 Other specified behavioral and emotional disorders with onset usually occurring in childhood and adolescence: Secondary | ICD-10-CM

## 2015-01-01 MED ORDER — METHYLPHENIDATE HCL 5 MG PO TABS: 15.0000 mg | ORAL_TABLET | Freq: Two times a day (BID) | ORAL | Status: DC

## 2015-01-01 NOTE — Telephone Encounter (Signed)
Left message on VM that Rx ready for pick up.  Placed up front

## 2015-01-01 NOTE — Telephone Encounter (Signed)
90 day supply authorized.  Will need a 6 month follow up in November/December

## 2015-01-01 NOTE — Telephone Encounter (Signed)
Pt called requesting a Methylphenidate refill.  Last OV 5.25.16.  Please advise refill

## 2015-01-19 NOTE — Telephone Encounter (Signed)
Mailed unread message to patient.  

## 2015-03-11 ENCOUNTER — Ambulatory Visit (INDEPENDENT_AMBULATORY_CARE_PROVIDER_SITE_OTHER): Payer: 59 | Admitting: Nurse Practitioner

## 2015-03-11 ENCOUNTER — Ambulatory Visit
Admission: RE | Admit: 2015-03-11 | Discharge: 2015-03-11 | Disposition: A | Discharge status: Home / Self Care | Payer: 59 | Source: Ambulatory Visit | Attending: Nurse Practitioner | Admitting: Nurse Practitioner

## 2015-03-11 ENCOUNTER — Encounter: Payer: Self-pay | Admitting: Nurse Practitioner

## 2015-03-11 VITALS — BP 102/70 | HR 76 | Temp 98.7°F | Resp 14 | Ht 65.0 in | Wt 125.6 lb

## 2015-03-11 DIAGNOSIS — Z23 Encounter for immunization: Secondary | ICD-10-CM

## 2015-03-11 DIAGNOSIS — F988 Other specified behavioral and emotional disorders with onset usually occurring in childhood and adolescence: Secondary | ICD-10-CM

## 2015-03-11 DIAGNOSIS — F909 Attention-deficit hyperactivity disorder, unspecified type: Secondary | ICD-10-CM | POA: Diagnosis not present

## 2015-03-11 DIAGNOSIS — Y9301 Activity, walking, marching and hiking: Secondary | ICD-10-CM | POA: Insufficient documentation

## 2015-03-11 DIAGNOSIS — M79671 Pain in right foot: Secondary | ICD-10-CM | POA: Insufficient documentation

## 2015-03-11 DIAGNOSIS — S9031XA Contusion of right foot, initial encounter: Secondary | ICD-10-CM | POA: Diagnosis present

## 2015-03-11 DIAGNOSIS — F900 Attention-deficit hyperactivity disorder, predominantly inattentive type: Secondary | ICD-10-CM

## 2015-03-11 DIAGNOSIS — X58XXXA Exposure to other specified factors, initial encounter: Secondary | ICD-10-CM | POA: Diagnosis not present

## 2015-03-11 MED ORDER — METHYLPHENIDATE HCL 5 MG PO TABS: 15.0000 mg | ORAL_TABLET | Freq: Two times a day (BID) | ORAL | Status: DC

## 2015-03-11 NOTE — Assessment & Plan Note (Signed)
Patient still functioning well and without side effects. We reviewed risks of Ritalin that were included in the HPI. Last dosage was yesterday, but she ran out Friday. Unable to find her in the [place]. 3 scripts printed, signed, and handed to pt with fill on or after dates for November, December, and Jan (date was Jan 2016, but meant 2017 will fix if pharmacy declines). Asked her to follow up with Dr. Derrel Nip in 3 months for refills and follow up.

## 2015-03-11 NOTE — Progress Notes (Signed)
Patient ID: Crystal Reyes, female    DOB: [birth year]  Age: 44 y.o. MRN: [id number]  CC: Medication Refill and Foot Pain   HPI Crystal Reyes presents for follow up for ADHD medication refill and foot pain.   1) ADHD medication  Last dosage- 1 tablet yesterday  Any concerns with drug dependence, increased risk of suicide, syncope, arrhythmia, sudden death, or insomnia?- Pt denies all and understands risks.  2) Right foot bruised and sore after a 3 mile walk on Sunday. She took her shoe/socks off and noticed swelling, pain, and bruising. She has not been doing any home treatment to date. She reports it has improved, but is still ecchymotic.     History Crystal Reyes has a past medical history of Attention deficit disorder of adult without mention of hyperactivity and Abnormal Pap smear of cervix (2010).   She has no past surgical history on file.   Her family history includes Cancer (age of onset: 49) in her paternal grandmother; Heart disease in her maternal grandfather; Hyperlipidemia in her father and mother; Hypertension in her father and mother.She reports that she has never smoked. She has never used smokeless tobacco. She reports that she drinks alcohol. She reports that she does not use illicit drugs.  Outpatient Prescriptions Prior to Visit  Medication Sig Dispense Refill  . Efinaconazole 10 % SOLN Apply 1 drop topically daily. 4 mL 11  . methylphenidate (RITALIN) 5 MG tablet Take 3 tablets (15 mg total) by mouth 2 (two) times daily. 180 tablet 0  . albuterol (PROAIR HFA) 108 (90 BASE) MCG/ACT inhaler Inhale 2 puffs into the lungs every 6 (six) hours as needed for wheezing or shortness of breath. (Patient not taking: Reported on 03/11/2015) 1 Inhaler 1  . fish oil-omega-3 fatty acids 1000 MG capsule Take 2 g by mouth daily.      Marland Kitchen levonorgestrel-ethinyl estradiol (ORSYTHIA) 0.1-20 MG-MCG tablet Take 1 tablet by mouth daily.     No facility-administered medications  prior to visit.    ROS Review of Systems  Constitutional: Negative for fever, chills, diaphoresis and fatigue.  Respiratory: Negative for chest tightness, shortness of breath and wheezing.   Cardiovascular: Negative for chest pain, palpitations and leg swelling.  Gastrointestinal: Negative for nausea, vomiting and diarrhea.  Skin: Positive for color change. Negative for rash.       Right foot dorsal   Neurological: Negative for dizziness, weakness, numbness and headaches.  Psychiatric/Behavioral: Positive for sleep disturbance and decreased concentration. Negative for suicidal ideas. The patient is not nervous/anxious.        Helped by medication    Objective:  BP 102/70 mmHg  Pulse 76  Temp(Src) 98.7 F (37.1 C)  Resp 14  Ht 5\' 5"  (1.651 m)  Wt 125 lb 9.6 oz (56.972 kg)  BMI 20.90 kg/m2  SpO2 97%  LMP 02/21/2015 (Approximate)  Physical Exam  Constitutional: She is oriented to person, place, and time. She appears well-developed and well-nourished. No distress.  HENT:  Head: Normocephalic and atraumatic.  Right Ear: External ear normal.  Left Ear: External ear normal.  Cardiovascular: Normal rate, regular rhythm, normal heart sounds and intact distal pulses.  Exam reveals no gallop and no friction rub.   No murmur heard. Pulmonary/Chest: Effort normal and breath sounds normal. No respiratory distress. She has no wheezes. She has no rales. She exhibits no tenderness.  Musculoskeletal:  Prior surgery on little toes bilaterally they cross over onto the next toe. Ecchymosis of the dorsal surface  right foot, no tenderness to palpation and ROM.   Neurological: She is alert and oriented to person, place, and time. No cranial nerve deficit. She exhibits normal muscle tone. Coordination normal.  Skin: Skin is warm and dry. No rash noted. She is not diaphoretic.  Psychiatric: She has a normal mood and affect. Her behavior is normal. Judgment and thought content normal.   Assessment &  Plan:   Crystal Reyes was seen today for medication refill and foot pain.  Diagnoses and all orders for this visit:  Right foot pain -     DG Foot Complete Right; Future  ADD (attention deficit disorder) -     Discontinue: methylphenidate (RITALIN) 5 MG tablet; Take 3 tablets (15 mg total) by mouth 2 (two) times daily. -     Discontinue: methylphenidate (RITALIN) 5 MG tablet; Take 3 tablets (15 mg total) by mouth 2 (two) times daily. -     methylphenidate (RITALIN) 5 MG tablet; Take 3 tablets (15 mg total) by mouth 2 (two) times daily.  Adult attention deficit disorder  Other orders -     Flu Vaccine QUAD 36+ mos IM   I am having Crystal Reyes maintain her fish oil-omega-3 fatty acids, levonorgestrel-ethinyl estradiol, Efinaconazole, albuterol, and methylphenidate.  Meds ordered this encounter  Medications  . DISCONTD: methylphenidate (RITALIN) 5 MG tablet    Sig: Take 3 tablets (15 mg total) by mouth 2 (two) times daily.    Dispense:  180 tablet    Refill:  0    May refill on or after Mar 11, 2015    Order Specific Question:  Supervising Provider    Answer:  Deborra Medina L [[id number]]  . DISCONTD: methylphenidate (RITALIN) 5 MG tablet    Sig: Take 3 tablets (15 mg total) by mouth 2 (two) times daily.    Dispense:  180 tablet    Refill:  0    May refill on or after Apr 10, 2015    Order Specific Question:  Supervising Provider    Answer:  Deborra Medina L [[id number]]  . methylphenidate (RITALIN) 5 MG tablet    Sig: Take 3 tablets (15 mg total) by mouth 2 (two) times daily.    Dispense:  180 tablet    Refill:  0    May refill on or after May 10, 2014    Order Specific Question:  Supervising Provider    Answer:  Crecencio Mc [[id number]]     Follow-up: Return in about 3 months (around 06/11/2015) for With Dr. Derrel Nip for ADHD medication renewal.

## 2015-03-11 NOTE — Patient Instructions (Signed)
Thank you for getting your flu vaccine today.   [facility] [address], [place] [address] Hours of Operation Monday - Friday, 8 a.m. - 5 p.m. Main: [phone]   You can walk-in for x-rays at any time above.

## 2015-03-11 NOTE — Progress Notes (Signed)
Pre visit review using our clinic review tool, if applicable. No additional management support is needed unless otherwise documented below in the visit note. 

## 2015-03-11 NOTE — Assessment & Plan Note (Signed)
Right foot pain since Sunday. Will obtain X-ray to verify all is well. Discussed possible next steps. Will follow after x-ray results obtained

## 2015-05-28 ENCOUNTER — Other Ambulatory Visit: Payer: Self-pay | Admitting: Internal Medicine

## 2015-05-28 DIAGNOSIS — F988 Other specified behavioral and emotional disorders with onset usually occurring in childhood and adolescence: Secondary | ICD-10-CM

## 2015-05-28 NOTE — Telephone Encounter (Signed)
Pt called wanting to pick up her prescription for methylphenidate (RITALIN) 5 MG tablet for Feb,March and April. Please call pt when it's ready. Call pt @ [phone]. Pt will be going out of town on next Wednesday. Thank you!

## 2015-05-28 NOTE — Telephone Encounter (Signed)
Please advise refill as requested.  Thanks

## 2015-06-02 ENCOUNTER — Other Ambulatory Visit: Payer: Self-pay

## 2015-06-02 DIAGNOSIS — F988 Other specified behavioral and emotional disorders with onset usually occurring in childhood and adolescence: Secondary | ICD-10-CM

## 2015-06-02 MED ORDER — METHYLPHENIDATE HCL 5 MG PO TABS: 15.0000 mg | ORAL_TABLET | Freq: Two times a day (BID) | ORAL | Status: DC

## 2015-06-02 NOTE — Telephone Encounter (Signed)
Scripts placed at front desk My chart message sent.

## 2015-06-02 NOTE — Telephone Encounter (Signed)
Please advise refill for Feb, March and April.  Thanks

## 2015-06-02 NOTE — Telephone Encounter (Signed)
Refills printed , needs appt in  May with me . please schedule annual

## 2015-08-25 ENCOUNTER — Ambulatory Visit (INDEPENDENT_AMBULATORY_CARE_PROVIDER_SITE_OTHER): Payer: 59 | Admitting: Internal Medicine

## 2015-08-25 ENCOUNTER — Encounter: Payer: Self-pay | Admitting: Internal Medicine

## 2015-08-25 ENCOUNTER — Ambulatory Visit (INDEPENDENT_AMBULATORY_CARE_PROVIDER_SITE_OTHER)
Admission: RE | Admit: 2015-08-25 | Discharge: 2015-08-25 | Disposition: A | Discharge status: Home / Self Care | Payer: 59 | Source: Ambulatory Visit | Attending: Internal Medicine | Admitting: Internal Medicine

## 2015-08-25 VITALS — BP 116/76 | HR 81 | Temp 98.1°F | Resp 12 | Ht 65.0 in | Wt 132.0 lb

## 2015-08-25 DIAGNOSIS — R5383 Other fatigue: Secondary | ICD-10-CM

## 2015-08-25 DIAGNOSIS — M47816 Spondylosis without myelopathy or radiculopathy, lumbar region: Secondary | ICD-10-CM

## 2015-08-25 DIAGNOSIS — M79671 Pain in right foot: Secondary | ICD-10-CM

## 2015-08-25 DIAGNOSIS — M79674 Pain in right toe(s): Secondary | ICD-10-CM | POA: Diagnosis not present

## 2015-08-25 DIAGNOSIS — F988 Other specified behavioral and emotional disorders with onset usually occurring in childhood and adolescence: Secondary | ICD-10-CM

## 2015-08-25 DIAGNOSIS — E785 Hyperlipidemia, unspecified: Secondary | ICD-10-CM

## 2015-08-25 DIAGNOSIS — Z0000 Encounter for general adult medical examination without abnormal findings: Secondary | ICD-10-CM

## 2015-08-25 DIAGNOSIS — E559 Vitamin D deficiency, unspecified: Secondary | ICD-10-CM

## 2015-08-25 DIAGNOSIS — Z113 Encounter for screening for infections with a predominantly sexual mode of transmission: Secondary | ICD-10-CM

## 2015-08-25 DIAGNOSIS — M47814 Spondylosis without myelopathy or radiculopathy, thoracic region: Secondary | ICD-10-CM

## 2015-08-25 LAB — LIPID PANEL
Cholesterol: 216 mg/dL — ABNORMAL HIGH (ref 0–200)
HDL: 81.8 mg/dL (ref >39.00)
LDL Cholesterol: 126 mg/dL — ABNORMAL HIGH (ref 0–99)
NonHDL: 134.32
Total CHOL/HDL Ratio: 3
Triglycerides: 43 mg/dL (ref 0.0–149.0)
VLDL: 8.6 mg/dL (ref 0.0–40.0)

## 2015-08-25 LAB — COMPREHENSIVE METABOLIC PANEL
ALT: 22 U/L (ref 0–35)
AST: 22 U/L (ref 0–37)
Albumin: 4.3 g/dL (ref 3.5–5.2)
Alkaline Phosphatase: 44 U/L (ref 39–117)
BUN: 14 mg/dL (ref 6–23)
CO2: 28 mEq/L (ref 19–32)
Calcium: 9.3 mg/dL (ref 8.4–10.5)
Chloride: 104 mEq/L (ref 96–112)
Creatinine, Ser: 0.71 mg/dL (ref 0.40–1.20)
GFR: 95.4 mL/min (ref >60.00)
Glucose, Bld: 84 mg/dL (ref 70–99)
Potassium: 3.6 mEq/L (ref 3.5–5.1)
Sodium: 137 mEq/L (ref 135–145)
Total Bilirubin: 0.8 mg/dL (ref 0.2–1.2)
Total Protein: 7.1 g/dL (ref 6.0–8.3)

## 2015-08-25 LAB — CBC WITH DIFFERENTIAL/PLATELET
Basophils Absolute: 0.1 10*3/uL (ref 0.0–0.1)
Basophils Relative: 0.9 % (ref 0.0–3.0)
Eosinophils Absolute: 0.3 10*3/uL (ref 0.0–0.7)
Eosinophils Relative: 3.4 % (ref 0.0–5.0)
HCT: 40.1 % (ref 36.0–46.0)
Hemoglobin: 13.5 g/dL (ref 12.0–15.0)
Lymphocytes Relative: 28.4 % (ref 12.0–46.0)
Lymphs Abs: 2.1 10*3/uL (ref 0.7–4.0)
MCHC: 33.6 g/dL (ref 30.0–36.0)
MCV: 88.1 fl (ref 78.0–100.0)
Monocytes Absolute: 0.4 10*3/uL (ref 0.1–1.0)
Monocytes Relative: 6 % (ref 3.0–12.0)
Neutro Abs: 4.6 10*3/uL (ref 1.4–7.7)
Neutrophils Relative %: 61.3 % (ref 43.0–77.0)
Platelets: 217 10*3/uL (ref 150.0–400.0)
RBC: 4.55 Mil/uL (ref 3.87–5.11)
RDW: 12.3 % (ref 11.5–15.5)
WBC: 7.4 10*3/uL (ref 4.0–10.5)

## 2015-08-25 LAB — VITAMIN D 25 HYDROXY (VIT D DEFICIENCY, FRACTURES): VITD: 19.88 ng/mL — ABNORMAL LOW (ref 30.00–100.00)

## 2015-08-25 LAB — TSH: TSH: 0.94 u[IU]/mL (ref 0.35–4.50)

## 2015-08-25 MED ORDER — METHYLPHENIDATE HCL 5 MG PO TABS
15.0000 mg | ORAL_TABLET | Freq: Two times a day (BID) | ORAL | Status: DC
Start: 2015-08-25 — End: 2015-08-25

## 2015-08-25 MED ORDER — METHYLPHENIDATE HCL 5 MG PO TABS
15.0000 mg | ORAL_TABLET | Freq: Two times a day (BID) | ORAL | Status: DC
Start: 2015-08-25 — End: 2015-09-01

## 2015-08-25 MED ORDER — METHYLPHENIDATE HCL 5 MG PO TABS: 15.0000 mg | ORAL_TABLET | Freq: Two times a day (BID) | ORAL | Status: DC

## 2015-08-25 NOTE — Assessment & Plan Note (Addendum)
Patient still functioning well and without side effects. We reviewed risks of Ritalin that were included in the HPI. Marland Kitchen Refill for 3 months given.

## 2015-08-25 NOTE — Progress Notes (Signed)
Patient ID: Crystal Reyes, female    DOB: 1970-12-11  Age: 45 y.o. MRN: [id number]  The patient is here for annual Medicare wellness examination and management of other chronic and acute problems.  Low back issues  seeing [facility] chiropractor since end of February,  Had repeat  X rays    The risk factors are reflected in the social history.  The roster of all physicians providing medical care to patient - is listed in the Snapshot section of the chart.  Activities of daily living:  The patient is 100% independent in all ADLs: dressing, toileting, feeding as well as independent mobility  Home safety : The patient has smoke detectors in the home. They wear seatbelts.  There are no firearms at home. There is no violence in the home.   There is no risks for hepatitis, STDs or HIV. There is no   history of blood transfusion. They have no travel history to infectious disease endemic areas of the world.  The patient has seen their dentist in the last six month. They have seen their eye doctor in the last year. They admit to slight hearing difficulty with regard to whispered voices and some television programs.  They have deferred audiologic testing in the last year.  They do not  have excessive sun exposure. Discussed the need for sun protection: hats, long sleeves and use of sunscreen if there is significant sun exposure.   Diet: the importance of a healthy diet is discussed. They do have a healthy diet.  The benefits of regular aerobic exercise were discussed. She is not exercising regularly and has gained weight.    Depression screen: there are no signs or vegative symptoms of depression- irritability, change in appetite, anhedonia, sadness/tearfullness.  Cognitive assessment: the patient manages all their financial and personal affairs and is actively engaged. They could relate day,date,year and events; recalled 2/3 objects at 3 minutes; performed clock-face test normally.  The  following portions of the patient's history were reviewed and updated as appropriate: allergies, current medications, past family history, past medical history,  past surgical history, past social history  and problem list.  Visual acuity was not assessed per patient preference since she has regular follow up with her ophthalmologist. Hearing and body mass index were assessed and reviewed.   During the course of the visit the patient was educated and counseled about appropriate screening and preventive services including : fall prevention , diabetes screening, nutrition counseling, colorectal cancer screening, and recommended immunizations.    CC: The primary encounter diagnosis was Thoracic spondylosis without myelopathy. Diagnoses of Adult attention deficit disorder, Pain in toe of right foot, Lumbar spondylosis, unspecified spinal osteoarthritis, Other fatigue, Screen for STD (sexually transmitted disease), Vitamin D deficiency, Hyperlipidemia, ADD (attention deficit disorder), Routine general medical examination at a health care facility, and Right foot pain were also pertinent to this visit.  History [place] has a past medical history of Attention deficit disorder of adult without mention of hyperactivity and Abnormal Pap smear of cervix (2010).   She has no past surgical history on file.   Her family history includes Cancer (age of onset: 60) in her paternal grandmother; Heart disease in her maternal grandfather; Hyperlipidemia in her father and mother; Hypertension in her father and mother.She reports that she has never smoked. She has never used smokeless tobacco. She reports that she drinks alcohol. She reports that she does not use illicit drugs.  Outpatient Prescriptions Prior to Visit  Medication Sig Dispense Refill  .  fish oil-omega-3 fatty acids 1000 MG capsule Take 2 g by mouth daily.      . methylphenidate (RITALIN) 5 MG tablet Take 3 tablets (15 mg total) by mouth 2 (two) times  daily. 180 tablet 0  . albuterol (PROAIR HFA) 108 (90 BASE) MCG/ACT inhaler Inhale 2 puffs into the lungs every 6 (six) hours as needed for wheezing or shortness of breath. (Patient not taking: Reported on 03/11/2015) 1 Inhaler 1  . Efinaconazole 10 % SOLN Apply 1 drop topically daily. (Patient not taking: Reported on 08/25/2015) 4 mL 11  . levonorgestrel-ethinyl estradiol (ORSYTHIA) 0.1-20 MG-MCG tablet Take 1 tablet by mouth daily. Reported on 08/25/2015     No facility-administered medications prior to visit.    Review of Systems   Patient denies headache, fevers, malaise, unintentional weight loss, skin rash, eye pain, sinus congestion and sinus pain, sore throat, dysphagia,  hemoptysis , cough, dyspnea, wheezing, chest pain, palpitations, orthopnea, edema, abdominal pain, nausea, melena, diarrhea, constipation, flank pain, dysuria, hematuria, urinary  Frequency, nocturia, numbness, tingling, seizures,  Focal weakness, Loss of consciousness,  Tremor, insomnia, depression, anxiety, and suicidal ideation.      Objective:  BP 116/76 mmHg  Pulse 81  Temp(Src) 98.1 F (36.7 C) (Oral)  Resp 12  Ht 5\' 5"  (1.651 m)  Wt 132 lb (59.875 kg)  BMI 21.97 kg/m2  SpO2 97%  LMP 07/27/2015 (Approximate)  Physical Exam   General appearance: alert, cooperative and appears stated age Ears: normal TM's and external ear canals both ears Throat: lips, mucosa, and tongue normal; teeth and gums normal Neck: no adenopathy, no carotid bruit, supple, symmetrical, trachea midline and thyroid not enlarged, symmetric, no tenderness/mass/nodules Back: symmetric, no curvature. ROM normal. No CVA tenderness. Lungs: clear to auscultation bilaterally Heart: regular rate and rhythm, S1, S2 normal, no murmur, click, rub or gallop Abdomen: soft, non-tender; bowel sounds normal; no masses,  no organomegaly Pulses: 2+ and symmetric Skin: Skin color, texture, turgor normal. No rashes or lesions Lymph nodes: Cervical,  supraclavicular, and axillary nodes normal.:  Ext: right foot with swollen 2nd toe.      Assessment & Plan:   Problem List Items Addressed This Visit    Adult attention deficit disorder    Patient still functioning well and without side effects. We reviewed risks of Ritalin that were included in the HPI. Marland Kitchen Refill for 3 months given.       Routine general medical examination at a health care facility    Annual comprehensive preventive exam was done as well as an evaluation and management of chronic conditions .  During the course of the visit the patient was educated and counseled about appropriate screening and preventive services including :  diabetes screening, lipid analysis with projected  10 year  risk for CAD , nutrition counseling, breast, cervical and colorectal cancer screening, and recommended immunizations.  Printed recommendations for health maintenance screenings was given.  Lab Results  Component Value Date   CHOL 216* 08/25/2015   HDL 81.80 08/25/2015   LDLCALC 126* 08/25/2015   LDLDIRECT 136.8 12/05/2012   TRIG 43.0 08/25/2015   CHOLHDL 3 08/25/2015   Lab Results  Component Value Date   TSH 0.94 08/25/2015   Lab Results  Component Value Date   CREATININE 0.71 08/25/2015         Right foot pain    History of blunt trauma to 2nd toe months ago with persistent swelling and pain. Plain films done today rule out prior fracture.  Thoracic spondylosis without myelopathy - Primary    Managed with chiropractic therapy.       Relevant Orders   Ambulatory referral to Physical Therapy    Other Visit Diagnoses    Pain in toe of right foot        Relevant Orders    DG Foot Complete Right (Completed)    Lumbar spondylosis, unspecified spinal osteoarthritis        Relevant Orders    Ambulatory referral to Physical Therapy    Other fatigue        Relevant Orders    Comprehensive metabolic panel (Completed)    TSH (Completed)    CBC with  Differential/Platelet (Completed)    Screen for STD (sexually transmitted disease)        Relevant Orders    HIV antibody (Completed)    Vitamin D deficiency        Relevant Orders    VITAMIN D 25 Hydroxy (Vit-D Deficiency, Fractures) (Completed)    Hyperlipidemia        Relevant Orders    Lipid panel (Completed)    ADD (attention deficit disorder)        Relevant Medications    methylphenidate (RITALIN) 5 MG tablet    methylphenidate (RITALIN) 5 MG tablet       I am having Ms. Berntson Gilliard maintain her fish oil-omega-3 fatty acids, levonorgestrel-ethinyl estradiol, Efinaconazole, albuterol, fluconazole, methylphenidate, and methylphenidate.  Meds ordered this encounter  Medications  . fluconazole (DIFLUCAN) 200 MG tablet    Sig: Take 1 tablet by mouth once a week.  Marland Kitchen DISCONTD: methylphenidate (RITALIN) 5 MG tablet    Sig: Take 3 tablets (15 mg total) by mouth 2 (two) times daily.    Dispense:  180 tablet    Refill:  0    May refill on or after Sep 08, 2015  . methylphenidate (RITALIN) 5 MG tablet    Sig: Take 3 tablets (15 mg total) by mouth 2 (two) times daily.    Dispense:  180 tablet    Refill:  0    May refill on or after October 09, 2015  . methylphenidate (RITALIN) 5 MG tablet    Sig: Take 3 tablets (15 mg total) by mouth 2 (two) times daily.    Dispense:  180 tablet    Refill:  0    May refill on or after November 08, 2015    Medications Discontinued During This Encounter  Medication Reason  . methylphenidate (RITALIN) 5 MG tablet Reorder  . methylphenidate (RITALIN) 5 MG tablet Reorder  . methylphenidate (RITALIN) 5 MG tablet Reorder    Follow-up: No Follow-up on file.   Crecencio Mc, MD

## 2015-08-25 NOTE — Patient Instructions (Addendum)
You can go get your foot x rayed at [facility] today without an appointment    The  diet I discussed with you today is the 10 day Green Smoothie Cleansing /Detox Diet by Linden Dolin . available on [place] for around $10.  It does require a blender, (Vita Mix, a electric juicer,  Or a Nutribullet Rx).  This is not a low carb or a weight loss diet,  It is fundamentally a "cleansing" low fat diet that eliminates sugar, gluten, caffeine, alcohol and dairy for 10 days .  What you add back after the initial ten days is entirely up to  you!  You can expect to lose 5 to 10 lbs depending on how strict you are.   I found that  drinking 2 smoothies or juices  daily and keeping one chewable meal (but keep it simple, like baked fish and salad, rice or bok choy) kept me satisfied and kept me from straying  .  You snack primarily on fresh  fruit, egg whites and judicious quantities of nuts.  You can add a  vegetable based protein powder  to any smoothie made with almond milk (nothing with whey , since whey is dairy)  [employer] has a few but  the [employer] has the greatest  selection .  Using frozen fruits is much more convenient and cost effective. You can even find plenty of organic fruit in the frozen fruit section of [employer].  Just thaw what you need for the following day the night before in the refrigerator (to avoid jamming up your machine)   The organic vegan protein powder I tried  is called "Vega" and I found it at [employer] .  It is sugar free. Tastes like crap.  My advice:  Sarina Ser your protein  (eat an egg or two in the am with your smoothie or add soy yogurt for protein ) ,  Don't ruin the taste of your smoothies with protein powder unless you can find one you really love.      To make a low carb chip :  Take the [employer] Lavash or Pita bread,  Or the [place] Low carb whole wheat tortilla   Place on metal cookie sheet  Brush with olive oil  Sprinkle garlic powder (NOT garlic salt), grated parmesan  cheese, mediterranean seasoning , or all of them?  Bake at 225 or 250 for 90 minutes   We have substitutions for your potatoes!!  Try the mashed cauliflower and riced cauliflower dishes instead of rice and mashed potatoes  Mashed turnips are also very low carb!   For dessert:  Try the Dannon [employer] Fit greek yogurt dessert flavors and top with reddi Whip .  8 carbs,  80 calories     Try Oikos Triple Zero [place] Yogurt in the salted caramel, and the coffee flavors  With Whipped Cream for dessert

## 2015-08-25 NOTE — Progress Notes (Signed)
Pre-visit discussion using our clinic review tool. No additional management support is needed unless otherwise documented below in the visit note.  

## 2015-08-26 LAB — HIV ANTIBODY (ROUTINE TESTING W REFLEX): HIV 1&2 Ab, 4th Generation: NONREACTIVE

## 2015-08-26 NOTE — Assessment & Plan Note (Signed)
Managed with chiropractic therapy.

## 2015-08-26 NOTE — Assessment & Plan Note (Addendum)
History of blunt trauma to 2nd toe months ago with persistent swelling and pain. Plain films done today rule out prior fracture.

## 2015-08-26 NOTE — Assessment & Plan Note (Addendum)
Annual comprehensive preventive exam was done as well as an evaluation and management of chronic conditions .  During the course of the visit the patient was educated and counseled about appropriate screening and preventive services including :  diabetes screening, lipid analysis with projected  10 year  risk for CAD , nutrition counseling, breast, cervical and colorectal cancer screening, and recommended immunizations.  Printed recommendations for health maintenance screenings was given.  Lab Results  Component Value Date   CHOL 216* 08/25/2015   HDL 81.80 08/25/2015   LDLCALC 126* 08/25/2015   LDLDIRECT 136.8 12/05/2012   TRIG 43.0 08/25/2015   CHOLHDL 3 08/25/2015   Lab Results  Component Value Date   TSH 0.94 08/25/2015   Lab Results  Component Value Date   CREATININE 0.71 08/25/2015

## 2015-08-27 ENCOUNTER — Encounter: Payer: Self-pay | Admitting: Internal Medicine

## 2015-08-27 ENCOUNTER — Telehealth: Payer: Self-pay | Admitting: Internal Medicine

## 2015-08-27 NOTE — Telephone Encounter (Signed)
Pt called to get xray results. Please advise pt.msn

## 2015-08-28 NOTE — Telephone Encounter (Signed)
Sen t buy My chart.

## 2015-08-30 ENCOUNTER — Encounter: Payer: Self-pay | Admitting: Internal Medicine

## 2015-08-30 ENCOUNTER — Other Ambulatory Visit: Payer: Self-pay | Admitting: Internal Medicine

## 2015-08-30 DIAGNOSIS — F988 Other specified behavioral and emotional disorders with onset usually occurring in childhood and adolescence: Secondary | ICD-10-CM

## 2015-08-30 DIAGNOSIS — E559 Vitamin D deficiency, unspecified: Secondary | ICD-10-CM | POA: Insufficient documentation

## 2015-08-30 MED ORDER — ERGOCALCIFEROL 1.25 MG (50000 UT) PO CAPS
50000.0000 [IU] | ORAL_CAPSULE | ORAL | Status: DC
Start: 2015-08-30 — End: 2016-04-09

## 2015-09-01 MED ORDER — METHYLPHENIDATE HCL 5 MG PO TABS: 15.0000 mg | ORAL_TABLET | Freq: Two times a day (BID) | ORAL | Status: DC

## 2015-09-04 ENCOUNTER — Encounter: Payer: 59 | Admitting: Internal Medicine

## 2015-09-14 ENCOUNTER — Encounter: Payer: Self-pay | Admitting: Internal Medicine

## 2015-09-14 ENCOUNTER — Telehealth: Payer: Self-pay | Admitting: Internal Medicine

## 2015-09-14 ENCOUNTER — Ambulatory Visit (INDEPENDENT_AMBULATORY_CARE_PROVIDER_SITE_OTHER): Payer: 59 | Admitting: Internal Medicine

## 2015-09-14 VITALS — BP 112/78 | HR 94 | Temp 98.1°F | Resp 12 | Ht 65.0 in | Wt 133.2 lb

## 2015-09-14 DIAGNOSIS — L0100 Impetigo, unspecified: Secondary | ICD-10-CM

## 2015-09-14 MED ORDER — CEPHALEXIN 500 MG PO CAPS: 500.0000 mg | ORAL_CAPSULE | Freq: Four times a day (QID) | ORAL | Status: DC

## 2015-09-14 NOTE — Progress Notes (Signed)
Pre-visit discussion using our clinic review tool. No additional management support is needed unless otherwise documented below in the visit note.  

## 2015-09-14 NOTE — Progress Notes (Signed)
Subjective:  Patient ID: Crystal Reyes, female    DOB: 02-06-1971  Age: 45 y.o. MRN: [id number]  CC: There were no encounter diagnoses.  HPI Crystal Reyes presents for evaluation and treatment of suspected cellulitis of left forearm.  Patient states that the infection started from a scrat h on her  left forearm. Not sure of the etiology,  because she had done some yardwork (clearing of brush, weeding and planting flowerbeds all within the last 2 weeks )  and  Also some home repairs which included painting her children's bedrooms.   She developed itching and drainage from scratch, several days ago,  And yesterday the forearm became warm, pink,  And swollen as well, so she went to Urgent Care and was given rx for doxycycline 100 mg bid.  Took three doses thus far but overnight the erythema failed to retreat and the area became "bumpy" and intensely itchy despite using Benadryl. She has had some nausea, no fevers,  And has developed several spots on her other arm and chest wall.  No other family members are affected.  .   Outpatient Prescriptions Prior to Visit  Medication Sig Dispense Refill  . ergocalciferol (DRISDOL) 50000 units capsule Take 1 capsule (50,000 Units total) by mouth once a week. 12 capsule 0  . fluconazole (DIFLUCAN) 200 MG tablet Take 1 tablet by mouth once a week.    . methylphenidate (RITALIN) 5 MG tablet Take 3 tablets (15 mg total) by mouth 2 (two) times daily. 180 tablet 0  . methylphenidate (RITALIN) 5 MG tablet Take 3 tablets (15 mg total) by mouth 2 (two) times daily. 180 tablet 0  . albuterol (PROAIR HFA) 108 (90 BASE) MCG/ACT inhaler Inhale 2 puffs into the lungs every 6 (six) hours as needed for wheezing or shortness of breath. (Patient not taking: Reported on 03/11/2015) 1 Inhaler 1  . Efinaconazole 10 % SOLN Apply 1 drop topically daily. (Patient not taking: Reported on 08/25/2015) 4 mL 11  . fish oil-omega-3 fatty acids 1000 MG capsule Take 2 g by  mouth daily. Reported on 09/14/2015    . levonorgestrel-ethinyl estradiol (ORSYTHIA) 0.1-20 MG-MCG tablet Take 1 tablet by mouth daily. Reported on 09/14/2015     No facility-administered medications prior to visit.    Review of Systems;  Patient denies headache, fevers, malaise, unintentional weight loss, skin rash, eye pain, sinus congestion and sinus pain, sore throat, dysphagia,  hemoptysis , cough, dyspnea, wheezing, chest pain, palpitations, orthopnea, edema, abdominal pain, nausea, melena, diarrhea, constipation, flank pain, dysuria, hematuria, urinary  Frequency, nocturia, numbness, tingling, seizures,  Focal weakness, Loss of consciousness,  Tremor, insomnia, depression, anxiety, and suicidal ideation.      Objective:  BP 112/78 mmHg  Pulse 94  Temp(Src) 98.1 F (36.7 C) (Oral)  Resp 12  Ht 5\' 5"  (1.651 m)  Wt 133 lb 4 oz (60.442 kg)  BMI 22.17 kg/m2  SpO2 99%  LMP 07/27/2015 (Approximate)  BP Readings from Last 3 Encounters:  09/14/15 112/78  08/25/15 116/76  03/11/15 102/70    Wt Readings from Last 3 Encounters:  09/14/15 133 lb 4 oz (60.442 kg)  08/25/15 132 lb (59.875 kg)  03/11/15 125 lb 9.6 oz (56.972 kg)    General appearance: alert, cooperative and appears stated age Neck: no adenopathy, no carotid bruit, supple, symmetrical, trachea midline and thyroid not enlarged, symmetric, no tenderness/mass/nodules Back: symmetric, no curvature. ROM normal. No CVA tenderness. Lungs: clear to auscultation bilaterally Heart: regular rate and  rhythm, S1, S2 normal, no murmur, click, rub or gallop Abdomen: soft, non-tender; bowel sounds normal; no masses,  no organomegaly Pulses: 2+ and symmetric Skin: left forearm warm,  1 cm superficial laceration with honey colored crusting and satellite micropapules surrounded by a Pink macular blanching rash from 2 cm below elbow to to wristband.  Lymph nodes: Cervical, supraclavicular, epicondylar and axillary nodes normal.  No  results found for: HGBA1C  Lab Results  Component Value Date   CREATININE 0.71 08/25/2015   CREATININE 0.7 03/24/2014   CREATININE 0.7 12/05/2012    Lab Results  Component Value Date   WBC 7.4 08/25/2015   HGB 13.5 08/25/2015   HCT 40.1 08/25/2015   PLT 217.0 08/25/2015   GLUCOSE 84 08/25/2015   CHOL 216* 08/25/2015   TRIG 43.0 08/25/2015   HDL 81.80 08/25/2015   LDLDIRECT 136.8 12/05/2012   LDLCALC 126* 08/25/2015   ALT 22 08/25/2015   AST 22 08/25/2015   NA 137 08/25/2015   K 3.6 08/25/2015   CL 104 08/25/2015   CREATININE 0.71 08/25/2015   BUN 14 08/25/2015   CO2 28 08/25/2015   TSH 0.94 08/25/2015    Dg Foot Complete Right  08/25/2015  CLINICAL DATA:  Blunt trauma to the second toe in November 2016 ; persistent swelling ; remote history of right foot surgery. EXAM: RIGHT FOOT COMPLETE - 3+ VIEW COMPARISON:  Right foot series of March 11, 2015 FINDINGS: The bones of the second toe are adequately mineralized. There is no periosteal reaction. No abnormal soft tissue calcifications are observed. The other phalanges appear intact. The interphalangeal and metatarsophalangeal joint spaces are preserved. The metatarsals are intact. Two metallic screws traverse the base of the fifth metatarsal extending into the base of the fourth metatarsal. The tarsal bones are intact. IMPRESSION: No acute or chronic bony abnormality of the right second toe is observed. There is no significant osteoarthritic change. There are chronic post fixation changes at the base of the fifth and fourth metatarsals. Electronically Signed   By: David  Martinique M.D.   On: 08/25/2015 14:58    Assessment & Plan:   Problem List Items Addressed This Visit    None      I am having Ms. Berntson Gilliard maintain her fish oil-omega-3 fatty acids, levonorgestrel-ethinyl estradiol, Efinaconazole, albuterol, fluconazole, methylphenidate, ergocalciferol, methylphenidate, doxycycline, and diphenhydrAMINE.  Meds  ordered this encounter  Medications  . doxycycline (VIBRAMYCIN) 100 MG capsule    Sig: Take 1 capsule by mouth 2 (two) times daily.  . diphenhydrAMINE (BENADRYL) 25 mg capsule    Sig: Take 25 mg by mouth 2 (two) times daily.    There are no discontinued medications.  Follow-up: No Follow-up on file.   Crecencio Mc, MD

## 2015-09-14 NOTE — Patient Instructions (Signed)
I am changing your antibiotic from doxycycline to cephalexin, because your rash has spread and the infection looks like impetigo to me.   Impetigo, Adult Impetigo is an infection of the skin. It commonly occurs in young children, but it can also occur in adults. The infection causes itchy blisters and sores that produce brownish-yellow fluid. As the fluid dries, it forms a thick, honey-colored crust. These skin changes usually occur on the face but can also affect other areas of the body. Impetigo usually goes away in 7-10 days with treatment. CAUSES Impetigo is caused by two types of bacteria. It may be caused by staphylococci or streptococci bacteria. These bacteria cause impetigo when they get under the surface of the skin. This often happens after some damage to the skin, such as damage from:  Cuts, scrapes, or scratches.  Insect bites, especially when you scratch the area of a bite.  Chickenpox or other illnesses that cause open skin sores.  Nail biting or chewing. Impetigo is contagious and can spread easily from one person to another. This may occur through close skin contact or by sharing towels, clothing, or other items with a person who has the infection. RISK FACTORS Some things that can increase the risk of getting this infection include:  Playing sports that include skin-to-skin contact with others.  Having a skin condition with open sores.  Having many skin cuts or scrapes.  Living in an area that has high humidity levels.  Having poor hygiene.  Having high levels of staphylococci in your nose. SIGNS AND SYMPTOMS Impetigo usually starts out as small blisters, often on the face. The blisters then break open and turn into tiny sores (lesions) with a yellow crust. In some cases, the blisters cause itching or burning. With scratching, irritation, or lack of treatment, these small lesions may get larger. Scratching can also cause impetigo to spread to other parts of the body.  The bacteria can get under the fingernails and spread when you touch another area of your skin. Other possible symptoms include:  Larger blisters.  Pus.  Swollen lymph glands. DIAGNOSIS This condition is usually diagnosed during a physical exam. A skin sample or sample of fluid from a blister may be taken for lab tests that involve growing bacteria (culture test). This can help confirm the diagnosis or help determine the best treatment. TREATMENT Mild impetigo can be treated with prescription antibiotic cream. Oral antibiotic medicine may be used in more severe cases. Medicines for itching may also be used. HOME CARE INSTRUCTIONS  Take medicines only as directed by your health care provider.  To help prevent impetigo from spreading to other body areas:  Keep your fingernails short and clean.  Do not scratch the blisters or sores.  Cover infected areas, if necessary, to keep from scratching.  Gently wash the infected areas with antibiotic soap and water.  Soak crusted areas in warm, soapy water using antibiotic soap.  Gently rub the areas to remove crusts. Do not scrub.  Wash your hands often to avoid spreading this infection.  Stay home until you have used an antibiotic cream for 48 hours (2 days) or an oral antibiotic medicine for 24 hours (1 day). You should only return to work and activities with other people if your skin shows significant improvement. PREVENTION  To keep the infection from spreading:  Stay home until you have used an antibiotic cream for 48 hours or an oral antibiotic for 24 hours.  Wash your hands often.  Do  not engage in skin-to-skin contact with other people while you have still have blisters.  Do not share towels, washcloths, or bedding with others while you have the infection. SEEK MEDICAL CARE IF:  You develop more blisters or sores despite treatment.  Other family members get sores.  Your skin sores are not improving after 48 hours of  treatment.  You have a fever. SEEK IMMEDIATE MEDICAL CARE IF:  You see spreading redness or swelling of the skin around your sores.  You see red streaks coming from your sores.  You develop a sore throat.   This information is not intended to replace advice given to you by your health care provider. Make sure you discuss any questions you have with your health care provider.   Document Released: 05/16/2014 Document Reviewed: 05/16/2014 Elsevier Interactive Patient Education [employer].

## 2015-09-14 NOTE — Telephone Encounter (Signed)
Pt was at Urgent Care over the weekend. She has questions on the medications she got from there. Pt thinks she is getting hives on the left side of her body from the medicatin. She is on Doxycyclyne.

## 2015-09-14 NOTE — Telephone Encounter (Signed)
Patient went to Urgent care- [facility] in [place].  Scratches on her arms from yard work, diagnosised with cellulitis. Taken three doses of doxycyline, red blotches on left side, breast area, noticed more this am.  One is on her abdomen, left hip, left arm, two on left breast, is concerned that she is having a reaction to the medication Nauseated this am but ate some food areas are rasied, like a welt or hives. Arm is swollen and looks like a rash now not just red.   Very itchy for a few days, draining pus, clear had put neosporin on it and now drainging watery brownish green.  Scheduled for a acute slot with you tonight. thanks

## 2015-09-15 LAB — CBC WITH DIFFERENTIAL/PLATELET
Basophils Absolute: 0 10*3/uL (ref 0.0–0.1)
Basophils Relative: 0.3 % (ref 0.0–3.0)
Eosinophils Absolute: 0.4 10*3/uL (ref 0.0–0.7)
Eosinophils Relative: 5 % (ref 0.0–5.0)
HCT: 40.8 % (ref 36.0–46.0)
Hemoglobin: 13.6 g/dL (ref 12.0–15.0)
Lymphocytes Relative: 21 % (ref 12.0–46.0)
Lymphs Abs: 1.9 10*3/uL (ref 0.7–4.0)
MCHC: 33.2 g/dL (ref 30.0–36.0)
MCV: 88.5 fl (ref 78.0–100.0)
Monocytes Absolute: 0.4 10*3/uL (ref 0.1–1.0)
Monocytes Relative: 4.6 % (ref 3.0–12.0)
Neutro Abs: 6.2 10*3/uL (ref 1.4–7.7)
Neutrophils Relative %: 69.1 % (ref 43.0–77.0)
Platelets: 205 10*3/uL (ref 150.0–400.0)
RBC: 4.61 Mil/uL (ref 3.87–5.11)
RDW: 12.3 % (ref 11.5–15.5)
WBC: 8.9 10*3/uL (ref 4.0–10.5)

## 2015-09-15 NOTE — Assessment & Plan Note (Signed)
Adult.  Changing doxy to keflex 500 mg qid.  Contact precautions advised,  Blood cultures and CBC ordered.  RTC 48 hours.

## 2015-09-16 ENCOUNTER — Encounter: Payer: Self-pay | Admitting: Internal Medicine

## 2015-09-16 ENCOUNTER — Ambulatory Visit (INDEPENDENT_AMBULATORY_CARE_PROVIDER_SITE_OTHER): Payer: 59 | Admitting: Internal Medicine

## 2015-09-16 VITALS — BP 126/72 | HR 78 | Temp 98.3°F | Resp 12 | Ht 65.0 in | Wt 134.5 lb

## 2015-09-16 DIAGNOSIS — L0100 Impetigo, unspecified: Secondary | ICD-10-CM

## 2015-09-16 MED ORDER — PREDNISONE 10 MG PO TABS: ORAL_TABLET | ORAL | Status: DC

## 2015-09-16 NOTE — Patient Instructions (Addendum)
We are adding a Prednisone  Taper to help with the itching and inflammation  :  6 tablets on Day 1 , then reduce by 1 tablet daily until gone    Continue taking the  keflex  Until  It is gone   Aeronautical engineer for your daily probiotic  And take align or another generic alternative for 3 weeks to avoid diarrhea

## 2015-09-16 NOTE — Progress Notes (Signed)
Pre-visit discussion using our clinic review tool. No additional management support is needed unless otherwise documented below in the visit note.  

## 2015-09-16 NOTE — Progress Notes (Signed)
Subjective:  Patient ID: Crystal Reyes, female    DOB: Dec 26, 1970  Age: 45 y.o. MRN: [id number]  CC: The encounter diagnosis was Impetigo.  HPI [name] presents for follow up on cellulitis  Of left forearm secondary to impetigo.  She was seen two days ago and her antibiotics were changed from doxycycline to Keflex .  She notes improvement in the redness surrounding the scratch on her left arm.  The itching has improved somewhat.  She denies fevers .          Outpatient Prescriptions Prior to Visit  Medication Sig Dispense Refill  . albuterol (PROAIR HFA) 108 (90 BASE) MCG/ACT inhaler Inhale 2 puffs into the lungs every 6 (six) hours as needed for wheezing or shortness of breath. 1 Inhaler 1  . cephALEXin (KEFLEX) 500 MG capsule Take 1 capsule (500 mg total) by mouth 4 (four) times daily. 28 capsule 0  . diphenhydrAMINE (BENADRYL) 25 mg capsule Take 25 mg by mouth 2 (two) times daily.    . Efinaconazole 10 % SOLN Apply 1 drop topically daily. 4 mL 11  . ergocalciferol (DRISDOL) 50000 units capsule Take 1 capsule (50,000 Units total) by mouth once a week. 12 capsule 0  . fish oil-omega-3 fatty acids 1000 MG capsule Take 2 g by mouth daily. Reported on 09/14/2015    . fluconazole (DIFLUCAN) 200 MG tablet Take 1 tablet by mouth once a week.    Marland Kitchen levonorgestrel-ethinyl estradiol (ORSYTHIA) 0.1-20 MG-MCG tablet Take 1 tablet by mouth daily. Reported on 09/14/2015    . methylphenidate (RITALIN) 5 MG tablet Take 3 tablets (15 mg total) by mouth 2 (two) times daily. 180 tablet 0  . methylphenidate (RITALIN) 5 MG tablet Take 3 tablets (15 mg total) by mouth 2 (two) times daily. 180 tablet 0  . doxycycline (VIBRAMYCIN) 100 MG capsule Take 1 capsule by mouth 2 (two) times daily. Reported on 09/16/2015     No facility-administered medications prior to visit.    Review of Systems;  Patient denies headache, fevers, malaise, unintentional weight loss, skin rash, eye pain, sinus  congestion and sinus pain, sore throat, dysphagia,  hemoptysis , cough, dyspnea, wheezing, chest pain, palpitations, orthopnea, edema, abdominal pain, nausea, melena, diarrhea, constipation, flank pain, dysuria, hematuria, urinary  Frequency, nocturia, numbness, tingling, seizures,  Focal weakness, Loss of consciousness,  Tremor, insomnia, depression, anxiety, and suicidal ideation.      Objective:  BP 126/72 mmHg  Pulse 78  Temp(Src) 98.3 F (36.8 C) (Oral)  Resp 12  Ht 5\' 5"  (1.651 m)  Wt 134 lb 8 oz (61.009 kg)  BMI 22.38 kg/m2  SpO2 98%  LMP 08/31/2015  BP Readings from Last 3 Encounters:  09/16/15 126/72  09/14/15 112/78  08/25/15 116/76    Wt Readings from Last 3 Encounters:  09/16/15 134 lb 8 oz (61.009 kg)  09/14/15 133 lb 4 oz (60.442 kg)  08/25/15 132 lb (59.875 kg)    General appearance: alert, cooperative and appears stated age Skin: left forearm with resolving erythema.  Honey crusted discharge surrounding a superficial laceration  Lymph nodes: Cervical, supraclavicular, and axillary nodes normal.  No results found for: HGBA1C  Lab Results  Component Value Date   CREATININE 0.71 08/25/2015   CREATININE 0.7 03/24/2014   CREATININE 0.7 12/05/2012    Lab Results  Component Value Date   WBC 8.9 09/14/2015   HGB 13.6 09/14/2015   HCT 40.8 09/14/2015   PLT 205.0 09/14/2015   GLUCOSE 84  08/25/2015   CHOL 216* 08/25/2015   TRIG 43.0 08/25/2015   HDL 81.80 08/25/2015   LDLDIRECT 136.8 12/05/2012   LDLCALC 126* 08/25/2015   ALT 22 08/25/2015   AST 22 08/25/2015   NA 137 08/25/2015   K 3.6 08/25/2015   CL 104 08/25/2015   CREATININE 0.71 08/25/2015   BUN 14 08/25/2015   CO2 28 08/25/2015   TSH 0.94 08/25/2015    Dg Foot Complete Right  08/25/2015  CLINICAL DATA:  Blunt trauma to the second toe in November 2016 ; persistent swelling ; remote history of right foot surgery. EXAM: RIGHT FOOT COMPLETE - 3+ VIEW COMPARISON:  Right foot series of March 11, 2015 FINDINGS: The bones of the second toe are adequately mineralized. There is no periosteal reaction. No abnormal soft tissue calcifications are observed. The other phalanges appear intact. The interphalangeal and metatarsophalangeal joint spaces are preserved. The metatarsals are intact. Two metallic screws traverse the base of the fifth metatarsal extending into the base of the fourth metatarsal. The tarsal bones are intact. IMPRESSION: No acute or chronic bony abnormality of the right second toe is observed. There is no significant osteoarthritic change. There are chronic post fixation changes at the base of the fifth and fourth metatarsals. Electronically Signed   By: David  Martinique M.D.   On: 08/25/2015 14:58    Assessment & Plan:   Problem List Items Addressed This Visit    Impetigo - Primary    Continue keflex x 1 week.  Adding prednisone  taper for the inflammation          I am having Ms. Berntson Gilliard start on predniSONE. I am also having her maintain her fish oil-omega-3 fatty acids, levonorgestrel-ethinyl estradiol, Efinaconazole, albuterol, fluconazole, methylphenidate, ergocalciferol, methylphenidate, doxycycline, diphenhydrAMINE, and cephALEXin.  Meds ordered this encounter  Medications  . predniSONE (DELTASONE) 10 MG tablet    Sig: 6 tablets on Day 1 , then reduce by 1 tablet daily until gone    Dispense:  21 tablet    Refill:  0    PLEASE DO NOT USE THE DREADED PILL PACK  TOO HARD TO OPEN!!!!    There are no discontinued medications.  Follow-up: No Follow-up on file.   Crecencio Mc, MD

## 2015-09-18 NOTE — Assessment & Plan Note (Addendum)
Continue keflex x 1 week.  Adding prednisone  taper for the inflammation

## 2015-09-22 LAB — CULTURE, BLOOD (SINGLE): Organism ID, Bacteria: NO GROWTH

## 2015-09-28 ENCOUNTER — Other Ambulatory Visit: Payer: Self-pay | Admitting: Internal Medicine

## 2015-09-28 ENCOUNTER — Encounter: Payer: Self-pay | Admitting: Internal Medicine

## 2015-09-28 MED ORDER — PREDNISONE 10 MG PO TABS: ORAL_TABLET | ORAL | Status: DC

## 2015-11-02 ENCOUNTER — Telehealth: Payer: Self-pay | Admitting: *Deleted

## 2015-11-02 DIAGNOSIS — F988 Other specified behavioral and emotional disorders with onset usually occurring in childhood and adolescence: Secondary | ICD-10-CM

## 2015-11-02 MED ORDER — METHYLPHENIDATE HCL 5 MG PO TABS
15.0000 mg | ORAL_TABLET | Freq: Two times a day (BID) | ORAL | Status: DC
Start: 2015-11-02 — End: 2015-11-03

## 2015-11-02 NOTE — Telephone Encounter (Signed)
Rx given to PCP for signature.

## 2015-11-02 NOTE — Telephone Encounter (Signed)
Patient requested a medication refill for methylphenidate

## 2015-11-03 MED ORDER — METHYLPHENIDATE HCL 5 MG PO TABS: 15.0000 mg | ORAL_TABLET | Freq: Two times a day (BID) | ORAL | Status: DC

## 2015-11-03 NOTE — Telephone Encounter (Signed)
I  Reviewed chart personally.  She WAS given refills on April 28th for May, June and July,  so the one we created yesterday for June  needs to be  destroyed.  You may print out  refills for August sept and October.

## 2015-11-03 NOTE — Telephone Encounter (Signed)
Notified patient that June script ready for pick up May and July Script found in chart.  Placed June at front desk.

## 2015-11-03 NOTE — Telephone Encounter (Signed)
Patient called back original message was not taken clearly patient was wanting to know if she could pick up scripts for Aug, Sept, and Oct since husband would be in office this afternoon. Ok to do ?

## 2015-11-03 NOTE — Telephone Encounter (Signed)
Refills printed.  

## 2015-11-03 NOTE — Addendum Note (Signed)
Addended by: Nanci Pina on: 11/03/2015 10:08 AM   Modules accepted: Orders

## 2016-02-05 ENCOUNTER — Other Ambulatory Visit: Payer: Self-pay | Admitting: Obstetrics and Gynecology

## 2016-02-10 ENCOUNTER — Other Ambulatory Visit: Payer: Self-pay | Admitting: Internal Medicine

## 2016-02-10 DIAGNOSIS — F988 Other specified behavioral and emotional disorders with onset usually occurring in childhood and adolescence: Secondary | ICD-10-CM

## 2016-02-12 MED ORDER — METHYLPHENIDATE HCL 5 MG PO TABS: 15.0000 mg | ORAL_TABLET | Freq: Two times a day (BID) | ORAL | 0 refills | Status: DC

## 2016-02-12 NOTE — Telephone Encounter (Signed)
Refill for 30 days only.  OFFICE VISIT NEEDED prior to any more refills 

## 2016-02-12 NOTE — Telephone Encounter (Signed)
Last sent in 11/03/15. Last seen 08/25/15. No follow up visit on file.

## 2016-02-18 ENCOUNTER — Other Ambulatory Visit: Payer: Self-pay | Admitting: *Deleted

## 2016-02-18 MED ORDER — METHYLPHENIDATE HCL 5 MG PO TABS: 15.0000 mg | ORAL_TABLET | Freq: Two times a day (BID) | ORAL | 0 refills | Status: DC

## 2016-02-18 NOTE — Progress Notes (Unsigned)
Re-printed RX patient aware to pick up placed at front desk.

## 2016-04-06 ENCOUNTER — Ambulatory Visit (INDEPENDENT_AMBULATORY_CARE_PROVIDER_SITE_OTHER): Payer: 59 | Admitting: Internal Medicine

## 2016-04-06 VITALS — BP 124/76 | HR 79 | Temp 97.6°F | Resp 12 | Ht 65.0 in | Wt 133.5 lb

## 2016-04-06 DIAGNOSIS — E559 Vitamin D deficiency, unspecified: Secondary | ICD-10-CM | POA: Diagnosis not present

## 2016-04-06 DIAGNOSIS — F902 Attention-deficit hyperactivity disorder, combined type: Secondary | ICD-10-CM

## 2016-04-06 DIAGNOSIS — F988 Other specified behavioral and emotional disorders with onset usually occurring in childhood and adolescence: Secondary | ICD-10-CM | POA: Diagnosis not present

## 2016-04-06 DIAGNOSIS — R5383 Other fatigue: Secondary | ICD-10-CM

## 2016-04-06 LAB — COMPREHENSIVE METABOLIC PANEL
ALT: 7 U/L (ref 0–35)
AST: 14 U/L (ref 0–37)
Albumin: 4.2 g/dL (ref 3.5–5.2)
Alkaline Phosphatase: 46 U/L (ref 39–117)
BUN: 16 mg/dL (ref 6–23)
CO2: 29 mEq/L (ref 19–32)
Calcium: 9 mg/dL (ref 8.4–10.5)
Chloride: 105 mEq/L (ref 96–112)
Creatinine, Ser: 0.74 mg/dL (ref 0.40–1.20)
GFR: 90.69 mL/min (ref >60.00)
Glucose, Bld: 91 mg/dL (ref 70–99)
Potassium: 4 mEq/L (ref 3.5–5.1)
Sodium: 140 mEq/L (ref 135–145)
Total Bilirubin: 0.5 mg/dL (ref 0.2–1.2)
Total Protein: 6.5 g/dL (ref 6.0–8.3)

## 2016-04-06 LAB — VITAMIN D 25 HYDROXY (VIT D DEFICIENCY, FRACTURES): VITD: 21.04 ng/mL — ABNORMAL LOW (ref 30.00–100.00)

## 2016-04-06 MED ORDER — METHYLPHENIDATE HCL 5 MG PO TABS: 15.0000 mg | ORAL_TABLET | Freq: Two times a day (BID) | ORAL | 0 refills | Status: DC

## 2016-04-06 NOTE — Progress Notes (Signed)
Subjective:  Patient ID: Crystal Reyes, female    DOB: [birth year]  Age: 45 y.o. MRN: [id number]  CC: The primary encounter diagnosis was Vitamin D deficiency. Diagnoses of Fatigue, unspecified type, Attention deficit hyperactivity disorder (ADHD), combined type, and Adult attention deficit disorder were also pertinent to this visit.  medica Crystal Reyes presents for  Medication refill . 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  Denies side effects including tachycardia, nervousness and insomnia.However, she feels that her current Medication seems to be wearing off after 4 hours so she has started taking it in divided doses.   8-9    Outpatient Medications Prior to Visit  Medication Sig Dispense Refill  . albuterol (PROAIR HFA) 108 (90 BASE) MCG/ACT inhaler Inhale 2 puffs into the lungs every 6 (six) hours as needed for wheezing or shortness of breath. 1 Inhaler 1  . Efinaconazole 10 % SOLN Apply 1 drop topically daily. 4 mL 11  . fish oil-omega-3 fatty acids 1000 MG capsule Take 2 g by mouth daily. Reported on 09/14/2015    . diphenhydrAMINE (BENADRYL) 25 mg capsule Take 25 mg by mouth 2 (two) times daily.    Marland Kitchen levonorgestrel-ethinyl estradiol (ORSYTHIA) 0.1-20 MG-MCG tablet Take 1 tablet by mouth daily. Reported on 09/14/2015    . methylphenidate (RITALIN) 5 MG tablet Take 3 tablets (15 mg total) by mouth 2 (two) times daily. 180 tablet 0  . methylphenidate (RITALIN) 5 MG tablet Take 3 tablets (15 mg total) by mouth 2 (two) times daily. 180 tablet 0  . cephALEXin (KEFLEX) 500 MG capsule Take 1 capsule (500 mg total) by mouth 4 (four) times daily. 28 capsule 0  . doxycycline (VIBRAMYCIN) 100 MG capsule Take 1 capsule by mouth 2 (two) times daily. Reported on 09/16/2015    . ergocalciferol (DRISDOL) 50000 units capsule Take 1 capsule (50,000 Units total) by mouth once a week. (Patient not taking:  Reported on 04/06/2016) 12 capsule 0  . fluconazole (DIFLUCAN) 200 MG tablet Take 1 tablet by mouth once a week.    . predniSONE (DELTASONE) 10 MG tablet 6 tablets on Day 1 , then reduce by 1 tablet daily until gone 21 tablet 0  . predniSONE (DELTASONE) 10 MG tablet 6 tablets on Day 1 , then reduce by 1 tablet daily until gone 21 tablet 0   No facility-administered medications prior to visit.     Review of Systems;  Patient denies headache, fevers, malaise, unintentional weight loss, skin rash, eye pain, sinus congestion and sinus pain, sore throat, dysphagia,  hemoptysis , cough, dyspnea, wheezing, chest pain, palpitations, orthopnea, edema, abdominal pain, nausea, melena, diarrhea, constipation, flank pain, dysuria, hematuria, urinary  Frequency, nocturia, numbness, tingling, seizures,  Focal weakness, Loss of consciousness,  Tremor, insomnia, depression, anxiety, and suicidal ideation.      Objective:  BP 124/76   Pulse 79   Temp 97.6 F (36.4 C) (Oral)   Resp 12   Ht 5\' 5"  (1.651 m)   Wt 133 lb 8 oz (60.6 kg)   LMP 03/06/2016 (Approximate)   SpO2 100%   BMI 22.22 kg/m   BP Readings from Last 3 Encounters:  04/06/16 124/76  09/16/15 126/72  09/14/15 112/78    Wt Readings from Last 3 Encounters:  04/06/16 133 lb 8 oz (60.6 kg)  09/16/15 134 lb 8 oz (61 kg)  09/14/15 133 lb 4 oz (60.4 kg)  General appearance: alert, cooperative and appears stated age Ears: normal TM's and external ear canals both ears Throat: lips, mucosa, and tongue normal; teeth and gums normal Neck: no adenopathy, no carotid bruit, supple, symmetrical, trachea midline and thyroid not enlarged, symmetric, no tenderness/mass/nodules Back: symmetric, no curvature. ROM normal. No CVA tenderness. Lungs: clear to auscultation bilaterally Heart: regular rate and rhythm, S1, S2 normal, no murmur, click, rub or gallop Abdomen: soft, non-tender; bowel sounds normal; no masses,  no organomegaly Pulses: 2+  and symmetric Skin: Skin color, texture, turgor normal. No rashes or lesions Lymph nodes: Cervical, supraclavicular, and axillary nodes normal.  No results found for: HGBA1C  Lab Results  Component Value Date   CREATININE 0.74 04/06/2016   CREATININE 0.71 08/25/2015   CREATININE 0.7 03/24/2014    Lab Results  Component Value Date   WBC 8.9 09/14/2015   HGB 13.6 09/14/2015   HCT 40.8 09/14/2015   PLT 205.0 09/14/2015   GLUCOSE 91 04/06/2016   CHOL 216 (H) 08/25/2015   TRIG 43.0 08/25/2015   HDL 81.80 08/25/2015   LDLDIRECT 136.8 12/05/2012   LDLCALC 126 (H) 08/25/2015   ALT 7 04/06/2016   AST 14 04/06/2016   NA 140 04/06/2016   K 4.0 04/06/2016   CL 105 04/06/2016   CREATININE 0.74 04/06/2016   BUN 16 04/06/2016   CO2 29 04/06/2016   TSH 0.94 08/25/2015    Dg Foot Complete Right  Result Date: 08/25/2015 CLINICAL DATA:  Blunt trauma to the second toe in November 2016 ; persistent swelling ; remote history of right foot surgery. EXAM: RIGHT FOOT COMPLETE - 3+ VIEW COMPARISON:  Right foot series of March 11, 2015 FINDINGS: The bones of the second toe are adequately mineralized. There is no periosteal reaction. No abnormal soft tissue calcifications are observed. The other phalanges appear intact. The interphalangeal and metatarsophalangeal joint spaces are preserved. The metatarsals are intact. Two metallic screws traverse the base of the fifth metatarsal extending into the base of the fourth metatarsal. The tarsal bones are intact. IMPRESSION: No acute or chronic bony abnormality of the right second toe is observed. There is no significant osteoarthritic change. There are chronic post fixation changes at the base of the fifth and fourth metatarsals. Electronically Signed   By: David  Martinique M.D.   On: 08/25/2015 14:58    Assessment & Plan:   Problem List Items Addressed This Visit    Adult attention deficit disorder    Patient still functioning well and without side  effects on current dose of 15 mg twiwce daily . We reviewed risks of Ritalin that were included in the HPI. Marland Kitchen Refill for 3 months given.       Vitamin D deficiency - Primary    Recurrent,  Vit d 50,000 units weekly refilled for 4 months      Relevant Orders   VITAMIN D 25 Hydroxy (Vit-D Deficiency, Fractures) (Completed)    Other Visit Diagnoses    Fatigue, unspecified type       Relevant Orders   Comprehensive metabolic panel (Completed)   Attention deficit hyperactivity disorder (ADHD), combined type       Relevant Medications   methylphenidate (RITALIN) 5 MG tablet   methylphenidate (RITALIN) 5 MG tablet      I have discontinued Ms. Lenny Pastel Gilliard's levonorgestrel-ethinyl estradiol, fluconazole, doxycycline, diphenhydrAMINE, cephALEXin, predniSONE, and predniSONE. I am also having her maintain her fish oil-omega-3 fatty acids, Efinaconazole, albuterol, methylphenidate, methylphenidate, methylphenidate, and ergocalciferol.  Meds ordered this  encounter  Medications  . methylphenidate (RITALIN) 5 MG tablet    Sig: Take 3 tablets (15 mg total) by mouth 2 (two) times daily.    Dispense:  180 tablet    Refill:  0    May refill on or after May 10 2016  . methylphenidate (RITALIN) 5 MG tablet    Sig: Take 3 tablets (15 mg total) by mouth 2 (two) times daily.    Dispense:  180 tablet    Refill:  0    May refill on or after Apr 09 2016  . methylphenidate (RITALIN) 5 MG tablet    Sig: Take 3 tablets (15 mg total) by mouth 2 (two) times daily.    Dispense:  180 tablet    Refill:  0    May refill on or after June 10, 2016  . ergocalciferol (DRISDOL) 50000 units capsule    Sig: Take 1 capsule (50,000 Units total) by mouth once a week.    Dispense:  4 capsule    Refill:  3    Medications Discontinued During This Encounter  Medication Reason  . cephALEXin (KEFLEX) 500 MG capsule Completed Course  . doxycycline (VIBRAMYCIN) 100 MG capsule Completed Course  . fluconazole  (DIFLUCAN) 200 MG tablet Completed Course  . predniSONE (DELTASONE) 10 MG tablet Error  . predniSONE (DELTASONE) 10 MG tablet Completed Course  . levonorgestrel-ethinyl estradiol (ORSYTHIA) 0.1-20 MG-MCG tablet   . diphenhydrAMINE (BENADRYL) 25 mg capsule   . methylphenidate (RITALIN) 5 MG tablet Reorder  . methylphenidate (RITALIN) 5 MG tablet Reorder  . ergocalciferol (DRISDOL) 50000 units capsule Reorder    Follow-up: Return in about 3 months (around 07/06/2016), or med refill .   Crecencio Mc, MD

## 2016-04-06 NOTE — Patient Instructions (Signed)
Good to see you!   Let me know if the new regimen of ritalin stops providing the benefit you need

## 2016-04-06 NOTE — Progress Notes (Signed)
Pre-visit discussion using our clinic review tool. No additional management support is needed unless otherwise documented below in the visit note.  

## 2016-04-09 MED ORDER — ERGOCALCIFEROL 1.25 MG (50000 UT) PO CAPS: 50000.0000 [IU] | ORAL_CAPSULE | ORAL | 3 refills | Status: DC

## 2016-04-09 NOTE — Assessment & Plan Note (Signed)
Patient still functioning well and without side effects on current dose of 15 mg twiwce daily . We reviewed risks of Ritalin that were included in the HPI. Marland Kitchen Refill for 3 months given.

## 2016-04-09 NOTE — Assessment & Plan Note (Signed)
Recurrent,  Vit d 50,000 units weekly refilled for 4 months

## 2016-05-30 ENCOUNTER — Other Ambulatory Visit: Payer: Self-pay | Admitting: Obstetrics and Gynecology

## 2016-05-30 DIAGNOSIS — Z1231 Encounter for screening mammogram for malignant neoplasm of breast: Secondary | ICD-10-CM

## 2016-06-21 ENCOUNTER — Ambulatory Visit: Payer: 59

## 2016-06-24 DIAGNOSIS — L821 Other seborrheic keratosis: Secondary | ICD-10-CM | POA: Diagnosis not present

## 2016-07-05 ENCOUNTER — Ambulatory Visit (INDEPENDENT_AMBULATORY_CARE_PROVIDER_SITE_OTHER): Payer: 59 | Admitting: Internal Medicine

## 2016-07-05 ENCOUNTER — Encounter: Payer: Self-pay | Admitting: Internal Medicine

## 2016-07-05 VITALS — BP 110/80 | HR 91 | Resp 16 | Wt 139.0 lb

## 2016-07-05 DIAGNOSIS — E7800 Pure hypercholesterolemia, unspecified: Secondary | ICD-10-CM | POA: Diagnosis not present

## 2016-07-05 DIAGNOSIS — R5383 Other fatigue: Secondary | ICD-10-CM | POA: Diagnosis not present

## 2016-07-05 DIAGNOSIS — F902 Attention-deficit hyperactivity disorder, combined type: Secondary | ICD-10-CM

## 2016-07-05 DIAGNOSIS — F988 Other specified behavioral and emotional disorders with onset usually occurring in childhood and adolescence: Secondary | ICD-10-CM

## 2016-07-05 DIAGNOSIS — E559 Vitamin D deficiency, unspecified: Secondary | ICD-10-CM

## 2016-07-05 DIAGNOSIS — R635 Abnormal weight gain: Secondary | ICD-10-CM

## 2016-07-05 MED ORDER — METHYLPHENIDATE HCL 5 MG PO TABS: 15.0000 mg | ORAL_TABLET | Freq: Two times a day (BID) | ORAL | 0 refills | Status: DC

## 2016-07-05 NOTE — Progress Notes (Signed)
Pre visit review using our clinic review tool, if applicable. No additional management support is needed unless otherwise documented below in the visit note. 

## 2016-07-05 NOTE — Progress Notes (Signed)
Subjective:  Patient ID: Crystal Reyes, female    DOB: 06/27/1970  Age: 46 y.o. MRN: [id number]  CC: The primary encounter diagnosis was Weight gain, abnormal. Diagnoses of Attention deficit hyperactivity disorder (ADHD), combined type, Fatigue, unspecified type, Pure hypercholesterolemia, Vitamin D deficiency, Adult attention deficit disorder, and Weight gain were also pertinent to this visit.  HPI Crystal Reyes presents for follow up on ADHD Patient has been taking the medication as prescribed and functioining well both at work and at home.  Denies side effects including tachycardia, nervousness and insomnia.  Has gained 15 lbs since nov 2016 . Not exercising regularly due to work schedule.   Annual physical due in mid April  mammogram is scheduled for Friday   PAPs done every other year by gyn due to abnormal history,  Last abnormal was HPV positve , had colposcopy 8 years ago (2011)     Outpatient Medications Prior to Visit  Medication Sig Dispense Refill  . albuterol (PROAIR HFA) 108 (90 BASE) MCG/ACT inhaler Inhale 2 puffs into the lungs every 6 (six) hours as needed for wheezing or shortness of breath. 1 Inhaler 1  . Efinaconazole 10 % SOLN Apply 1 drop topically daily. 4 mL 11  . ergocalciferol (DRISDOL) 50000 units capsule Take 1 capsule (50,000 Units total) by mouth once a week. 4 capsule 3  . fish oil-omega-3 fatty acids 1000 MG capsule Take 2 g by mouth daily. Reported on 09/14/2015    . methylphenidate (RITALIN) 5 MG tablet Take 3 tablets (15 mg total) by mouth 2 (two) times daily. 180 tablet 0  . methylphenidate (RITALIN) 5 MG tablet Take 3 tablets (15 mg total) by mouth 2 (two) times daily. 180 tablet 0  . methylphenidate (RITALIN) 5 MG tablet Take 3 tablets (15 mg total) by mouth 2 (two) times daily. 180 tablet 0   No facility-administered medications prior to visit.     Review of Systems;  Patient denies headache, fevers, malaise, unintentional  weight loss, skin rash, eye pain, sinus congestion and sinus pain, sore throat, dysphagia,  hemoptysis , cough, dyspnea, wheezing, chest pain, palpitations, orthopnea, edema, abdominal pain, nausea, melena, diarrhea, constipation, flank pain, dysuria, hematuria, urinary  Frequency, nocturia, numbness, tingling, seizures,  Focal weakness, Loss of consciousness,  Tremor, insomnia, depression, anxiety, and suicidal ideation.      Objective:  BP 110/80   Pulse 91   Resp 16   Wt 139 lb (63 kg)   SpO2 97%   BMI 23.13 kg/m   BP Readings from Last 3 Encounters:  07/05/16 110/80  04/06/16 124/76  09/16/15 126/72    Wt Readings from Last 3 Encounters:  07/05/16 139 lb (63 kg)  04/06/16 133 lb 8 oz (60.6 kg)  09/16/15 134 lb 8 oz (61 kg)    General appearance: alert, cooperative and appears stated age Neck: no adenopathy, no carotid bruit, supple, symmetrical, trachea midline and thyroid not enlarged, symmetric, no tenderness/mass/nodules Back: symmetric, no curvature. ROM normal. No CVA tenderness. Lungs: clear to auscultation bilaterally Heart: regular rate and rhythm, S1, S2 normal, no murmur, click, rub or gallop Abdomen: soft, non-tender; bowel sounds normal; no masses,  no organomegaly Pulses: 2+ and symmetric Skin: Skin color, texture, turgor normal. No rashes or lesions Lymph nodes: Cervical, supraclavicular, and axillary nodes normal. Psych: affect normal, makes good eye contact. No fidgeting,  Smiles easily.  Speech is articulate and focused.   No results found for: HGBA1C  Lab Results  Component Value Date  CREATININE 0.74 04/06/2016   CREATININE 0.71 08/25/2015   CREATININE 0.7 03/24/2014    Lab Results  Component Value Date   WBC 8.9 09/14/2015   HGB 13.6 09/14/2015   HCT 40.8 09/14/2015   PLT 205.0 09/14/2015   GLUCOSE 91 04/06/2016   CHOL 216 (H) 08/25/2015   TRIG 43.0 08/25/2015   HDL 81.80 08/25/2015   LDLDIRECT 136.8 12/05/2012   LDLCALC 126 (H)  08/25/2015   ALT 7 04/06/2016   AST 14 04/06/2016   NA 140 04/06/2016   K 4.0 04/06/2016   CL 105 04/06/2016   CREATININE 0.74 04/06/2016   BUN 16 04/06/2016   CO2 29 04/06/2016   TSH 0.94 08/25/2015    Dg Foot Complete Right  Result Date: 08/25/2015 CLINICAL DATA:  Blunt trauma to the second toe in November 2016 ; persistent swelling ; remote history of right foot surgery. EXAM: RIGHT FOOT COMPLETE - 3+ VIEW COMPARISON:  Right foot series of March 11, 2015 FINDINGS: The bones of the second toe are adequately mineralized. There is no periosteal reaction. No abnormal soft tissue calcifications are observed. The other phalanges appear intact. The interphalangeal and metatarsophalangeal joint spaces are preserved. The metatarsals are intact. Two metallic screws traverse the base of the fifth metatarsal extending into the base of the fourth metatarsal. The tarsal bones are intact. IMPRESSION: No acute or chronic bony abnormality of the right second toe is observed. There is no significant osteoarthritic change. There are chronic post fixation changes at the base of the fifth and fourth metatarsals. Electronically Signed   By: David  Martinique M.D.   On: 08/25/2015 14:58    Assessment & Plan:   Problem List Items Addressed This Visit    Adult attention deficit disorder    Patient functioning well and without side effects on current dose of 15 mg twice daily . We reviewed risks of Ritalin that were included in the HPI. Marland Kitchen Refill for 3 months given.       Vitamin D deficiency   Relevant Orders   VITAMIN D 25 Hydroxy (Vit-D Deficiency, Fractures)   Weight gain, abnormal - Primary    I have addressed  Weight gain and recommended a low glycemic index diet utilizing smaller more frequent meals to increase metabolism.  I have also recommended that patient start exercising with a goal of 30 minutes of aerobic exercise a minimum of 5 days per week. Screening for lipid disorders, thyroid and diabetes to  be done       Relevant Orders   Comprehensive metabolic panel   TSH    Other Visit Diagnoses    Attention deficit hyperactivity disorder (ADHD), combined type       Relevant Medications   methylphenidate (RITALIN) 5 MG tablet   methylphenidate (RITALIN) 5 MG tablet   Fatigue, unspecified type       Relevant Orders   CBC with Differential/Platelet   Pure hypercholesterolemia       Relevant Orders   Lipid panel   Weight gain          I am having Ms. Berntson Gilliard maintain her fish oil-omega-3 fatty acids, Efinaconazole, albuterol, ergocalciferol, methylphenidate, methylphenidate, and methylphenidate.  Meds ordered this encounter  Medications  . methylphenidate (RITALIN) 5 MG tablet    Sig: Take 3 tablets (15 mg total) by mouth 2 (two) times daily.    Dispense:  180 tablet    Refill:  0    May refill on or after Sep 06, 2016  .  methylphenidate (RITALIN) 5 MG tablet    Sig: Take 3 tablets (15 mg total) by mouth 2 (two) times daily.    Dispense:  180 tablet    Refill:  0    May refill on or after August 07 2016  . methylphenidate (RITALIN) 5 MG tablet    Sig: Take 3 tablets (15 mg total) by mouth 2 (two) times daily.    Dispense:  180 tablet    Refill:  0    May refill on or after July 07 2016    Medications Discontinued During This Encounter  Medication Reason  . methylphenidate (RITALIN) 5 MG tablet Reorder  . methylphenidate (RITALIN) 5 MG tablet Reorder  . methylphenidate (RITALIN) 5 MG tablet Reorder    Follow-up: No Follow-up on file.   Crecencio Mc, MD

## 2016-07-05 NOTE — Patient Instructions (Signed)
I have ordered fasting labs for you to get at your leisure over the next few months including assessment of thyroid function

## 2016-07-06 DIAGNOSIS — R635 Abnormal weight gain: Secondary | ICD-10-CM | POA: Insufficient documentation

## 2016-07-06 NOTE — Assessment & Plan Note (Signed)
I have addressed  Weight gain and recommended a low glycemic index diet utilizing smaller more frequent meals to increase metabolism.  I have also recommended that patient start exercising with a goal of 30 minutes of aerobic exercise a minimum of 5 days per week. Screening for lipid disorders, thyroid and diabetes to be done

## 2016-07-06 NOTE — Assessment & Plan Note (Signed)
Patient functioning well and without side effects on current dose of 15 mg twice daily . We reviewed risks of Ritalin that were included in the HPI. Marland Kitchen Refill for 3 months given.

## 2016-07-08 ENCOUNTER — Other Ambulatory Visit: Payer: Self-pay | Admitting: Obstetrics and Gynecology

## 2016-07-08 ENCOUNTER — Ambulatory Visit
Admission: RE | Admit: 2016-07-08 | Discharge: 2016-07-08 | Disposition: A | Discharge status: Home / Self Care | Payer: 59 | Source: Ambulatory Visit | Attending: Obstetrics and Gynecology | Admitting: Obstetrics and Gynecology

## 2016-07-08 DIAGNOSIS — Z1231 Encounter for screening mammogram for malignant neoplasm of breast: Secondary | ICD-10-CM | POA: Insufficient documentation

## 2016-07-08 DIAGNOSIS — R928 Other abnormal and inconclusive findings on diagnostic imaging of breast: Secondary | ICD-10-CM | POA: Insufficient documentation

## 2016-07-11 ENCOUNTER — Other Ambulatory Visit: Payer: Self-pay | Admitting: Obstetrics and Gynecology

## 2016-07-11 DIAGNOSIS — N6489 Other specified disorders of breast: Secondary | ICD-10-CM

## 2016-07-11 DIAGNOSIS — R928 Other abnormal and inconclusive findings on diagnostic imaging of breast: Secondary | ICD-10-CM

## 2016-07-14 ENCOUNTER — Ambulatory Visit
Admission: RE | Admit: 2016-07-14 | Discharge: 2016-07-14 | Disposition: A | Discharge status: Home / Self Care | Payer: 59 | Source: Ambulatory Visit | Attending: Obstetrics and Gynecology | Admitting: Obstetrics and Gynecology

## 2016-07-14 DIAGNOSIS — N6489 Other specified disorders of breast: Secondary | ICD-10-CM

## 2016-07-14 DIAGNOSIS — R928 Other abnormal and inconclusive findings on diagnostic imaging of breast: Secondary | ICD-10-CM

## 2016-07-19 ENCOUNTER — Other Ambulatory Visit: Payer: 59

## 2016-07-19 ENCOUNTER — Ambulatory Visit: Payer: 59

## 2016-08-18 ENCOUNTER — Encounter: Payer: Self-pay | Admitting: Obstetrics and Gynecology

## 2016-09-09 ENCOUNTER — Ambulatory Visit (INDEPENDENT_AMBULATORY_CARE_PROVIDER_SITE_OTHER): Payer: 59 | Admitting: Internal Medicine

## 2016-09-09 ENCOUNTER — Encounter: Payer: Self-pay | Admitting: Internal Medicine

## 2016-09-09 ENCOUNTER — Ambulatory Visit: Payer: 59 | Admitting: Internal Medicine

## 2016-09-09 VITALS — BP 100/70 | HR 64 | Temp 98.0°F | Resp 16 | Ht 65.0 in | Wt 138.6 lb

## 2016-09-09 DIAGNOSIS — R5383 Other fatigue: Secondary | ICD-10-CM

## 2016-09-09 DIAGNOSIS — F902 Attention-deficit hyperactivity disorder, combined type: Secondary | ICD-10-CM | POA: Diagnosis not present

## 2016-09-09 DIAGNOSIS — R635 Abnormal weight gain: Secondary | ICD-10-CM

## 2016-09-09 DIAGNOSIS — F988 Other specified behavioral and emotional disorders with onset usually occurring in childhood and adolescence: Secondary | ICD-10-CM | POA: Diagnosis not present

## 2016-09-09 DIAGNOSIS — M47814 Spondylosis without myelopathy or radiculopathy, thoracic region: Secondary | ICD-10-CM

## 2016-09-09 DIAGNOSIS — E559 Vitamin D deficiency, unspecified: Secondary | ICD-10-CM | POA: Diagnosis not present

## 2016-09-09 DIAGNOSIS — R87619 Unspecified abnormal cytological findings in specimens from cervix uteri: Secondary | ICD-10-CM

## 2016-09-09 DIAGNOSIS — E7800 Pure hypercholesterolemia, unspecified: Secondary | ICD-10-CM

## 2016-09-09 LAB — LIPID PANEL
Cholesterol: 208 mg/dL — ABNORMAL HIGH (ref 0–200)
HDL: 76.8 mg/dL (ref >39.00)
LDL Cholesterol: 121 mg/dL — ABNORMAL HIGH (ref 0–99)
NonHDL: 130.88
Total CHOL/HDL Ratio: 3
Triglycerides: 51 mg/dL (ref 0.0–149.0)
VLDL: 10.2 mg/dL (ref 0.0–40.0)

## 2016-09-09 LAB — TSH: TSH: 2.29 u[IU]/mL (ref 0.35–4.50)

## 2016-09-09 LAB — COMPREHENSIVE METABOLIC PANEL
ALT: 9 U/L (ref 0–35)
AST: 16 U/L (ref 0–37)
Albumin: 4.2 g/dL (ref 3.5–5.2)
Alkaline Phosphatase: 41 U/L (ref 39–117)
BUN: 10 mg/dL (ref 6–23)
CO2: 28 mEq/L (ref 19–32)
Calcium: 9 mg/dL (ref 8.4–10.5)
Chloride: 106 mEq/L (ref 96–112)
Creatinine, Ser: 0.74 mg/dL (ref 0.40–1.20)
GFR: 90.52 mL/min (ref >60.00)
Glucose, Bld: 94 mg/dL (ref 70–99)
Potassium: 3.8 mEq/L (ref 3.5–5.1)
Sodium: 139 mEq/L (ref 135–145)
Total Bilirubin: 0.8 mg/dL (ref 0.2–1.2)
Total Protein: 6.8 g/dL (ref 6.0–8.3)

## 2016-09-09 LAB — VITAMIN D 25 HYDROXY (VIT D DEFICIENCY, FRACTURES): VITD: 21.02 ng/mL — ABNORMAL LOW (ref 30.00–100.00)

## 2016-09-09 MED ORDER — METHYLPHENIDATE HCL 5 MG PO TABS: 15.0000 mg | ORAL_TABLET | Freq: Two times a day (BID) | ORAL | 0 refills | Status: DC

## 2016-09-09 NOTE — Progress Notes (Signed)
Pre visit review using our clinic review tool, if applicable. No additional management support is needed unless otherwise documented below in the visit note. 

## 2016-09-09 NOTE — Patient Instructions (Signed)

## 2016-09-09 NOTE — Progress Notes (Signed)
Subjective:  Patient ID: Crystal Reyes, female    DOB: Jan 30, 1971  Age: 46 y.o. MRN: [id number]  CC: The primary encounter diagnosis was Fatigue, unspecified type. Diagnoses of Attention deficit hyperactivity disorder (ADHD), combined type, Pure hypercholesterolemia, Vitamin D deficiency, Weight gain, abnormal, Adult attention deficit disorder, Thoracic spondylosis without myelopathy, and Abnormal cervical Papanicolaou smear, unspecified abnormal pap finding were also pertinent to this visit.  HPI Crystal Reyes presents for follow up on ADD managed with Ritalin. 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 She denies side effects including tachycardia, nervousness and insomnia.    She recently attended an out of town conference with a dear friend and colleague who suffered a stroke during the conference which presented with amnesia for current events and difficulty concentrating.  She spent the majority of the trip in the ER and hospital with the friend before coming home.   She continues to report mild low back pain that discourages her from running and is frustrated with her weight gain.  Diet and exercise discussed.   Outpatient Medications Prior to Visit  Medication Sig Dispense Refill  . albuterol (PROAIR HFA) 108 (90 BASE) MCG/ACT inhaler Inhale 2 puffs into the lungs every 6 (six) hours as needed for wheezing or shortness of breath. 1 Inhaler 1  . methylphenidate (RITALIN) 5 MG tablet Take 3 tablets (15 mg total) by mouth 2 (two) times daily. 180 tablet 0  . methylphenidate (RITALIN) 5 MG tablet Take 3 tablets (15 mg total) by mouth 2 (two) times daily. 180 tablet 0  . methylphenidate (RITALIN) 5 MG tablet Take 3 tablets (15 mg total) by mouth 2 (two) times daily. 180 tablet 0  . Efinaconazole 10 % SOLN Apply 1 drop topically daily. (Patient not taking: Reported on 09/09/2016) 4 mL 11  .  ergocalciferol (DRISDOL) 50000 units capsule Take 1 capsule (50,000 Units total) by mouth once a week. (Patient not taking: Reported on 09/09/2016) 4 capsule 3  . fish oil-omega-3 fatty acids 1000 MG capsule Take 2 g by mouth daily. Reported on 09/14/2015     No facility-administered medications prior to visit.     Review of Systems;  Patient denies headache, fevers, malaise, unintentional weight loss, skin rash, eye pain, sinus congestion and sinus pain, sore throat, dysphagia,  hemoptysis , cough, dyspnea, wheezing, chest pain, palpitations, orthopnea, edema, abdominal pain, nausea, melena, diarrhea, constipation, flank pain, dysuria, hematuria, urinary  Frequency, nocturia, numbness, tingling, seizures,  Focal weakness, Loss of consciousness,  Tremor, insomnia, depression, anxiety, and suicidal ideation.      Objective:  BP 100/70 (BP Location: Left Arm, Patient Position: Sitting, Cuff Size: Normal)   Pulse 64   Temp 98 F (36.7 C) (Oral)   Resp 16   Ht 5\' 5"  (1.651 m)   Wt 138 lb 9.6 oz (62.9 kg)   SpO2 98%   BMI 23.06 kg/m   BP Readings from Last 3 Encounters:  09/09/16 100/70  07/05/16 110/80  04/06/16 124/76    Wt Readings from Last 3 Encounters:  09/09/16 138 lb 9.6 oz (62.9 kg)  07/05/16 139 lb (63 kg)  04/06/16 133 lb 8 oz (60.6 kg)    General appearance: alert, calm, attentive, and appears stated age Neck: no adenopathy, no carotid bruit, supple, symmetrical, trachea midline and thyroid not enlarged, symmetric, no tenderness/mass/nodules Back: symmetric, no curvature. ROM normal. No CVA tenderness. Lungs: clear to auscultation bilaterally Heart:  regular rate and rhythm, S1, S2 normal, no murmur, click, rub or gallop Abdomen: soft, non-tender; bowel sounds normal; no masses,  no organomegaly Pulses: 2+ and symmetric Skin: Skin color, texture, turgor normal. No rashes or lesions Lymph nodes: Cervical, supraclavicular, and axillary nodes normal. MSK: no thoracic spine  tenderness,  ROM full,  Straight leg ligt negative  DTRS 2+ at the patellar and ankle bilaterally Psych: affect normal, makes good eye contact. No fidgeting,    No results found for: HGBA1C  Lab Results  Component Value Date   CREATININE 0.74 09/09/2016   CREATININE 0.74 04/06/2016   CREATININE 0.71 08/25/2015    Lab Results  Component Value Date   WBC 8.9 09/14/2015   HGB 13.6 09/14/2015   HCT 40.8 09/14/2015   PLT 205.0 09/14/2015   GLUCOSE 94 09/09/2016   CHOL 208 (H) 09/09/2016   TRIG 51.0 09/09/2016   HDL 76.80 09/09/2016   LDLDIRECT 136.8 12/05/2012   LDLCALC 121 (H) 09/09/2016   ALT 9 09/09/2016   AST 16 09/09/2016   NA 139 09/09/2016   K 3.8 09/09/2016   CL 106 09/09/2016   CREATININE 0.74 09/09/2016   BUN 10 09/09/2016   CO2 28 09/09/2016   TSH 2.29 09/09/2016    US Breast Ltd Uni Right Inc Axilla  Addendum Date: 08/18/2016   ADDENDUM REPORT: 08/18/2016 13:24 ADDENDUM: The patient's outside screening mammograms from 06/17/2014 and 09/21/2011 were obtained and comparison made. No suspicious mass, calcifications, or other abnormality is identified in the right breast. IMPRESSION: No mammographic or sonographic evidence of malignancy. RECOMMENDATION: Bilateral screening mammogram in March 2019. BI-RADS 1: Negative. Electronically Signed   By: Pamelia Hoit M.D.   On: 08/18/2016 13:24   Result Date: 08/18/2016 CLINICAL DATA:  46 year old female, callback from screening mammogram for possible asymmetry EXAM: 2D DIGITAL DIAGNOSTIC RIGHT MAMMOGRAM WITH CAD AND ADJUNCT TOMO ULTRASOUND RIGHT BREAST COMPARISON:  Previous exam(s). ACR Breast Density Category b: There are scattered areas of fibroglandular density. FINDINGS: Additional views of the right breast demonstrate no suspicious mass, distortion, or other abnormality in the area of concern seen on screening mammogram. Mammographic images were processed with CAD. On physical exam, no discrete mass is felt in the area of  concern within the lateral right breast. Targeted ultrasound of the lateral right breast was performed, showing no suspicious cystic or solid sonographic finding in the area of concern. A normal appearing intramammary lymph is noted at 8 o'clock, 6 cm from the nipple measuring 4 x 2 x 4 mm. IMPRESSION: No mammographic or sonographic evidence of malignancy. RECOMMENDATION: The patient's outside prior mammograms from [facility] will be obtained and comparison made. An addendum will be done at that time. I have discussed the findings and recommendations with the patient. Results were also provided in writing at the conclusion of the visit. If applicable, a reminder letter will be sent to the patient regarding the next appointment. BI-RADS CATEGORY  0: Incomplete. Need additional imaging evaluation and/or prior mammograms for comparison. Electronically Signed: By: Pamelia Hoit M.D. On: 07/14/2016 15:46   Mm Diag Breast Tomo Uni Right  Addendum Date: 08/18/2016   ADDENDUM REPORT: 08/18/2016 13:24 ADDENDUM: The patient's outside screening mammograms from 06/17/2014 and 09/21/2011 were obtained and comparison made. No suspicious mass, calcifications, or other abnormality is identified in the right breast. IMPRESSION: No mammographic or sonographic evidence of malignancy. RECOMMENDATION: Bilateral screening mammogram in March 2019. BI-RADS 1: Negative. Electronically Signed   By: Wendy Poet.D.  On: 08/18/2016 13:24   Result Date: 08/18/2016 CLINICAL DATA:  46 year old female, callback from screening mammogram for possible asymmetry EXAM: 2D DIGITAL DIAGNOSTIC RIGHT MAMMOGRAM WITH CAD AND ADJUNCT TOMO ULTRASOUND RIGHT BREAST COMPARISON:  Previous exam(s). ACR Breast Density Category b: There are scattered areas of fibroglandular density. FINDINGS: Additional views of the right breast demonstrate no suspicious mass, distortion, or other abnormality in the area of concern seen on screening mammogram. Mammographic  images were processed with CAD. On physical exam, no discrete mass is felt in the area of concern within the lateral right breast. Targeted ultrasound of the lateral right breast was performed, showing no suspicious cystic or solid sonographic finding in the area of concern. A normal appearing intramammary lymph is noted at 8 o'clock, 6 cm from the nipple measuring 4 x 2 x 4 mm. IMPRESSION: No mammographic or sonographic evidence of malignancy. RECOMMENDATION: The patient's outside prior mammograms from [facility] will be obtained and comparison made. An addendum will be done at that time. I have discussed the findings and recommendations with the patient. Results were also provided in writing at the conclusion of the visit. If applicable, a reminder letter will be sent to the patient regarding the next appointment. BI-RADS CATEGORY  0: Incomplete. Need additional imaging evaluation and/or prior mammograms for comparison. Electronically Signed: By: Pamelia Hoit M.D. On: 07/14/2016 15:46    Assessment & Plan:   Problem List Items Addressed This Visit    Abnormal Pap smear of cervix    She has annual Pap smears done by her gynecologist at [place] since she has a history of abnormal Pap smears recently. Last one was reportedly  Normal March 2018      Adult attention deficit disorder    Patient is functioning well and without side effects on current dose of 15 mg twice daily . We reviewed risks of Ritalin.  Marland Kitchen Refill for 3 months given.       Thoracic spondylosis without myelopathy    Managed with chiropractic therapy, massage and stretching. . Exercises for muscle strengthening given.       Vitamin D deficiency   Relevant Orders   VITAMIN D 25 Hydroxy (Vit-D Deficiency, Fractures) (Completed)   Weight gain, abnormal    Screening labs normal today.  BMI is normal l.  No history of snoring.  Recommended participating in regular exercise program with goal of achieving a minimum of 30  minutes of aerobic activity 5 days per week.   Lab Results  Component Value Date   CHOL 208 (H) 09/09/2016   HDL 76.80 09/09/2016   LDLCALC 121 (H) 09/09/2016   LDLDIRECT 136.8 12/05/2012   TRIG 51.0 09/09/2016   CHOLHDL 3 09/09/2016   Lab Results  Component Value Date   TSH 2.29 09/09/2016          Other Visit Diagnoses    Fatigue, unspecified type    -  Primary   Relevant Orders   Comprehensive metabolic panel (Completed)   TSH (Completed)   Attention deficit hyperactivity disorder (ADHD), combined type       Relevant Medications   methylphenidate (RITALIN) 5 MG tablet   methylphenidate (RITALIN) 5 MG tablet   Pure hypercholesterolemia       Relevant Orders   Lipid panel (Completed)        A total of 25 minutes of face to face time was spent with patient more than half of which was spent in counselling about the above  mentioned conditions  and coordination of care    I have discontinued Ms. Lenny Pastel Gilliard's fish oil-omega-3 fatty acids, Efinaconazole, and ergocalciferol. I am also having her maintain her albuterol, thiamine, multivitamin, methylphenidate, methylphenidate, and methylphenidate.  Meds ordered this encounter  Medications  . thiamine (VITAMIN B-1) 100 MG tablet    Sig: Take 100 mg by mouth daily.  . Multiple Vitamin (MULTIVITAMIN) tablet    Sig: Take 1 tablet by mouth daily.  . methylphenidate (RITALIN) 5 MG tablet    Sig: Take 3 tablets (15 mg total) by mouth 2 (two) times daily.    Dispense:  180 tablet    Refill:  0    May refill on or after December 07, 2016  . methylphenidate (RITALIN) 5 MG tablet    Sig: Take 3 tablets (15 mg total) by mouth 2 (two) times daily.    Dispense:  180 tablet    Refill:  0    May refill on or after November 06 2016  . methylphenidate (RITALIN) 5 MG tablet    Sig: Take 3 tablets (15 mg total) by mouth 2 (two) times daily.    Dispense:  180 tablet    Refill:  0    May refill on or after October 07 2016    Medications  Discontinued During This Encounter  Medication Reason  . Efinaconazole 10 % SOLN Patient has not taken in last 30 days  . ergocalciferol (DRISDOL) 50000 units capsule Patient has not taken in last 30 days  . fish oil-omega-3 fatty acids 1000 MG capsule Patient has not taken in last 30 days  . methylphenidate (RITALIN) 5 MG tablet Reorder  . methylphenidate (RITALIN) 5 MG tablet Reorder  . methylphenidate (RITALIN) 5 MG tablet Reorder    Follow-up: Return in about 3 months (around 12/10/2016).   Crecencio Mc, MD

## 2016-09-11 ENCOUNTER — Other Ambulatory Visit: Payer: Self-pay | Admitting: Internal Medicine

## 2016-09-11 MED ORDER — ERGOCALCIFEROL 1.25 MG (50000 UT) PO CAPS: 50000.0000 [IU] | ORAL_CAPSULE | ORAL | 2 refills | Status: DC

## 2016-09-11 NOTE — Assessment & Plan Note (Addendum)
Screening labs normal today.  BMI is normal l.  No history of snoring.  Recommended participating in regular exercise program with goal of achieving a minimum of 30 minutes of aerobic activity 5 days per week.   Lab Results  Component Value Date   CHOL 208 (H) 09/09/2016   HDL 76.80 09/09/2016   LDLCALC 121 (H) 09/09/2016   LDLDIRECT 136.8 12/05/2012   TRIG 51.0 09/09/2016   CHOLHDL 3 09/09/2016   Lab Results  Component Value Date   TSH 2.29 09/09/2016

## 2016-09-11 NOTE — Assessment & Plan Note (Signed)
Patient is functioning well and without side effects on current dose of 15 mg twice daily . We reviewed risks of Ritalin.  Marland Kitchen Refill for 3 months given.

## 2016-09-11 NOTE — Assessment & Plan Note (Addendum)
She has annual Pap smears done by her gynecologist at [place] since she has a history of abnormal Pap smears recently. Last one was reportedly  Normal March 2018

## 2016-09-11 NOTE — Assessment & Plan Note (Signed)
Managed with chiropractic therapy, massage and stretching. . Exercises for muscle strengthening given.

## 2016-09-11 NOTE — Progress Notes (Signed)
drisdol 

## 2016-12-13 ENCOUNTER — Encounter: Payer: Self-pay | Admitting: Internal Medicine

## 2016-12-13 ENCOUNTER — Ambulatory Visit (INDEPENDENT_AMBULATORY_CARE_PROVIDER_SITE_OTHER): Payer: 59 | Admitting: Internal Medicine

## 2016-12-13 DIAGNOSIS — R635 Abnormal weight gain: Secondary | ICD-10-CM

## 2016-12-13 DIAGNOSIS — F902 Attention-deficit hyperactivity disorder, combined type: Secondary | ICD-10-CM

## 2016-12-13 DIAGNOSIS — F988 Other specified behavioral and emotional disorders with onset usually occurring in childhood and adolescence: Secondary | ICD-10-CM | POA: Diagnosis not present

## 2016-12-13 MED ORDER — ERGOCALCIFEROL 1.25 MG (50000 UT) PO CAPS: 50000.0000 [IU] | ORAL_CAPSULE | ORAL | 5 refills | Status: DC

## 2016-12-13 MED ORDER — METHYLPHENIDATE HCL 5 MG PO TABS: 15.0000 mg | ORAL_TABLET | Freq: Two times a day (BID) | ORAL | 0 refills | Status: DC

## 2016-12-13 NOTE — Patient Instructions (Signed)
I have refilled the Megadose of Vitamin D to take weekly for 6 months  You do not need to be seen for 6 months unless you need a medication change.  Just call in 3 months for refills on the Ritalin

## 2016-12-13 NOTE — Progress Notes (Signed)
Subjective:  Patient ID: Crystal Reyes, female    DOB: [birth year]  Age: 46 y.o. MRN: [id number]  CC: Diagnoses of Attention deficit hyperactivity disorder (ADHD), combined type, Adult attention deficit disorder, and Weight gain, abnormal were pertinent to this visit.  HPI Crystal Reyes presents for 3 month follow up on ADD managed with ritalin.  She is functioning  well at work,and has no adverse effects from medication. sleeping well.    She has not had any ER visits  And has not requested any early refills.  Her Refill history was confirmed via [place] Controlled Substance database by me today during her visit and there have been no prescriptions of controlled substances filled from any providers other than me. .   Outpatient Medications Prior to Visit  Medication Sig Dispense Refill  . methylphenidate (RITALIN) 5 MG tablet Take 3 tablets (15 mg total) by mouth 2 (two) times daily. 180 tablet 0  . Multiple Vitamin (MULTIVITAMIN) tablet Take 1 tablet by mouth daily.    Marland Kitchen thiamine (VITAMIN B-1) 100 MG tablet Take 100 mg by mouth daily.    . ergocalciferol (DRISDOL) 50000 units capsule Take 1 capsule (50,000 Units total) by mouth once a week. 4 capsule 2  . methylphenidate (RITALIN) 5 MG tablet Take 3 tablets (15 mg total) by mouth 2 (two) times daily. 180 tablet 0  . methylphenidate (RITALIN) 5 MG tablet Take 3 tablets (15 mg total) by mouth 2 (two) times daily. 180 tablet 0  . albuterol (PROAIR HFA) 108 (90 BASE) MCG/ACT inhaler Inhale 2 puffs into the lungs every 6 (six) hours as needed for wheezing or shortness of breath. (Patient not taking: Reported on 12/13/2016) 1 Inhaler 1   No facility-administered medications prior to visit.     Review of Systems;  Patient denies headache, fevers, malaise, unintentional weight loss, skin rash, eye pain, sinus congestion and sinus pain, sore throat, dysphagia,  hemoptysis , cough, dyspnea, wheezing, chest pain, palpitations,  orthopnea, edema, abdominal pain, nausea, melena, diarrhea, constipation, flank pain, dysuria, hematuria, urinary  Frequency, nocturia, numbness, tingling, seizures,  Focal weakness, Loss of consciousness,  Tremor, insomnia, depression, anxiety, and suicidal ideation.      Objective:  BP 116/74 (BP Location: Left Arm, Patient Position: Sitting, Cuff Size: Normal)   Pulse 79   Temp 98.7 F (37.1 C) (Oral)   Resp 15   Ht 5\' 5"  (1.651 m)   Wt 136 lb 6.4 oz (61.9 kg)   SpO2 98%   BMI 22.70 kg/m   BP Readings from Last 3 Encounters:  12/13/16 116/74  09/09/16 100/70  07/05/16 110/80    Wt Readings from Last 3 Encounters:  12/13/16 136 lb 6.4 oz (61.9 kg)  09/09/16 138 lb 9.6 oz (62.9 kg)  07/05/16 139 lb (63 kg)    General appearance: alert, cooperative and appears stated age Ears: normal TM's and external ear canals both ears Throat: lips, mucosa, and tongue normal; teeth and gums normal Neck: no adenopathy, no carotid bruit, supple, symmetrical, trachea midline and thyroid not enlarged, symmetric, no tenderness/mass/nodules Back: symmetric, no curvature. ROM normal. No CVA tenderness. Lungs: clear to auscultation bilaterally Heart: regular rate and rhythm, S1, S2 normal, no murmur, click, rub or gallop Abdomen: soft, non-tender; bowel sounds normal; no masses,  no organomegaly Pulses: 2+ and symmetric Skin: Skin color, texture, turgor normal. No rashes or lesions Lymph nodes: Cervical, supraclavicular, and axillary nodes normal.  No results found for: HGBA1C  Lab Results  Component  Value Date   CREATININE 0.74 09/09/2016   CREATININE 0.74 04/06/2016   CREATININE 0.71 08/25/2015    Lab Results  Component Value Date   WBC 8.9 09/14/2015   HGB 13.6 09/14/2015   HCT 40.8 09/14/2015   PLT 205.0 09/14/2015   GLUCOSE 94 09/09/2016   CHOL 208 (H) 09/09/2016   TRIG 51.0 09/09/2016   HDL 76.80 09/09/2016   LDLDIRECT 136.8 12/05/2012   LDLCALC 121 (H) 09/09/2016   ALT 9  09/09/2016   AST 16 09/09/2016   NA 139 09/09/2016   K 3.8 09/09/2016   CL 106 09/09/2016   CREATININE 0.74 09/09/2016   BUN 10 09/09/2016   CO2 28 09/09/2016   TSH 2.29 09/09/2016    US Breast Ltd Uni Right Inc Axilla  Addendum Date: 08/18/2016   ADDENDUM REPORT: 08/18/2016 13:24 ADDENDUM: The patient's outside screening mammograms from 06/17/2014 and 09/21/2011 were obtained and comparison made. No suspicious mass, calcifications, or other abnormality is identified in the right breast. IMPRESSION: No mammographic or sonographic evidence of malignancy. RECOMMENDATION: Bilateral screening mammogram in March 2019. BI-RADS 1: Negative. Electronically Signed   By: Pamelia Hoit M.D.   On: 08/18/2016 13:24   Result Date: 08/18/2016 CLINICAL DATA:  46 year old female, callback from screening mammogram for possible asymmetry EXAM: 2D DIGITAL DIAGNOSTIC RIGHT MAMMOGRAM WITH CAD AND ADJUNCT TOMO ULTRASOUND RIGHT BREAST COMPARISON:  Previous exam(s). ACR Breast Density Category b: There are scattered areas of fibroglandular density. FINDINGS: Additional views of the right breast demonstrate no suspicious mass, distortion, or other abnormality in the area of concern seen on screening mammogram. Mammographic images were processed with CAD. On physical exam, no discrete mass is felt in the area of concern within the lateral right breast. Targeted ultrasound of the lateral right breast was performed, showing no suspicious cystic or solid sonographic finding in the area of concern. A normal appearing intramammary lymph is noted at 8 o'clock, 6 cm from the nipple measuring 4 x 2 x 4 mm. IMPRESSION: No mammographic or sonographic evidence of malignancy. RECOMMENDATION: The patient's outside prior mammograms from [facility] will be obtained and comparison made. An addendum will be done at that time. I have discussed the findings and recommendations with the patient. Results were also provided in writing at the  conclusion of the visit. If applicable, a reminder letter will be sent to the patient regarding the next appointment. BI-RADS CATEGORY  0: Incomplete. Need additional imaging evaluation and/or prior mammograms for comparison. Electronically Signed: By: Pamelia Hoit M.D. On: 07/14/2016 15:46   Mm Diag Breast Tomo Uni Right  Addendum Date: 08/18/2016   ADDENDUM REPORT: 08/18/2016 13:24 ADDENDUM: The patient's outside screening mammograms from 06/17/2014 and 09/21/2011 were obtained and comparison made. No suspicious mass, calcifications, or other abnormality is identified in the right breast. IMPRESSION: No mammographic or sonographic evidence of malignancy. RECOMMENDATION: Bilateral screening mammogram in March 2019. BI-RADS 1: Negative. Electronically Signed   By: Pamelia Hoit M.D.   On: 08/18/2016 13:24   Result Date: 08/18/2016 CLINICAL DATA:  46 year old female, callback from screening mammogram for possible asymmetry EXAM: 2D DIGITAL DIAGNOSTIC RIGHT MAMMOGRAM WITH CAD AND ADJUNCT TOMO ULTRASOUND RIGHT BREAST COMPARISON:  Previous exam(s). ACR Breast Density Category b: There are scattered areas of fibroglandular density. FINDINGS: Additional views of the right breast demonstrate no suspicious mass, distortion, or other abnormality in the area of concern seen on screening mammogram. Mammographic images were processed with CAD. On physical exam, no discrete mass is felt in  the area of concern within the lateral right breast. Targeted ultrasound of the lateral right breast was performed, showing no suspicious cystic or solid sonographic finding in the area of concern. A normal appearing intramammary lymph is noted at 8 o'clock, 6 cm from the nipple measuring 4 x 2 x 4 mm. IMPRESSION: No mammographic or sonographic evidence of malignancy. RECOMMENDATION: The patient's outside prior mammograms from [facility] will be obtained and comparison made. An addendum will be done at that time. I have discussed the  findings and recommendations with the patient. Results were also provided in writing at the conclusion of the visit. If applicable, a reminder letter will be sent to the patient regarding the next appointment. BI-RADS CATEGORY  0: Incomplete. Need additional imaging evaluation and/or prior mammograms for comparison. Electronically Signed: By: Pamelia Hoit M.D. On: 07/14/2016 15:46    Assessment & Plan:   Problem List Items Addressed This Visit    Weight gain, abnormal    Screening labs have been done and are normal .  BMI is normal .  She has no  history of snoring.  Encouraged her to start participating in regular exercise program for general health benefits with goal of achieving a minimum of 30 minutes of aerobic activity 5 days per week.   Lab Results  Component Value Date   CHOL 208 (H) 09/09/2016   HDL 76.80 09/09/2016   LDLCALC 121 (H) 09/09/2016   LDLDIRECT 136.8 12/05/2012   TRIG 51.0 09/09/2016   CHOLHDL 3 09/09/2016   Lab Results  Component Value Date   TSH 2.29 09/09/2016         Adult attention deficit disorder    Patient is functioning well and without side effects on current dose of 15 mg twice daily . We reviewed risks of Ritalin.  Marland Kitchen Refill for 3 months given.        Other Visit Diagnoses    Attention deficit hyperactivity disorder (ADHD), combined type       Relevant Medications   methylphenidate (RITALIN) 5 MG tablet   methylphenidate (RITALIN) 5 MG tablet     A total of 25 minutes of face to face time was spent with patient more than half of which was spent in counselling about the above mentioned conditions  and coordination of care   I have discontinued Ms. Lenny Pastel Gilliard's albuterol. I am also having her maintain her thiamine, multivitamin, methylphenidate, ergocalciferol, methylphenidate, and methylphenidate.  Meds ordered this encounter  Medications  . ergocalciferol (DRISDOL) 50000 units capsule    Sig: Take 1 capsule (50,000 Units total) by mouth  once a week.    Dispense:  4 capsule    Refill:  5  . methylphenidate (RITALIN) 5 MG tablet    Sig: Take 3 tablets (15 mg total) by mouth 2 (two) times daily.    Dispense:  180 tablet    Refill:  0    May refill on or after January 07, 2017  . DISCONTD: methylphenidate (RITALIN) 5 MG tablet    Sig: Take 3 tablets (15 mg total) by mouth 2 (two) times daily.    Dispense:  180 tablet    Refill:  0    May refill on or after February 06 2017  . methylphenidate (RITALIN) 5 MG tablet    Sig: Take 3 tablets (15 mg total) by mouth 2 (two) times daily.    Dispense:  180 tablet    Refill:  0    May refill on  or after March 09 2017    Medications Discontinued During This Encounter  Medication Reason  . albuterol (PROAIR HFA) 108 (90 BASE) MCG/ACT inhaler Patient has not taken in last 30 days  . ergocalciferol (DRISDOL) 50000 units capsule Reorder  . methylphenidate (RITALIN) 5 MG tablet Reorder  . methylphenidate (RITALIN) 5 MG tablet Reorder  . methylphenidate (RITALIN) 5 MG tablet Reorder    Follow-up: Return in about 6 months (around 06/15/2017).   Crecencio Mc, MD

## 2016-12-14 NOTE — Assessment & Plan Note (Signed)
Patient is functioning well and without side effects on current dose of 15 mg twice daily . We reviewed risks of Ritalin.  Marland Kitchen Refill for 3 months given.

## 2016-12-14 NOTE — Assessment & Plan Note (Signed)
Screening labs have been done and are normal .  BMI is normal .  She has no  history of snoring.  Encouraged her to start participating in regular exercise program for general health benefits with goal of achieving a minimum of 30 minutes of aerobic activity 5 days per week.   Lab Results  Component Value Date   CHOL 208 (H) 09/09/2016   HDL 76.80 09/09/2016   LDLCALC 121 (H) 09/09/2016   LDLDIRECT 136.8 12/05/2012   TRIG 51.0 09/09/2016   CHOLHDL 3 09/09/2016   Lab Results  Component Value Date   TSH 2.29 09/09/2016

## 2017-03-15 ENCOUNTER — Encounter: Payer: Self-pay | Admitting: Internal Medicine

## 2017-03-15 DIAGNOSIS — F902 Attention-deficit hyperactivity disorder, combined type: Secondary | ICD-10-CM

## 2017-03-16 MED ORDER — METHYLPHENIDATE HCL 5 MG PO TABS: 15.0000 mg | ORAL_TABLET | Freq: Two times a day (BID) | ORAL | 0 refills | Status: DC

## 2017-03-16 NOTE — Telephone Encounter (Signed)
meds signed thank you for pending them and changing the dates!

## 2017-04-07 DIAGNOSIS — Z85828 Personal history of other malignant neoplasm of skin: Secondary | ICD-10-CM | POA: Diagnosis not present

## 2017-05-23 ENCOUNTER — Encounter: Payer: Self-pay | Admitting: Obstetrics and Gynecology

## 2017-05-23 ENCOUNTER — Ambulatory Visit (INDEPENDENT_AMBULATORY_CARE_PROVIDER_SITE_OTHER): Payer: 59 | Admitting: Obstetrics and Gynecology

## 2017-05-23 VITALS — BP 128/82 | HR 74 | Ht 65.0 in | Wt 142.0 lb

## 2017-05-23 DIAGNOSIS — Z01419 Encounter for gynecological examination (general) (routine) without abnormal findings: Secondary | ICD-10-CM | POA: Diagnosis not present

## 2017-05-23 DIAGNOSIS — Z1239 Encounter for other screening for malignant neoplasm of breast: Secondary | ICD-10-CM

## 2017-05-23 DIAGNOSIS — Z1151 Encounter for screening for human papillomavirus (HPV): Secondary | ICD-10-CM | POA: Diagnosis not present

## 2017-05-23 DIAGNOSIS — Z1231 Encounter for screening mammogram for malignant neoplasm of breast: Secondary | ICD-10-CM | POA: Diagnosis not present

## 2017-05-23 DIAGNOSIS — Z124 Encounter for screening for malignant neoplasm of cervix: Secondary | ICD-10-CM

## 2017-05-23 NOTE — Patient Instructions (Addendum)
I value your feedback and entrusting us with your care. If you get a [facility] patient survey, I would appreciate you taking the time to let us know about your experience today. Thank you!  [facility]: [phone]    

## 2017-05-23 NOTE — Progress Notes (Signed)
PCP:  Crecencio Mc, MD   Chief Complaint  Patient presents with  . Gynecologic Exam     HPI:      Ms. Crystal Reyes is a 47 y.o. [id number] who LMP was Patient's last menstrual period was 05/07/2017., presents today for her annual examination.  Her menses are regular every 28-30 days, lasting 4 days.  Dysmenorrhea none. She does not have intermenstrual bleeding.  Sex activity: single partner, contraception - vasectomy Last Pap: August 05, 2013  Results were: no abnormalities /neg HPV DNA  Hx of STDs: HPV  Last mammogram: July 14, 2016  Results were: normal--routine follow-up in 12 months There is no FH of breast cancer. There is no FH of ovarian cancer. The patient does do self-breast exams.  Tobacco use: The patient denies current or previous tobacco use. Alcohol use: social drinker No drug use.  Exercise: not active  She does get adequate calcium and Vitamin D in her diet.  Labs with PCP   Past Medical History:  Diagnosis Date  . Abnormal Pap smear of cervix 2010   normal biopsy, followup with GYN  . Attention deficit disorder of adult without mention of hyperactivity    managed with methylphenidate  . Basal cell carcinoma   . Chronic back pain   . Headache     Past Surgical History:  Procedure Laterality Date  . BUNIONECTOMY    . COLPOSCOPY      Family History  Problem Relation Age of Onset  . Hypertension Mother   . Hyperlipidemia Mother   . Hyperlipidemia Father   . Hypertension Father   . Heart disease Maternal Grandfather   . Cancer Paternal Grandmother 71       melanoma  . Breast cancer Cousin        2nd cousin    Social History   Socioeconomic History  . Marital status: Legally Separated    Spouse name: Not on file  . Number of children: Not on file  . Years of education: Not on file  . Highest education level: Not on file  Social Needs  . Financial resource strain: Not on file  . Food insecurity - worry: Not on file  .  Food insecurity - inability: Not on file  . Transportation needs - medical: Not on file  . Transportation needs - non-medical: Not on file  Occupational History  . Occupation: attorney    Employer: Teacher, [employer]  Tobacco Use  . Smoking status: Never Smoker  . Smokeless tobacco: Never Used  Substance and Sexual Activity  . Alcohol use: Yes    Comment: wine occasional  . Drug use: No  . Sexual activity: Yes    Birth control/protection: None  Other Topics Concern  . Not on file  Social History Narrative  . Not on file    No outpatient medications have been marked as taking for the 05/23/17 encounter (Office Visit) with Copland, Alicia B, PA-C.     ROS:  Review of Systems  Constitutional: Positive for fatigue. Negative for fever and unexpected weight change.  Respiratory: Negative for cough, shortness of breath and wheezing.   Cardiovascular: Negative for chest pain, palpitations and leg swelling.  Gastrointestinal: Negative for blood in stool, constipation, diarrhea, nausea and vomiting.  Endocrine: Negative for cold intolerance, heat intolerance and polyuria.  Genitourinary: Negative for dyspareunia, dysuria, flank pain, frequency, genital sores, hematuria, menstrual problem, pelvic pain, urgency, vaginal bleeding, vaginal discharge and vaginal pain.  Musculoskeletal: Negative for  back pain, joint swelling and myalgias.  Skin: Negative for rash.  Neurological: Negative for dizziness, syncope, light-headedness, numbness and headaches.  Hematological: Negative for adenopathy.  Psychiatric/Behavioral: Negative for agitation, confusion, sleep disturbance and suicidal ideas. The patient is not nervous/anxious.      Objective: BP 128/82   Pulse 74   Ht 5\' 5"  (1.651 m)   Wt 142 lb (64.4 kg)   LMP 05/07/2017   BMI 23.63 kg/m    Physical Exam  Constitutional: She is oriented to person, place, and time. She appears well-developed and well-nourished.  Genitourinary:  Vagina normal and uterus normal. There is no rash or tenderness on the right labia. There is no rash or tenderness on the left labia. No erythema or tenderness in the vagina. No vaginal discharge found. Right adnexum does not display mass and does not display tenderness. Left adnexum does not display mass and does not display tenderness. Cervix does not exhibit motion tenderness or polyp. Uterus is not enlarged or tender.  Neck: Normal range of motion. No thyromegaly present.  Cardiovascular: Normal rate, regular rhythm and normal heart sounds.  No murmur heard. Pulmonary/Chest: Effort normal and breath sounds normal. Right breast exhibits no mass, no nipple discharge, no skin change and no tenderness. Left breast exhibits no mass, no nipple discharge, no skin change and no tenderness.  Abdominal: Soft. There is no tenderness. There is no guarding.  Musculoskeletal: Normal range of motion.  Neurological: She is alert and oriented to person, place, and time. No cranial nerve deficit.  Psychiatric: She has a normal mood and affect. Her behavior is normal.  Vitals reviewed.   Assessment/Plan: Encounter for annual routine gynecological examination  Cervical cancer screening - Plan: IGP, Aptima HPV  Screening for HPV (human papillomavirus) - Plan: IGP, Aptima HPV  Screening for breast cancer - Pt to sched mammo - Plan: MM SCREENING BREAST TOMO BILATERAL  GYN counsel breast self exam, mammography screening, adequate intake of calcium and vitamin D, diet and exercise     F/U  Return in about 1 year (around 05/23/2018).  Alicia B. Copland, PA-C 05/23/2017 10:42 AM

## 2017-05-25 LAB — IGP, APTIMA HPV
HPV Aptima: NEGATIVE
PAP Smear Comment: 0

## 2017-06-16 ENCOUNTER — Ambulatory Visit: Payer: 59 | Admitting: Internal Medicine

## 2017-06-20 ENCOUNTER — Ambulatory Visit (INDEPENDENT_AMBULATORY_CARE_PROVIDER_SITE_OTHER): Payer: 59 | Admitting: Internal Medicine

## 2017-06-20 ENCOUNTER — Encounter: Payer: Self-pay | Admitting: Internal Medicine

## 2017-06-20 DIAGNOSIS — F902 Attention-deficit hyperactivity disorder, combined type: Secondary | ICD-10-CM

## 2017-06-20 DIAGNOSIS — Z23 Encounter for immunization: Secondary | ICD-10-CM | POA: Diagnosis not present

## 2017-06-20 DIAGNOSIS — F988 Other specified behavioral and emotional disorders with onset usually occurring in childhood and adolescence: Secondary | ICD-10-CM | POA: Diagnosis not present

## 2017-06-20 MED ORDER — METHYLPHENIDATE HCL 5 MG PO TABS: 20.0000 mg | ORAL_TABLET | Freq: Two times a day (BID) | ORAL | 0 refills | Status: DC

## 2017-06-20 NOTE — Patient Instructions (Addendum)
I have increased the ritalin dose to 20 mg twice daily.  You can divide it up as needed and let me know what dose you helps you concentrate the best. Resist the urge to increase the dose just for  the benefits of  weight loss,  Because this may increase your blood pressure and heart rate       You received the flu vaccine today,  If you get exposed to the flu,  Call for a Tamiflu prescription

## 2017-06-20 NOTE — Progress Notes (Signed)
Subjective:  Patient ID: Crystal Reyes, female    DOB: 04/14/1971  Age: 47 y.o. MRN: [id number]  CC: Diagnoses of Attention deficit hyperactivity disorder (ADHD), combined type, Need for immunization against influenza, and Adult attention deficit disorder were pertinent to this visit.  HPI Crystal Reyes presents for follow up on ADD managed with generic  ritalin ,  She has been on a stable dose of 15 mg bid for at least 6 months,  But for the last two weeks has been experiencing cecreased concentration by early afternoon.  She has tried redistributing  her dose by dividing it into 3 doses of  10 mg , but her concentration is still poor by late morning and late afternoon,. She is requesting a trial of increased dose .  Has no trouble sleeping at night,  Uses melatonin prn .    Outpatient Medications Prior to Visit  Medication Sig Dispense Refill  . ergocalciferol (DRISDOL) 50000 units capsule Take 1 capsule (50,000 Units total) by mouth once a week. 4 capsule 5  . Multiple Vitamin (MULTIVITAMIN) tablet Take 1 tablet by mouth daily.    Marland Kitchen thiamine (VITAMIN B-1) 100 MG tablet Take 100 mg by mouth daily.    . methylphenidate (RITALIN) 5 MG tablet Take 3 tablets (15 mg total) 2 (two) times daily by mouth. 180 tablet 0  . methylphenidate (RITALIN) 5 MG tablet Take 3 tablets (15 mg total) 2 (two) times daily by mouth. 180 tablet 0  . methylphenidate (RITALIN) 5 MG tablet Take 3 tablets (15 mg total) 2 (two) times daily by mouth. 180 tablet 0   No facility-administered medications prior to visit.     Review of Systems;  Patient denies headache, fevers, malaise, unintentional weight loss, skin rash, eye pain, sinus congestion and sinus pain, sore throat, dysphagia,  hemoptysis , cough, dyspnea, wheezing, chest pain, palpitations, orthopnea, edema, abdominal pain, nausea, melena, diarrhea, constipation, flank pain, dysuria, hematuria, urinary  Frequency, nocturia, numbness,  tingling, seizures,  Focal weakness, Loss of consciousness,  Tremor, insomnia, depression, anxiety, and suicidal ideation.      Objective:  BP 104/68 (BP Location: Left Arm, Patient Position: Sitting, Cuff Size: Normal)   Pulse 81   Temp 97.9 F (36.6 C) (Oral)   Resp 15   Ht 5\' 5"  (1.651 m)   Wt 146 lb 3.2 oz (66.3 kg)   SpO2 99%   BMI 24.33 kg/m   BP Readings from Last 3 Encounters:  06/20/17 104/68  05/23/17 128/82  12/13/16 116/74    Wt Readings from Last 3 Encounters:  06/20/17 146 lb 3.2 oz (66.3 kg)  05/23/17 142 lb (64.4 kg)  12/13/16 136 lb 6.4 oz (61.9 kg)    General appearance: alert, cooperative and appears stated age Neck: no adenopathy, no carotid bruit, supple, symmetrical, trachea midline and thyroid not enlarged, symmetric, no tenderness/mass/nodules Back: symmetric, no curvature. ROM normal. No CVA tenderness. Lungs: clear to auscultation bilaterally Heart: regular rate and rhythm, S1, S2 normal, no murmur, click, rub or gallop Abdomen: soft, non-tender; bowel sounds normal; no masses,  no organomegaly Pulses: 2+ and symmetric Skin: Skin color, texture, turgor normal. No rashes or lesions Lymph nodes: Cervical, supraclavicular, and axillary nodes normal.  No results found for: HGBA1C  Lab Results  Component Value Date   CREATININE 0.74 09/09/2016   CREATININE 0.74 04/06/2016   CREATININE 0.71 08/25/2015    Lab Results  Component Value Date   WBC 8.9 09/14/2015   HGB 13.6 09/14/2015  HCT 40.8 09/14/2015   PLT 205.0 09/14/2015   GLUCOSE 94 09/09/2016   CHOL 208 (H) 09/09/2016   TRIG 51.0 09/09/2016   HDL 76.80 09/09/2016   LDLDIRECT 136.8 12/05/2012   LDLCALC 121 (H) 09/09/2016   ALT 9 09/09/2016   AST 16 09/09/2016   NA 139 09/09/2016   K 3.8 09/09/2016   CL 106 09/09/2016   CREATININE 0.74 09/09/2016   BUN 10 09/09/2016   CO2 28 09/09/2016   TSH 2.29 09/09/2016    US Breast Ltd Uni Right Inc Axilla  Addendum Date: 08/18/2016     ADDENDUM REPORT: 08/18/2016 13:24 ADDENDUM: The patient's outside screening mammograms from 06/17/2014 and 09/21/2011 were obtained and comparison made. No suspicious mass, calcifications, or other abnormality is identified in the right breast. IMPRESSION: No mammographic or sonographic evidence of malignancy. RECOMMENDATION: Bilateral screening mammogram in March 2019. BI-RADS 1: Negative. Electronically Signed   By: Pamelia Hoit M.D.   On: 08/18/2016 13:24   Result Date: 08/18/2016 CLINICAL DATA:  47 year old female, callback from screening mammogram for possible asymmetry EXAM: 2D DIGITAL DIAGNOSTIC RIGHT MAMMOGRAM WITH CAD AND ADJUNCT TOMO ULTRASOUND RIGHT BREAST COMPARISON:  Previous exam(s). ACR Breast Density Category b: There are scattered areas of fibroglandular density. FINDINGS: Additional views of the right breast demonstrate no suspicious mass, distortion, or other abnormality in the area of concern seen on screening mammogram. Mammographic images were processed with CAD. On physical exam, no discrete mass is felt in the area of concern within the lateral right breast. Targeted ultrasound of the lateral right breast was performed, showing no suspicious cystic or solid sonographic finding in the area of concern. A normal appearing intramammary lymph is noted at 8 o'clock, 6 cm from the nipple measuring 4 x 2 x 4 mm. IMPRESSION: No mammographic or sonographic evidence of malignancy. RECOMMENDATION: The patient's outside prior mammograms from [facility] will be obtained and comparison made. An addendum will be done at that time. I have discussed the findings and recommendations with the patient. Results were also provided in writing at the conclusion of the visit. If applicable, a reminder letter will be sent to the patient regarding the next appointment. BI-RADS CATEGORY  0: Incomplete. Need additional imaging evaluation and/or prior mammograms for comparison. Electronically Signed: By: Pamelia Hoit  M.D. On: 07/14/2016 15:46   Mm Diag Breast Tomo Uni Right  Addendum Date: 08/18/2016   ADDENDUM REPORT: 08/18/2016 13:24 ADDENDUM: The patient's outside screening mammograms from 06/17/2014 and 09/21/2011 were obtained and comparison made. No suspicious mass, calcifications, or other abnormality is identified in the right breast. IMPRESSION: No mammographic or sonographic evidence of malignancy. RECOMMENDATION: Bilateral screening mammogram in March 2019. BI-RADS 1: Negative. Electronically Signed   By: Pamelia Hoit M.D.   On: 08/18/2016 13:24   Result Date: 08/18/2016 CLINICAL DATA:  47 year old female, callback from screening mammogram for possible asymmetry EXAM: 2D DIGITAL DIAGNOSTIC RIGHT MAMMOGRAM WITH CAD AND ADJUNCT TOMO ULTRASOUND RIGHT BREAST COMPARISON:  Previous exam(s). ACR Breast Density Category b: There are scattered areas of fibroglandular density. FINDINGS: Additional views of the right breast demonstrate no suspicious mass, distortion, or other abnormality in the area of concern seen on screening mammogram. Mammographic images were processed with CAD. On physical exam, no discrete mass is felt in the area of concern within the lateral right breast. Targeted ultrasound of the lateral right breast was performed, showing no suspicious cystic or solid sonographic finding in the area of concern. A normal appearing intramammary lymph is  noted at 8 o'clock, 6 cm from the nipple measuring 4 x 2 x 4 mm. IMPRESSION: No mammographic or sonographic evidence of malignancy. RECOMMENDATION: The patient's outside prior mammograms from [facility] will be obtained and comparison made. An addendum will be done at that time. I have discussed the findings and recommendations with the patient. Results were also provided in writing at the conclusion of the visit. If applicable, a reminder letter will be sent to the patient regarding the next appointment. BI-RADS CATEGORY  0: Incomplete. Need additional imaging  evaluation and/or prior mammograms for comparison. Electronically Signed: By: Pamelia Hoit M.D. On: 07/14/2016 15:46    Assessment & Plan:   Problem List Items Addressed This Visit    Adult attention deficit disorder    Patient is not functioning as well but  without side effects on current dose of 15 mg twice daily . We reviewed risks of Ritalin and discussed a trial of 20 mg bid.  Refill for 3 months given.        Other Visit Diagnoses    Attention deficit hyperactivity disorder (ADHD), combined type       Relevant Medications   methylphenidate (RITALIN) 5 MG tablet   methylphenidate (RITALIN) 5 MG tablet   Need for immunization against influenza       Relevant Orders   Flu Vaccine QUAD 36+ mos IM (Completed)    A total of 25 minutes of face to face time was spent with patient more than half of which was spent in counselling and coordination of care   I have changed Crystal Reyes "Paige"'s methylphenidate and methylphenidate. I am also having her maintain her thiamine, multivitamin, ergocalciferol, and methylphenidate.  Meds ordered this encounter  Medications  . methylphenidate (RITALIN) 5 MG tablet    Sig: Take 4 tablets (20 mg total) by mouth 2 (two) times daily.    Dispense:  240 tablet    Refill:  0    May refill on or after July 07, 2017.  . methylphenidate (RITALIN) 5 MG tablet    Sig: Take 4 tablets (20 mg total) by mouth 2 (two) times daily.    Dispense:  240 tablet    Refill:  0    May refill on or after August 07 2017  . DISCONTD: methylphenidate (RITALIN) 5 MG tablet    Sig: Take 4 tablets (20 mg total) by mouth 2 (two) times daily.    Dispense:  240 tablet    Refill:  0    May refill on or after August 07 2017.  . methylphenidate (RITALIN) 5 MG tablet    Sig: Take 4 tablets (20 mg total) by mouth 2 (two) times daily.    Dispense:  240 tablet    Refill:  0    May refill on or after Sep 06 2017.    Medications Discontinued During This Encounter    Medication Reason  . methylphenidate (RITALIN) 5 MG tablet   . methylphenidate (RITALIN) 5 MG tablet Reorder  . methylphenidate (RITALIN) 5 MG tablet   . methylphenidate (RITALIN) 5 MG tablet Reorder    Follow-up: Return in about 6 months (around 12/18/2017).   Crecencio Mc, MD

## 2017-06-20 NOTE — Assessment & Plan Note (Signed)
Patient is not functioning as well but  without side effects on current dose of 15 mg twice daily . We reviewed risks of Ritalin and discussed a trial of 20 mg bid.  Refill for 3 months given.

## 2017-07-04 ENCOUNTER — Ambulatory Visit: Payer: Self-pay | Admitting: *Deleted

## 2017-07-04 DIAGNOSIS — J069 Acute upper respiratory infection, unspecified: Secondary | ICD-10-CM | POA: Diagnosis not present

## 2017-07-04 NOTE — Telephone Encounter (Signed)
Husband called in for pt because  "she is sleeping".   "She didn't get any sleep last night due to coughing".   She has a dry cough that has been going on for 3 weeks now.  At night it is worse and is not getting better.  I scheduled an appt with Mable Paris, FNP for 07/05/17 at 1:15.     Instructed husband to call us back or take her to the ED if her breathing becomes worse or she develops fever between now and her appt.   He verbalized understanding and thanked me for my help. Reason for Disposition . Cough has been present for > 3 weeks  Answer Assessment - Initial Assessment Questions 1. ONSET: "When did the cough begin?"      Began 3 wks ago.   Husband calling in for wife.   She is asleep.    2. SEVERITY: "How bad is the cough today?"      Last night hard time breathing and her ribs hurt. 3. RESPIRATORY DISTRESS: "Describe your breathing."      She said she is hard time breathing 4. FEVER: "Do you have a fever?" If so, ask: "What is your temperature, how was it measured, and when did it start?"     No fever 5. HEMOPTYSIS: "Are you coughing up any blood?" If so ask: "How much?" (flecks, streaks, tablespoons, etc.)     Dry cough that I'm aware of. 6. TREATMENT: "What have you done so far to treat the cough?" (e.g., meds, fluids, humidifier)     Using cough drops Deslyn. 7. CARDIAC HISTORY: "Do you have any history of heart disease?" (e.g., heart attack, congestive heart failure)      No history 8. LUNG HISTORY: "Do you have any history of lung disease?"  (e.g., pulmonary embolus, asthma, emphysema)     No lung issues 9. PE RISK FACTORS: "Do you have a history of blood clots?" (or: recent major surgery, recent prolonged travel, bedridden )     None of the above.    10. OTHER SYMPTOMS: "Do you have any other symptoms? (e.g., runny nose, wheezing, chest pain)       Rib pain.   I've not heard her wheezing.   At night the cough is worse. 11. PREGNANCY: "Is there any chance you are  pregnant?" "When was your last menstrual period?"       No  Zero chance 12. TRAVEL: "Have you traveled out of the country in the last month?" (e.g., travel history, exposures)       No  Protocols used: COUGH - ACUTE NON-PRODUCTIVE-A-AH

## 2017-07-05 ENCOUNTER — Ambulatory Visit: Payer: 59 | Admitting: Family

## 2017-07-19 DIAGNOSIS — J40 Bronchitis, not specified as acute or chronic: Secondary | ICD-10-CM | POA: Diagnosis not present

## 2017-07-23 DIAGNOSIS — S2231XA Fracture of one rib, right side, initial encounter for closed fracture: Secondary | ICD-10-CM | POA: Diagnosis not present

## 2017-07-23 DIAGNOSIS — J209 Acute bronchitis, unspecified: Secondary | ICD-10-CM | POA: Diagnosis not present

## 2017-07-31 ENCOUNTER — Other Ambulatory Visit: Payer: Self-pay | Admitting: Internal Medicine

## 2017-09-19 ENCOUNTER — Encounter: Payer: Self-pay | Admitting: Internal Medicine

## 2017-09-19 ENCOUNTER — Other Ambulatory Visit: Payer: Self-pay | Admitting: Internal Medicine

## 2017-09-19 DIAGNOSIS — F902 Attention-deficit hyperactivity disorder, combined type: Secondary | ICD-10-CM

## 2017-09-19 MED ORDER — METHYLPHENIDATE HCL 5 MG PO TABS: 20.0000 mg | ORAL_TABLET | Freq: Two times a day (BID) | ORAL | 0 refills | Status: DC

## 2017-09-19 NOTE — Telephone Encounter (Signed)
Refilled: 06/20/2017 3 months Last OV: 06/20/2017 Next OV: 12/12/2017

## 2017-09-20 NOTE — Telephone Encounter (Signed)
mychart message was sent to let pt know that her rxs have been placed up front for pick up.

## 2017-10-06 DIAGNOSIS — D225 Melanocytic nevi of trunk: Secondary | ICD-10-CM | POA: Diagnosis not present

## 2017-10-06 DIAGNOSIS — Z85828 Personal history of other malignant neoplasm of skin: Secondary | ICD-10-CM | POA: Diagnosis not present

## 2017-10-06 DIAGNOSIS — D2262 Melanocytic nevi of left upper limb, including shoulder: Secondary | ICD-10-CM | POA: Diagnosis not present

## 2017-10-10 ENCOUNTER — Encounter: Payer: Self-pay | Admitting: Obstetrics and Gynecology

## 2017-10-10 ENCOUNTER — Ambulatory Visit
Admission: RE | Admit: 2017-10-10 | Discharge: 2017-10-10 | Disposition: A | Discharge status: Home / Self Care | Payer: 59 | Source: Ambulatory Visit | Attending: Obstetrics and Gynecology | Admitting: Obstetrics and Gynecology

## 2017-10-10 DIAGNOSIS — Z1239 Encounter for other screening for malignant neoplasm of breast: Secondary | ICD-10-CM

## 2017-10-10 DIAGNOSIS — Z1231 Encounter for screening mammogram for malignant neoplasm of breast: Secondary | ICD-10-CM | POA: Insufficient documentation

## 2017-10-31 DIAGNOSIS — H10022 Other mucopurulent conjunctivitis, left eye: Secondary | ICD-10-CM | POA: Diagnosis not present

## 2017-12-12 ENCOUNTER — Ambulatory Visit (INDEPENDENT_AMBULATORY_CARE_PROVIDER_SITE_OTHER): Payer: 59 | Admitting: Internal Medicine

## 2017-12-12 ENCOUNTER — Encounter: Payer: Self-pay | Admitting: Internal Medicine

## 2017-12-12 DIAGNOSIS — F988 Other specified behavioral and emotional disorders with onset usually occurring in childhood and adolescence: Secondary | ICD-10-CM | POA: Diagnosis not present

## 2017-12-12 DIAGNOSIS — F902 Attention-deficit hyperactivity disorder, combined type: Secondary | ICD-10-CM

## 2017-12-12 MED ORDER — DOXYCYCLINE HYCLATE 100 MG PO TABS: 100.0000 mg | ORAL_TABLET | Freq: Two times a day (BID) | ORAL | 0 refills | Status: DC

## 2017-12-12 MED ORDER — METHYLPHENIDATE HCL 5 MG PO TABS
20.0000 mg | ORAL_TABLET | Freq: Two times a day (BID) | ORAL | 0 refills | Status: DC
Start: 2017-12-12 — End: 2018-02-26

## 2017-12-12 MED ORDER — METHYLPHENIDATE HCL 5 MG PO TABS: 20.0000 mg | ORAL_TABLET | Freq: Two times a day (BID) | ORAL | 0 refills | Status: DC

## 2017-12-12 NOTE — Progress Notes (Signed)
Subjective:  Patient ID: Crystal Reyes, female    DOB: Dec 23, 1970  Age: 47 y.o. MRN: [id number]  CC: Diagnoses of Attention deficit hyperactivity disorder (ADHD), combined type and Adult attention deficit disorder were pertinent to this visit.  HPI Crystal Reyes presents for 6 month follow up on ADD .  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  Will be sleeping in a bunk bed and eating communal meals.  rx for doxycycline given  Outpatient Medications Prior to Visit  Medication Sig Dispense Refill  . B Complex Vitamins (B COMPLEX 100 PO) Take 1 capsule by mouth daily.    Marland Kitchen CALCIUM-MAG-VIT C-VIT D PO Take 1 tablet by mouth daily.    . Coenzyme Q10 (CO Q 10) 10 MG CAPS Take 1 capsule by mouth daily.    . ergocalciferol (DRISDOL) 50000 units capsule Take 1 capsule (50,000 Units total) by mouth once a week. 4 capsule 5  . Multiple Vitamin (MULTIVITAMIN) tablet Take 1 tablet by mouth daily.    . methylphenidate (RITALIN) 5 MG tablet Take 4 tablets (20 mg total) by mouth 2 (two) times daily. 240 tablet 0  . methylphenidate (RITALIN) 5 MG tablet Take 4 tablets (20 mg total) by mouth 2 (two) times daily. 240 tablet 0  . methylphenidate (RITALIN) 5 MG tablet Take 4 tablets (20 mg total) by mouth 2 (two) times daily. 240 tablet 0  . thiamine (VITAMIN B-1) 100 MG tablet Take 100 mg by mouth daily.     No facility-administered medications prior to visit.     Review of Systems;  Patient denies headache, fevers, malaise, unintentional weight loss, skin rash, eye pain, sinus congestion and sinus pain, sore throat, dysphagia,  hemoptysis , cough, dyspnea, wheezing, chest pain, palpitations, orthopnea, edema, abdominal pain, nausea, melena, diarrhea, constipation, flank pain, dysuria, hematuria, urinary  Frequency, nocturia, numbness,  tingling, seizures,  Focal weakness, Loss of consciousness,  Tremor, insomnia, depression, anxiety, and suicidal ideation.      Objective:  BP 104/84 (BP Location: Left Arm, Patient Position: Sitting, Cuff Size: Normal)   Pulse 74   Temp 98.3 F (36.8 C) (Oral)   Resp 16   Ht 5\' 5"  (1.651 m)   Wt 142 lb 9.6 oz (64.7 kg)   SpO2 98%   BMI 23.73 kg/m   BP Readings from Last 3 Encounters:  12/12/17 104/84  06/20/17 104/68  05/23/17 128/82    Wt Readings from Last 3 Encounters:  12/12/17 142 lb 9.6 oz (64.7 kg)  06/20/17 146 lb 3.2 oz (66.3 kg)  05/23/17 142 lb (64.4 kg)    General appearance: alert, cooperative and appears stated age Ears: normal TM's and external ear canals both ears Throat: lips, mucosa, and tongue normal; teeth and gums normal Neck: no adenopathy, no carotid bruit, supple, symmetrical, trachea midline and thyroid not enlarged, symmetric, no tenderness/mass/nodules Back: symmetric, no curvature. ROM normal. No CVA tenderness. Lungs: clear to auscultation bilaterally Heart: regular rate and rhythm, S1, S2 normal, no murmur, click, rub or gallop Abdomen: soft, non-tender; bowel sounds normal; no masses,  no organomegaly Pulses: 2+ and symmetric Skin: Skin color, texture, turgor normal. No rashes or lesions Lymph nodes: Cervical, supraclavicular, and axillary nodes normal.  No results found for: HGBA1C  Lab Results  Component Value Date   CREATININE 0.74 09/09/2016   CREATININE 0.74 04/06/2016  CREATININE 0.71 08/25/2015    Lab Results  Component Value Date   WBC 8.9 09/14/2015   HGB 13.6 09/14/2015   HCT 40.8 09/14/2015   PLT 205.0 09/14/2015   GLUCOSE 94 09/09/2016   CHOL 208 (H) 09/09/2016   TRIG 51.0 09/09/2016   HDL 76.80 09/09/2016   LDLDIRECT 136.8 12/05/2012   LDLCALC 121 (H) 09/09/2016   ALT 9 09/09/2016   AST 16 09/09/2016   NA 139 09/09/2016   K 3.8 09/09/2016   CL 106 09/09/2016   CREATININE 0.74 09/09/2016   BUN 10  09/09/2016   CO2 28 09/09/2016   TSH 2.29 09/09/2016    Mm Screening Breast Tomo Bilateral  Result Date: 10/10/2017 CLINICAL DATA:  Screening. EXAM: DIGITAL SCREENING BILATERAL MAMMOGRAM WITH TOMO AND CAD COMPARISON:  Previous exam(s). ACR Breast Density Category b: There are scattered areas of fibroglandular density. FINDINGS: There are no findings suspicious for malignancy. Images were processed with CAD. IMPRESSION: No mammographic evidence of malignancy. A result letter of this screening mammogram will be mailed directly to the patient. RECOMMENDATION: Screening mammogram in one year. (Code:[id number]-B-01Y) BI-RADS CATEGORY  1: Negative. Electronically Signed   By: Nolon Nations M.D.   On: 10/10/2017 14:25    Assessment & Plan:   Problem List Items Addressed This Visit    Adult attention deficit disorder    Patient is functioning  wellt  without side effects on current dose of  20 mg bid .  We reviewed risks of Ritalin   Refill for 3 months given.        Other Visit Diagnoses    Attention deficit hyperactivity disorder (ADHD), combined type       Relevant Medications   methylphenidate (RITALIN) 5 MG tablet   methylphenidate (RITALIN) 5 MG tablet      I have discontinued Crystal Reyes "Paige"'s thiamine. I am also having her start on doxycycline. Additionally, I am having her maintain her multivitamin, ergocalciferol, CALCIUM-MAG-VIT C-VIT D PO, B Complex Vitamins (B COMPLEX 100 PO), Co Q 10, methylphenidate, methylphenidate, and methylphenidate.  Meds ordered this encounter  Medications  . doxycycline (VIBRA-TABS) 100 MG tablet    Sig: Take 1 tablet (100 mg total) by mouth 2 (two) times daily.    Dispense:  20 tablet    Refill:  0  . methylphenidate (RITALIN) 5 MG tablet    Sig: Take 4 tablets (20 mg total) by mouth 2 (two) times daily.    Dispense:  240 tablet    Refill:  0    May refill on or after August 31 ,  2019.  . methylphenidate (RITALIN) 5 MG tablet     Sig: Take 4 tablets (20 mg total) by mouth 2 (two) times daily.    Dispense:  240 tablet    Refill:  0    May refill on or after February 05, 2018  . methylphenidate (RITALIN) 5 MG tablet    Sig: Take 4 tablets (20 mg total) by mouth 2 (two) times daily.    Dispense:  240 tablet    Refill:  0    May refill on or after March 07, 2018.   A total of 25 minutes of face to face time was spent with patient more than half of which was spent in counselling about the above mentioned conditions  and coordination of care  Medications Discontinued During This Encounter  Medication Reason  . thiamine (VITAMIN B-1) 100 MG tablet Patient has not taken  in last 30 days  . methylphenidate (RITALIN) 5 MG tablet Reorder  . methylphenidate (RITALIN) 5 MG tablet Reorder  . methylphenidate (RITALIN) 5 MG tablet Reorder    Follow-up: Return in about 6 months (around 06/14/2018).   Crecencio Mc, MD

## 2017-12-12 NOTE — Patient Instructions (Addendum)
[email or web address] (send info on [facility],  Needs etc) .    Doxycycline sent to [facility].  Take with you to camping trip.  Start if you develop:  Fever/headaches/body aches with known tock exposure  Tick bite that starts to look infected (enlarging red area,  Bulls eye etc)

## 2017-12-13 NOTE — Assessment & Plan Note (Signed)
Patient is functioning  wellt  without side effects on current dose of  20 mg bid .  We reviewed risks of Ritalin.    Refill for 3 months given.  

## 2017-12-19 ENCOUNTER — Ambulatory Visit: Payer: 59 | Admitting: Internal Medicine

## 2018-02-23 ENCOUNTER — Other Ambulatory Visit: Payer: Self-pay

## 2018-02-23 DIAGNOSIS — F902 Attention-deficit hyperactivity disorder, combined type: Secondary | ICD-10-CM

## 2018-02-26 ENCOUNTER — Other Ambulatory Visit: Payer: Self-pay | Admitting: Internal Medicine

## 2018-02-26 DIAGNOSIS — F902 Attention-deficit hyperactivity disorder, combined type: Secondary | ICD-10-CM

## 2018-02-26 MED ORDER — METHYLPHENIDATE HCL 5 MG PO TABS: 20.0000 mg | ORAL_TABLET | Freq: Two times a day (BID) | ORAL | 0 refills | Status: DC

## 2018-02-26 NOTE — Telephone Encounter (Signed)
Refilled: can pick up her last refill on 03/07/2018. Pt is wanting the next three refills sent in.  Last OV: 12/12/2017 Next OV: 06/15/2018

## 2018-03-07 DIAGNOSIS — S61210A Laceration without foreign body of right index finger without damage to nail, initial encounter: Secondary | ICD-10-CM | POA: Diagnosis not present

## 2018-03-07 DIAGNOSIS — Z23 Encounter for immunization: Secondary | ICD-10-CM | POA: Diagnosis not present

## 2018-03-16 ENCOUNTER — Other Ambulatory Visit: Payer: Self-pay | Admitting: Internal Medicine

## 2018-03-16 ENCOUNTER — Telehealth: Payer: Self-pay | Admitting: Internal Medicine

## 2018-04-13 DIAGNOSIS — Z85828 Personal history of other malignant neoplasm of skin: Secondary | ICD-10-CM | POA: Diagnosis not present

## 2018-04-13 DIAGNOSIS — D2262 Melanocytic nevi of left upper limb, including shoulder: Secondary | ICD-10-CM | POA: Diagnosis not present

## 2018-04-13 DIAGNOSIS — X32XXXA Exposure to sunlight, initial encounter: Secondary | ICD-10-CM | POA: Diagnosis not present

## 2018-04-13 DIAGNOSIS — D2261 Melanocytic nevi of right upper limb, including shoulder: Secondary | ICD-10-CM | POA: Diagnosis not present

## 2018-04-13 DIAGNOSIS — L570 Actinic keratosis: Secondary | ICD-10-CM | POA: Diagnosis not present

## 2018-06-15 ENCOUNTER — Ambulatory Visit: Payer: 59 | Admitting: Internal Medicine

## 2018-06-19 ENCOUNTER — Ambulatory Visit (INDEPENDENT_AMBULATORY_CARE_PROVIDER_SITE_OTHER): Payer: 59 | Admitting: Internal Medicine

## 2018-06-19 ENCOUNTER — Encounter: Payer: Self-pay | Admitting: Internal Medicine

## 2018-06-19 VITALS — BP 110/80 | HR 83 | Temp 98.4°F | Wt 145.2 lb

## 2018-06-19 DIAGNOSIS — F902 Attention-deficit hyperactivity disorder, combined type: Secondary | ICD-10-CM

## 2018-06-19 DIAGNOSIS — F988 Other specified behavioral and emotional disorders with onset usually occurring in childhood and adolescence: Secondary | ICD-10-CM | POA: Diagnosis not present

## 2018-06-19 DIAGNOSIS — R5383 Other fatigue: Secondary | ICD-10-CM | POA: Diagnosis not present

## 2018-06-19 MED ORDER — METHYLPHENIDATE HCL 5 MG PO TABS: 20.0000 mg | ORAL_TABLET | Freq: Two times a day (BID) | ORAL | 0 refills | Status: DC

## 2018-06-19 NOTE — Progress Notes (Signed)
Subjective:  Patient ID: Crystal Reyes, female    DOB: 12-10-1970  Age: 48 y.o. MRN: [id number]  CC: The primary encounter diagnosis was Fatigue, unspecified type. Diagnoses of Attention deficit hyperactivity disorder (ADHD), combined type and Adult attention deficit disorder were also pertinent to this visit.  HPI Crystal Reyes presents for 6 month follow up on ADD.  She feels generally well and has been experiencing no adverse effects from the medication.  Her concentration is fine throughout the day , and she is having no insomnia.   .   Outpatient Medications Prior to Visit  Medication Sig Dispense Refill  . B Complex Vitamins (B COMPLEX 100 PO) Take 1 capsule by mouth daily.    Marland Kitchen CALCIUM-MAG-VIT C-VIT D PO Take 1 tablet by mouth daily.    . cefdinir (OMNICEF) 300 MG capsule     . Coenzyme Q10 (CO Q 10) 10 MG CAPS Take 1 capsule by mouth daily.    Marland Kitchen doxycycline (VIBRA-TABS) 100 MG tablet Take 1 tablet (100 mg total) by mouth 2 (two) times daily. 20 tablet 0  . ergocalciferol (DRISDOL) 50000 units capsule Take 1 capsule (50,000 Units total) by mouth once a week. 4 capsule 5  . Multiple Vitamin (MULTIVITAMIN) tablet Take 1 tablet by mouth daily.    . methylphenidate (RITALIN) 5 MG tablet Take 4 tablets (20 mg total) by mouth 2 (two) times daily. 240 tablet 0  . methylphenidate (RITALIN) 5 MG tablet Take 4 tablets (20 mg total) by mouth 2 (two) times daily. 240 tablet 0  . methylphenidate (RITALIN) 5 MG tablet Take 4 tablets (20 mg total) by mouth 2 (two) times daily. 240 tablet 0   No facility-administered medications prior to visit.     Review of Systems;  Patient denies headache, fevers, malaise, unintentional weight loss, skin rash, eye pain, sinus congestion and sinus pain, sore throat, dysphagia,  hemoptysis , cough, dyspnea, wheezing, chest pain, palpitations, orthopnea, edema, abdominal pain, nausea, melena, diarrhea, constipation, flank pain, dysuria,  hematuria, urinary  Frequency, nocturia, numbness, tingling, seizures,  Focal weakness, Loss of consciousness,  Tremor, insomnia, depression, anxiety, and suicidal ideation.      Objective:  BP 110/80   Pulse 83   Temp 98.4 F (36.9 C) (Oral)   Wt 145 lb 3.2 oz (65.9 kg)   LMP 06/10/2018 (Exact Date)   SpO2 98%   BMI 24.16 kg/m   BP Readings from Last 3 Encounters:  06/19/18 110/80  12/12/17 104/84  06/20/17 104/68    Wt Readings from Last 3 Encounters:  06/19/18 145 lb 3.2 oz (65.9 kg)  12/12/17 142 lb 9.6 oz (64.7 kg)  06/20/17 146 lb 3.2 oz (66.3 kg)    General appearance: alert, cooperative and appears stated age Ears: normal TM's and external ear canals both ears Throat: lips, mucosa, and tongue normal; teeth and gums normal Neck: no adenopathy, no carotid bruit, supple, symmetrical, trachea midline and thyroid not enlarged, symmetric, no tenderness/mass/nodules Back: symmetric, no curvature. ROM normal. No CVA tenderness. Lungs: clear to auscultation bilaterally Heart: regular rate and rhythm, S1, S2 normal, no murmur, click, rub or gallop Abdomen: soft, non-tender; bowel sounds normal; no masses,  no organomegaly Pulses: 2+ and symmetric Skin: Skin color, texture, turgor normal. No rashes or lesions Lymph nodes: Cervical, supraclavicular, and axillary nodes normal.  No results found for: HGBA1C  Lab Results  Component Value Date   CREATININE 0.74 09/09/2016   CREATININE 0.74 04/06/2016   CREATININE 0.71 08/25/2015  Lab Results  Component Value Date   WBC 8.9 09/14/2015   HGB 13.6 09/14/2015   HCT 40.8 09/14/2015   PLT 205.0 09/14/2015   GLUCOSE 94 09/09/2016   CHOL 208 (H) 09/09/2016   TRIG 51.0 09/09/2016   HDL 76.80 09/09/2016   LDLDIRECT 136.8 12/05/2012   LDLCALC 121 (H) 09/09/2016   ALT 9 09/09/2016   AST 16 09/09/2016   NA 139 09/09/2016   K 3.8 09/09/2016   CL 106 09/09/2016   CREATININE 0.74 09/09/2016   BUN 10 09/09/2016   CO2 28  09/09/2016   TSH 2.29 09/09/2016    Mm Screening Breast Tomo Bilateral  Result Date: 10/10/2017 CLINICAL DATA:  Screening. EXAM: DIGITAL SCREENING BILATERAL MAMMOGRAM WITH TOMO AND CAD COMPARISON:  Previous exam(s). ACR Breast Density Category b: There are scattered areas of fibroglandular density. FINDINGS: There are no findings suspicious for malignancy. Images were processed with CAD. IMPRESSION: No mammographic evidence of malignancy. A result letter of this screening mammogram will be mailed directly to the patient. RECOMMENDATION: Screening mammogram in one year. (Code:[id number]-B-01Y) BI-RADS CATEGORY  1: Negative. Electronically Signed   By: Nolon Nations M.D.   On: 10/10/2017 14:25    Assessment & Plan:   Problem List Items Addressed This Visit    Adult attention deficit disorder    Patient is functioning  wellt  without side effects on current dose of  20 mg bid .  We reviewed risks of Ritalin.    Refill for 3 months given.        Other Visit Diagnoses    Fatigue, unspecified type    -  Primary   Relevant Orders   Comprehensive metabolic panel   CBC with Differential/Platelet   TSH   Attention deficit hyperactivity disorder (ADHD), combined type       Relevant Medications   methylphenidate (RITALIN) 5 MG tablet (Start on 08/04/2018)   methylphenidate (RITALIN) 5 MG tablet (Start on 07/05/2018)      I am having Crystal Bach Gilliard "Paige" maintain her multivitamin, ergocalciferol, CALCIUM-MAG-VIT C-VIT D PO, B Complex Vitamins (B COMPLEX 100 PO), Co Q 10, doxycycline, cefdinir, methylphenidate, methylphenidate, and methylphenidate.  Meds ordered this encounter  Medications  . methylphenidate (RITALIN) 5 MG tablet    Sig: Take 4 tablets (20 mg total) by mouth 2 (two) times daily.    Dispense:  240 tablet    Refill:  0    May refill on or after September 03 2018  . methylphenidate (RITALIN) 5 MG tablet    Sig: Take 4 tablets (20 mg total) by mouth 2 (two) times daily.     Dispense:  240 tablet    Refill:  0    May refill on or after August 04 2018  . methylphenidate (RITALIN) 5 MG tablet    Sig: Take 4 tablets (20 mg total) by mouth 2 (two) times daily.    Dispense:  240 tablet    Refill:  0    May refill on or after February 27 , 2020.    Medications Discontinued During This Encounter  Medication Reason  . methylphenidate (RITALIN) 5 MG tablet Reorder  . methylphenidate (RITALIN) 5 MG tablet Reorder  . methylphenidate (RITALIN) 5 MG tablet Reorder    Follow-up: No follow-ups on file.   Crecencio Mc, MD

## 2018-06-20 ENCOUNTER — Encounter: Payer: Self-pay | Admitting: Internal Medicine

## 2018-06-20 NOTE — Assessment & Plan Note (Signed)
Patient is functioning  wellt  without side effects on current dose of  20 mg bid .  We reviewed risks of Ritalin.    Refill for 3 months given.

## 2018-07-13 ENCOUNTER — Other Ambulatory Visit (INDEPENDENT_AMBULATORY_CARE_PROVIDER_SITE_OTHER): Payer: 59

## 2018-07-13 DIAGNOSIS — R5383 Other fatigue: Secondary | ICD-10-CM

## 2018-07-13 LAB — CBC WITH DIFFERENTIAL/PLATELET
Basophils Absolute: 0.1 10*3/uL (ref 0.0–0.1)
Basophils Relative: 1.7 % (ref 0.0–3.0)
Eosinophils Absolute: 0.6 10*3/uL (ref 0.0–0.7)
Eosinophils Relative: 10.1 % — ABNORMAL HIGH (ref 0.0–5.0)
HCT: 39.6 % (ref 36.0–46.0)
Hemoglobin: 13.3 g/dL (ref 12.0–15.0)
Lymphocytes Relative: 35.4 % (ref 12.0–46.0)
Lymphs Abs: 2 10*3/uL (ref 0.7–4.0)
MCHC: 33.5 g/dL (ref 30.0–36.0)
MCV: 87.3 fl (ref 78.0–100.0)
Monocytes Absolute: 0.4 10*3/uL (ref 0.1–1.0)
Monocytes Relative: 6.9 % (ref 3.0–12.0)
Neutro Abs: 2.6 10*3/uL (ref 1.4–7.7)
Neutrophils Relative %: 45.9 % (ref 43.0–77.0)
Platelets: 263 10*3/uL (ref 150.0–400.0)
RBC: 4.53 Mil/uL (ref 3.87–5.11)
RDW: 12.2 % (ref 11.5–15.5)
WBC: 5.6 10*3/uL (ref 4.0–10.5)

## 2018-07-13 LAB — COMPREHENSIVE METABOLIC PANEL
ALT: 7 U/L (ref 0–35)
AST: 14 U/L (ref 0–37)
Albumin: 4.1 g/dL (ref 3.5–5.2)
Alkaline Phosphatase: 55 U/L (ref 39–117)
BUN: 11 mg/dL (ref 6–23)
CO2: 29 mEq/L (ref 19–32)
Calcium: 8.7 mg/dL (ref 8.4–10.5)
Chloride: 105 mEq/L (ref 96–112)
Creatinine, Ser: 0.75 mg/dL (ref 0.40–1.20)
GFR: 83.16 mL/min (ref >60.00)
Glucose, Bld: 85 mg/dL (ref 70–99)
Potassium: 3.6 mEq/L (ref 3.5–5.1)
Sodium: 139 mEq/L (ref 135–145)
Total Bilirubin: 0.6 mg/dL (ref 0.2–1.2)
Total Protein: 6.7 g/dL (ref 6.0–8.3)

## 2018-07-13 LAB — TSH: TSH: 2.1 u[IU]/mL (ref 0.35–4.50)

## 2018-07-16 ENCOUNTER — Encounter: Payer: Self-pay | Admitting: Family Medicine

## 2018-07-16 ENCOUNTER — Ambulatory Visit (INDEPENDENT_AMBULATORY_CARE_PROVIDER_SITE_OTHER): Payer: 59 | Admitting: Family Medicine

## 2018-07-16 DIAGNOSIS — M542 Cervicalgia: Secondary | ICD-10-CM | POA: Diagnosis not present

## 2018-07-16 DIAGNOSIS — M791 Myalgia, unspecified site: Secondary | ICD-10-CM

## 2018-07-16 MED ORDER — CYCLOBENZAPRINE HCL 5 MG PO TABS: 5.0000 mg | ORAL_TABLET | Freq: Three times a day (TID) | ORAL | 1 refills | Status: DC | PRN
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
## 2018-07-16 NOTE — Patient Instructions (Addendum)
Use topical muscle rub like BenGay or Biofreeze, also heating pad is effective for muscle pain.  Try Flexeril if needed for muscle spasm or muscle ache.  Do gentle range of motion neck exercises to help reduce muscle pain as well.   Neck Exercises Neck exercises can be important for many reasons:  They can help you to improve and maintain flexibility in your neck. This can be especially important as you age.  They can help to make your neck stronger. This can make movement easier.  They can reduce or prevent neck pain.  They may help your upper back. Ask your health care provider which neck exercises would be best for you. Exercises to improve neck flexibility Neck stretch Repeat this exercise 3-5 times. 1. Do this exercise while standing or while sitting in a chair. 2. Place your feet flat on the floor, shoulder-width apart. 3. Slowly turn your head to the right. Turn it all the way to the right so you can look over your right shoulder. Do not tilt or tip your head. 4. Hold this position for 10-30 seconds. 5. Slowly turn your head to the left, to look over your left shoulder. 6. Hold this position for 10-30 seconds.  Neck retraction Repeat this exercise 8-10 times. Do this 3-4 times a day or as told by your health care provider. 1. Do this exercise while standing or while sitting in a sturdy chair. 2. Look straight ahead. Do not bend your neck. 3. Use your fingers to push your chin backward. Do not bend your neck for this movement. Continue to face straight ahead. If you are doing the exercise properly, you will feel a slight sensation in your throat and a stretch at the back of your neck. 4. Hold the stretch for 1-2 seconds. Relax and repeat. Exercises to improve neck strength Neck press Repeat this exercise 10 times. Do it first thing in the morning and right before bed or as told by your health care provider. 1. Lie on your back on a firm bed or on the floor with a pillow under  your head. 2. Use your neck muscles to push your head down on the pillow and straighten your spine. 3. Hold the position as well as you can. Keep your head facing up and your chin tucked. 4. Slowly count to 5 while holding this position. 5. Relax for a few seconds. Then repeat. Isometric strengthening Do a full set of these exercises 2 times a day or as told by your health care provider. 1. Sit in a supportive chair and place your hand on your forehead. 2. Push forward with your head and neck while pushing back with your hand. Hold for 10 seconds. 3. Relax. Then repeat the exercise 3 times. 4. Next, do thesequence again, this time putting your hand against the back of your head. Use your head and neck to push backward against the hand pressure. 5. Finally, do the same exercise on either side of your head, pushing sideways against the pressure of your hand. Prone head lifts Repeat this exercise 5 times. Do this 2 times a day or as told by your health care provider. 1. Lie face-down, resting on your elbows so that your chest and upper back are raised. 2. Start with your head facing downward, near your chest. Position your chin either on or near your chest. 3. Slowly lift your head upward. Lift until you are looking straight ahead. Then continue lifting your head as far back  as you can stretch. 4. Hold your head up for 5 seconds. Then slowly lower it to your starting position. Supine head lifts Repeat this exercise 8-10 times. Do this 2 times a day or as told by your health care provider. 1. Lie on your back, bending your knees to point to the ceiling and keeping your feet flat on the floor. 2. Lift your head slowly off the floor, raising your chin toward your chest. 3. Hold for 5 seconds. 4. Relax and repeat. Scapular retraction Repeat this exercise 5 times. Do this 2 times a day or as told by your health care provider. 1. Stand with your arms at your sides. Look straight ahead. 2. Slowly  pull both shoulders backward and downward until you feel a stretch between your shoulder blades in your upper back. 3. Hold for 10-30 seconds. 4. Relax and repeat. Contact a health care provider if:  Your neck pain or discomfort gets much worse when you do an exercise.  Your neck pain or discomfort does not improve within 2 hours after you exercise. If you have any of these problems, stop exercising right away. Do not do the exercises again unless your health care provider says that you can. Get help right away if:  You develop sudden, severe neck pain. If this happens, stop exercising right away. Do not do the exercises again unless your health care provider says that you can. This information is not intended to replace advice given to you by your health care provider. Make sure you discuss any questions you have with your health care provider. Document Released: 04/06/2015 Document Revised: 08/29/2017 Document Reviewed: 11/03/2014 Elsevier Interactive Patient Education  2019 [employer].

## 2018-07-27 ENCOUNTER — Other Ambulatory Visit: Payer: Self-pay

## 2018-07-27 ENCOUNTER — Encounter: Payer: Self-pay | Admitting: *Deleted

## 2018-07-27 ENCOUNTER — Telehealth: Payer: Self-pay | Admitting: Internal Medicine

## 2018-07-27 ENCOUNTER — Other Ambulatory Visit: Payer: Self-pay | Admitting: Internal Medicine

## 2018-07-27 DIAGNOSIS — J22 Unspecified acute lower respiratory infection: Secondary | ICD-10-CM

## 2018-07-27 NOTE — Telephone Encounter (Signed)
Wife of patient being sent to [facility]. [name])   Advised her to go with him to be tested.

## 2018-07-27 NOTE — Telephone Encounter (Signed)
Covid paperwork sent to patient.

## 2018-08-05 LAB — NOVEL CORONAVIRUS, NAA: SARS-CoV-2, NAA: NOT DETECTED

## 2018-08-13 NOTE — Telephone Encounter (Signed)
PCP spoke with patient

## 2018-09-25 ENCOUNTER — Other Ambulatory Visit: Payer: Self-pay | Admitting: Internal Medicine

## 2018-09-25 DIAGNOSIS — F902 Attention-deficit hyperactivity disorder, combined type: Secondary | ICD-10-CM

## 2018-09-25 MED ORDER — METHYLPHENIDATE HCL 5 MG PO TABS: 20.0000 mg | ORAL_TABLET | Freq: Two times a day (BID) | ORAL | 0 refills | Status: DC

## 2018-12-14 ENCOUNTER — Ambulatory Visit: Payer: 59 | Admitting: Internal Medicine

## 2019-01-02 ENCOUNTER — Ambulatory Visit: Payer: 59 | Admitting: Internal Medicine

## 2019-01-03 ENCOUNTER — Encounter: Payer: Self-pay | Admitting: Internal Medicine

## 2019-01-03 ENCOUNTER — Ambulatory Visit (INDEPENDENT_AMBULATORY_CARE_PROVIDER_SITE_OTHER): Payer: 59 | Admitting: Internal Medicine

## 2019-01-03 VITALS — Ht 65.0 in | Wt 146.0 lb

## 2019-01-03 DIAGNOSIS — F902 Attention-deficit hyperactivity disorder, combined type: Secondary | ICD-10-CM | POA: Diagnosis not present

## 2019-01-03 DIAGNOSIS — Z20822 Contact with and (suspected) exposure to covid-19: Secondary | ICD-10-CM

## 2019-01-03 DIAGNOSIS — Z7189 Other specified counseling: Secondary | ICD-10-CM

## 2019-01-03 DIAGNOSIS — Z20828 Contact with and (suspected) exposure to other viral communicable diseases: Secondary | ICD-10-CM

## 2019-01-03 DIAGNOSIS — F988 Other specified behavioral and emotional disorders with onset usually occurring in childhood and adolescence: Secondary | ICD-10-CM

## 2019-01-03 MED ORDER — METHYLPHENIDATE HCL 5 MG PO TABS: 20.0000 mg | ORAL_TABLET | Freq: Two times a day (BID) | ORAL | 0 refills | Status: DC

## 2019-01-03 NOTE — Progress Notes (Signed)
Virtual Visit via [email or web address] Note  This visit type was conducted due to national recommendations for restrictions regarding the COVID-19 pandemic (e.g. social distancing).  This format is felt to be most appropriate for this patient at this time.  All issues noted in this document were discussed and addressed.  No physical exam was performed (except for noted visual exam findings with Video Visits).   I connected with@ on 01/03/19 at 10:00 AM EDT by a video enabled telemedicine application and verified that I am speaking with the correct person using two identifiers. Location patient: home Location provider: work or home office Persons participating in the virtual visit: patient, provider  I discussed the limitations, risks, security and privacy concerns of performing an evaluation and management service by telephone and the availability of in person appointments. I also discussed with the patient that there may be a patient responsible charge related to this service. The patient expressed understanding and agreed to proceed.  Reason for visit: medication refill  HPI:  Full house  ,  Some financial stressors recently but now improved since husband  Has found a new job after being laid off.     Feels generally well.  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  She is not  exercising.  Kids (4) at home.  Using ritalin as prescribed  With good concentration,  No overt insomnia or other side effects    Wants to be retested for covid prior to having vacation  with her mother    ROS: Patient denies headache, fevers, malaise, unintentional weight loss, skin rash, eye pain, sinus congestion and sinus pain, sore throat, dysphagia,  hemoptysis , cough, dyspnea, wheezing, chest pain, palpitations, orthopnea, edema, abdominal pain, nausea, melena, diarrhea, constipation, flank pain, dysuria, hematuria, urinary  Frequency, nocturia, numbness, tingling,  seizures,  Focal weakness, Loss of consciousness,  Tremor, insomnia, depression, anxiety, and suicidal ideation.      Past Medical History:  Diagnosis Date  . Abnormal Pap smear of cervix 2010   normal biopsy, followup with GYN  . Attention deficit disorder of adult without mention of hyperactivity    managed with methylphenidate  . Basal cell carcinoma   . Chronic back pain   . Headache     Past Surgical History:  Procedure Laterality Date  . BUNIONECTOMY    . COLPOSCOPY      Family History  Problem Relation Age of Onset  . Hypertension Mother   . Hyperlipidemia Mother   . Hyperlipidemia Father   . Hypertension Father   . Heart disease Maternal Grandfather   . Cancer Paternal Grandmother 75       melanoma  . Breast cancer Cousin        2nd cousin    SOCIAL HX:  reports that she has never smoked. She has never used smokeless tobacco. She reports current alcohol use. She reports that she does not use drugs.   Current Outpatient Medications:  .  B Complex Vitamins (B COMPLEX 100 PO), Take 1 capsule by mouth daily., Disp: , Rfl:  .  CALCIUM-MAG-VIT C-VIT D PO, Take 1 tablet by mouth daily., Disp: , Rfl:  .  cefdinir (OMNICEF) 300 MG capsule, , Disp: , Rfl:  .  Coenzyme Q10 (CO Q 10) 10 MG CAPS, Take 1 capsule by mouth daily., Disp: , Rfl:  .  cyclobenzaprine (FLEXERIL) 5 MG tablet, Take 1 tablet (5 mg total) by mouth 3 (three) times daily  as needed for muscle spasms., Disp: 30 tablet, Rfl: 1 .  doxycycline (VIBRA-TABS) 100 MG tablet, Take 1 tablet (100 mg total) by mouth 2 (two) times daily., Disp: 20 tablet, Rfl: 0 .  ergocalciferol (DRISDOL) 50000 units capsule, Take 1 capsule (50,000 Units total) by mouth once a week., Disp: 4 capsule, Rfl: 5 .  [START ON 03/04/2019] methylphenidate (RITALIN) 5 MG tablet, Take 4 tablets (20 mg total) by mouth 2 (two) times daily., Disp: 240 tablet, Rfl: 0 .  [START ON 02/02/2019] methylphenidate (RITALIN) 5 MG tablet, Take 4 tablets (20  mg total) by mouth 2 (two) times daily., Disp: 240 tablet, Rfl: 0 .  methylphenidate (RITALIN) 5 MG tablet, Take 4 tablets (20 mg total) by mouth 2 (two) times daily., Disp: 240 tablet, Rfl: 0 .  Multiple Vitamin (MULTIVITAMIN) tablet, Take 1 tablet by mouth daily., Disp: , Rfl:   EXAM:  VITALS per patient if applicable:  GENERAL: alert, oriented, appears well and in no acute distress  HEENT: atraumatic, conjunttiva clear, no obvious abnormalities on inspection of external nose and ears  NECK: normal movements of the head and neck  LUNGS: on inspection no signs of respiratory distress, breathing rate appears normal, no obvious gross SOB, gasping or wheezing  CV: no obvious cyanosis  MS: moves all visible extremities without noticeable abnormality  PSYCH/NEURO: pleasant and cooperative, no obvious depression or anxiety, speech and thought processing grossly intact  ASSESSMENT AND PLAN:  Adult attention deficit disorder Patient is functioning  well  without side effects on current dose of  20 mg bid .  We reviewed risks of Ritalin.    Refill for 3 months given.   Exposure to Covid-19 Virus she requesting repeat screening prior to meeting her mother for a family vacation    I discussed the assessment and treatment plan with the patient. The patient was provided an opportunity to ask questions and all were answered. The patient agreed with the plan and demonstrated an understanding of the instructions.   The patient was advised to call back or seek an in-person evaluation if the symptoms worsen or if the condition fails to improve as anticipated.  I provided 15 minutes of non-face-to-face time during this encounter.   Crecencio Mc, MD

## 2019-01-05 DIAGNOSIS — Z20822 Contact with and (suspected) exposure to covid-19: Secondary | ICD-10-CM | POA: Insufficient documentation

## 2019-01-05 DIAGNOSIS — Z20828 Contact with and (suspected) exposure to other viral communicable diseases: Secondary | ICD-10-CM | POA: Insufficient documentation

## 2019-01-05 NOTE — Assessment & Plan Note (Signed)
she requesting repeat screening prior to meeting her mother for a family vacation

## 2019-01-05 NOTE — Assessment & Plan Note (Signed)
Patient is functioning  well  without side effects on current dose of  20 mg bid .  We reviewed risks of Ritalin.    Refill for 3 months given.

## 2019-02-04 ENCOUNTER — Other Ambulatory Visit: Payer: Self-pay | Admitting: Internal Medicine

## 2019-02-04 DIAGNOSIS — Z1231 Encounter for screening mammogram for malignant neoplasm of breast: Secondary | ICD-10-CM

## 2019-02-11 ENCOUNTER — Telehealth: Payer: Self-pay

## 2019-02-11 NOTE — Telephone Encounter (Signed)
PA for methylphenidate has been submitted on covermymeds.  

## 2019-02-12 DIAGNOSIS — F902 Attention-deficit hyperactivity disorder, combined type: Secondary | ICD-10-CM

## 2019-02-13 NOTE — Telephone Encounter (Signed)
PA has been approved through 05/08/2038. Pt is aware.

## 2019-02-16 IMAGING — MG MM DIGITAL SCREENING BILAT W/ TOMO W/ CAD
6 of 10 series · 6 of 30 positions shown · non-contrast
Comparison: Previous exam(s).

CLINICAL DATA: Screening.

EXAM:
DIGITAL SCREENING BILATERAL MAMMOGRAM WITH TOMO AND CAD

[L CC synth-2D (1 of 2)]
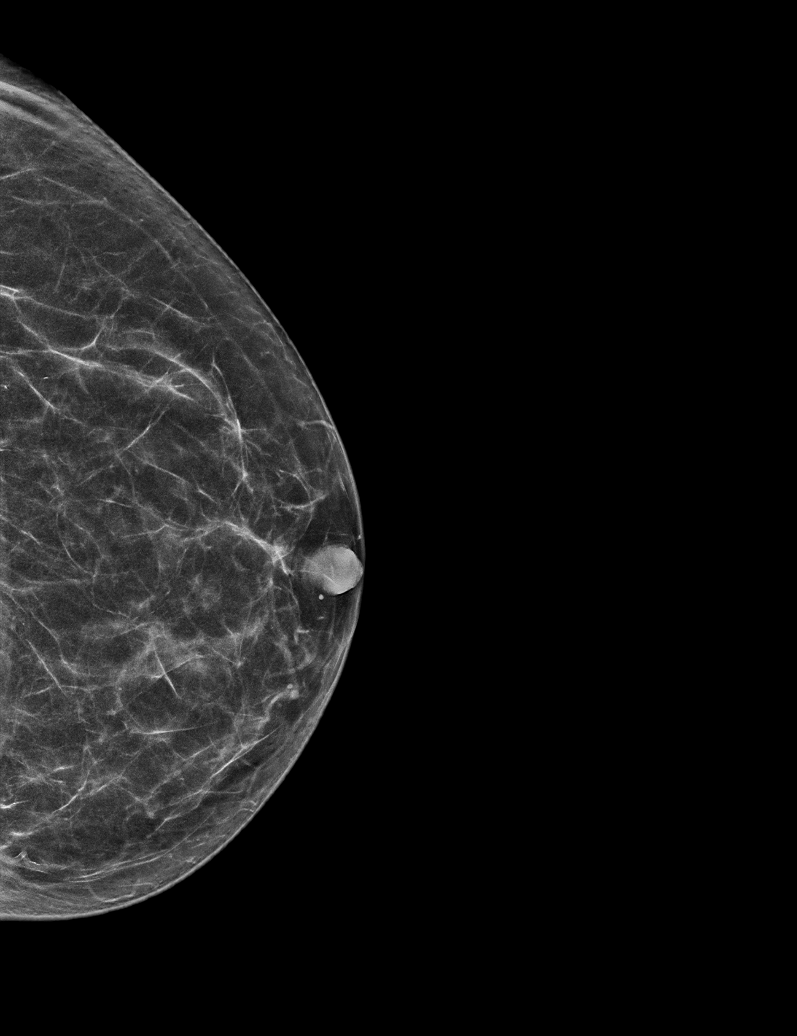

[R CC synth-2D]
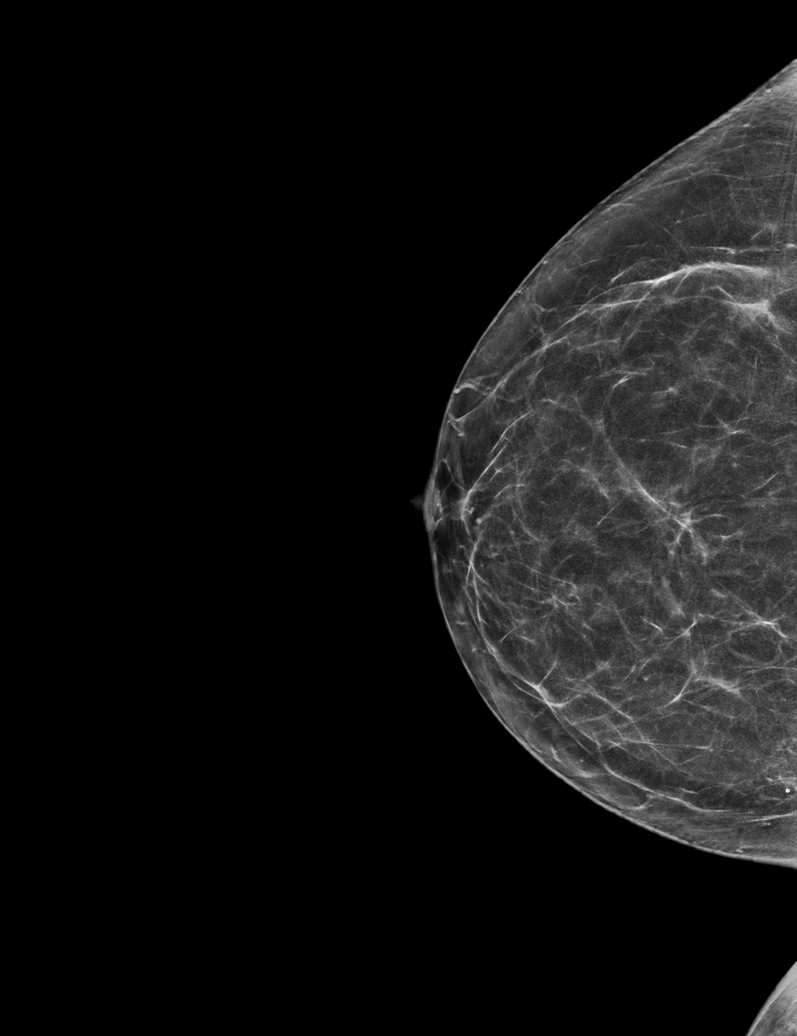

[L MLO synth-2D]
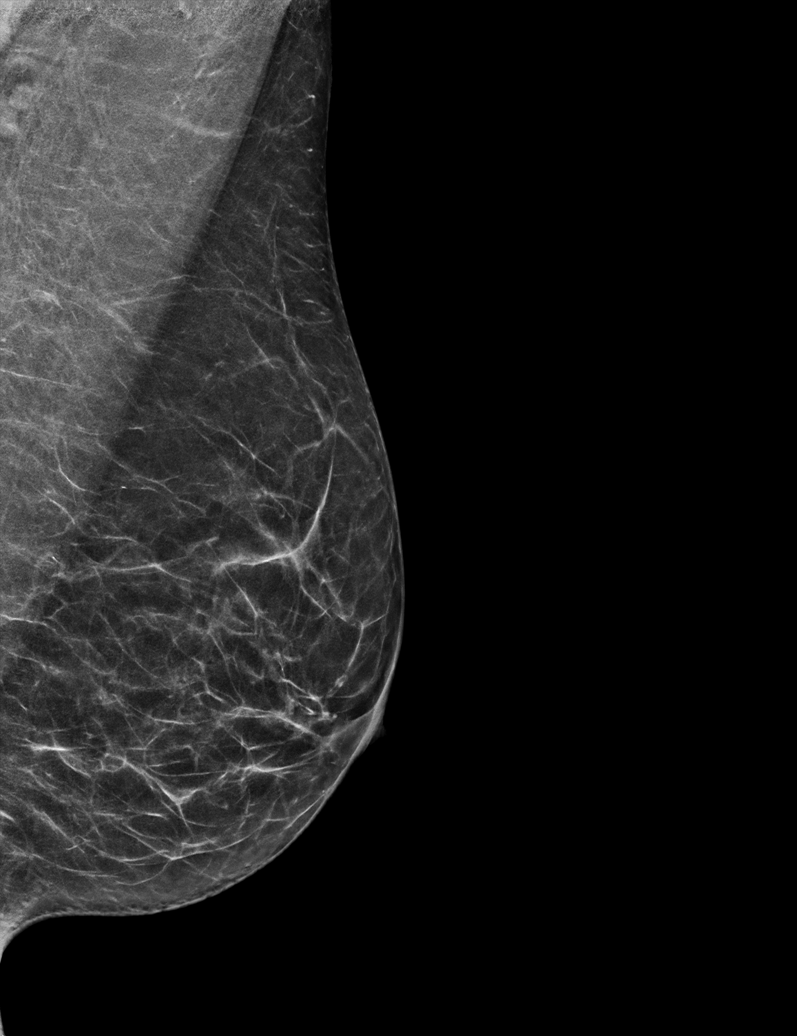

[R MLO synth-2D]
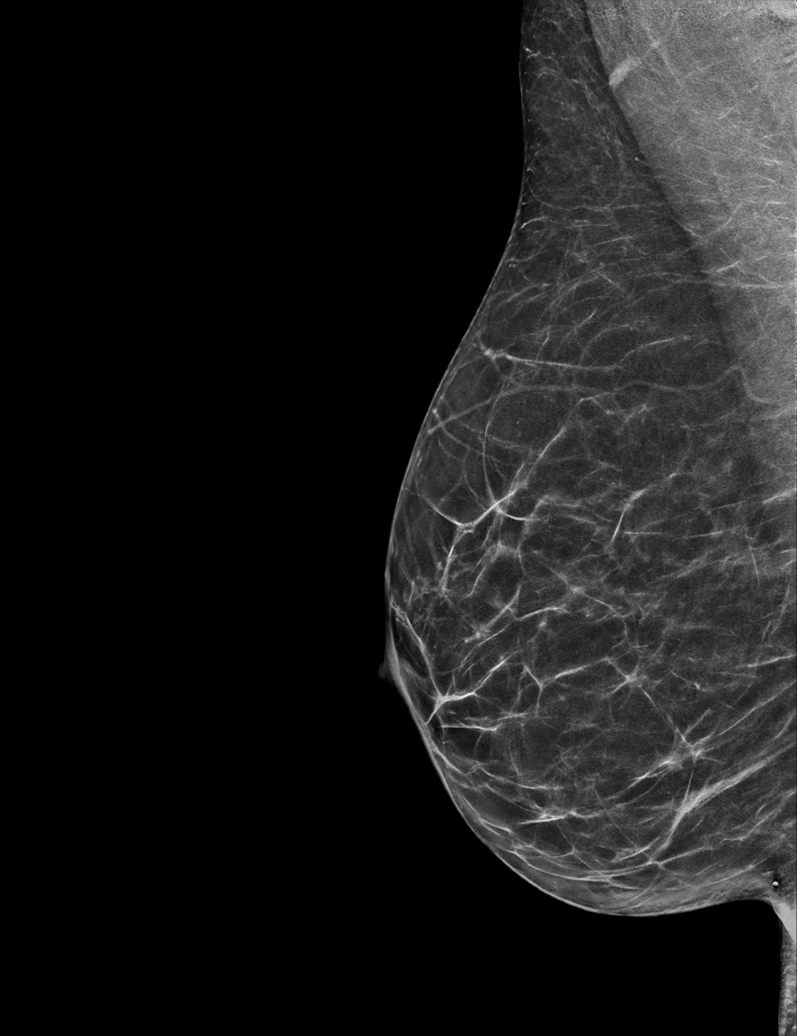

[L CC synth-2D (2 of 2)]
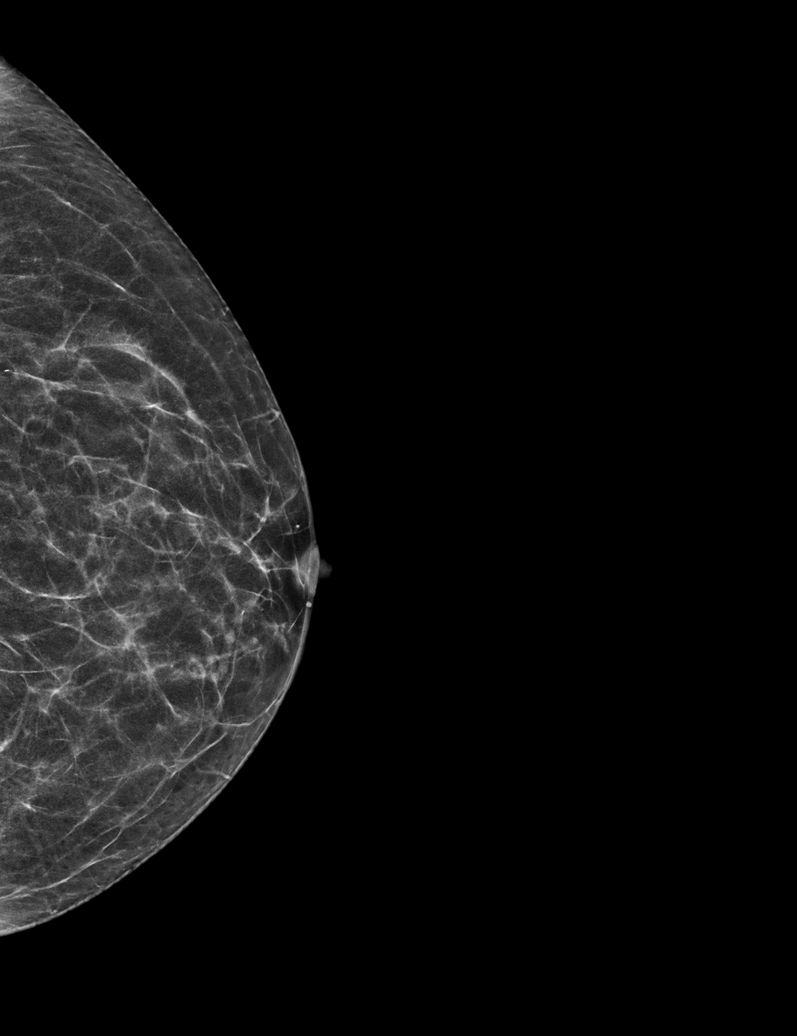

[L MLO tomo · tomo slice 29/56.0]
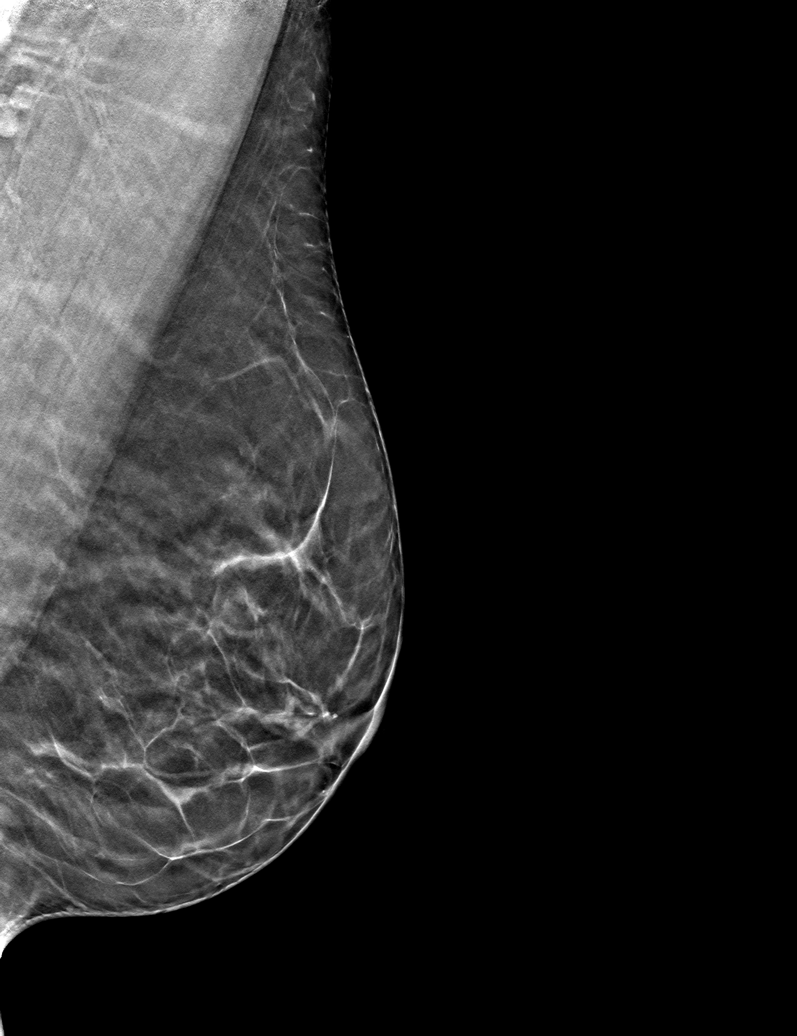

[6 of 30 positions shown; findings below may reference images not displayed]

ACR Breast Density Category b: There are scattered areas of
fibroglandular density.
FINDINGS: There are no findings suspicious for malignancy. Images were
processed with CAD.
IMPRESSION: No mammographic evidence of malignancy. A result letter of this
screening mammogram will be mailed directly to the patient.

RECOMMENDATION:
Screening mammogram in one year. (Code:[id number])

BI-RADS CATEGORY  1: Negative.

## 2019-02-19 ENCOUNTER — Ambulatory Visit: Payer: 59 | Admitting: Obstetrics and Gynecology

## 2019-03-12 ENCOUNTER — Ambulatory Visit
Admission: RE | Admit: 2019-03-12 | Discharge: 2019-03-12 | Disposition: A | Discharge status: Home / Self Care | Payer: BC Managed Care – PPO | Source: Ambulatory Visit | Attending: Internal Medicine | Admitting: Internal Medicine

## 2019-03-12 DIAGNOSIS — Z1231 Encounter for screening mammogram for malignant neoplasm of breast: Secondary | ICD-10-CM | POA: Insufficient documentation

## 2019-03-18 DIAGNOSIS — F902 Attention-deficit hyperactivity disorder, combined type: Secondary | ICD-10-CM

## 2019-03-18 NOTE — Telephone Encounter (Signed)
Last OV 12/2018

## 2019-03-19 MED ORDER — METHYLPHENIDATE HCL 5 MG PO TABS: 20.0000 mg | ORAL_TABLET | Freq: Two times a day (BID) | ORAL | 0 refills | Status: DC

## 2019-05-15 DIAGNOSIS — H40003 Preglaucoma, unspecified, bilateral: Secondary | ICD-10-CM | POA: Diagnosis not present

## 2019-06-04 ENCOUNTER — Ambulatory Visit: Payer: BC Managed Care – PPO | Attending: Internal Medicine

## 2019-06-04 DIAGNOSIS — Z20822 Contact with and (suspected) exposure to covid-19: Secondary | ICD-10-CM

## 2019-06-05 LAB — NOVEL CORONAVIRUS, NAA: SARS-CoV-2, NAA: NOT DETECTED

## 2019-06-11 ENCOUNTER — Encounter: Payer: Self-pay | Admitting: Internal Medicine

## 2019-06-11 ENCOUNTER — Ambulatory Visit (INDEPENDENT_AMBULATORY_CARE_PROVIDER_SITE_OTHER): Payer: BC Managed Care – PPO | Admitting: Internal Medicine

## 2019-06-11 ENCOUNTER — Other Ambulatory Visit: Payer: Self-pay

## 2019-06-11 DIAGNOSIS — F988 Other specified behavioral and emotional disorders with onset usually occurring in childhood and adolescence: Secondary | ICD-10-CM

## 2019-06-11 DIAGNOSIS — Z20822 Contact with and (suspected) exposure to covid-19: Secondary | ICD-10-CM | POA: Diagnosis not present

## 2019-06-11 DIAGNOSIS — F902 Attention-deficit hyperactivity disorder, combined type: Secondary | ICD-10-CM | POA: Diagnosis not present

## 2019-06-11 MED ORDER — METHYLPHENIDATE HCL 5 MG PO TABS: 20.0000 mg | ORAL_TABLET | Freq: Two times a day (BID) | ORAL | 0 refills | Status: DC

## 2019-06-11 NOTE — Patient Instructions (Signed)
  Check with your insurance about the cologuard test as a covered screening test for colon cancer.   Recommendations are now to start screening at age 47

## 2019-06-11 NOTE — Progress Notes (Signed)
Virtual Visit via [email or web address]  This visit type was conducted due to national recommendations for restrictions regarding the COVID-19 pandemic (e.g. social distancing).  This format is felt to be most appropriate for this patient at this time.  All issues noted in this document were discussed and addressed.  No physical exam was performed (except for noted visual exam findings with Video Visits).   I connected with@ on 06/11/19 at  1:00 PM EST by a video enabled telemedicine application  and verified that I am speaking with the correct person using two identifiers. Location patient: home Location provider: work or home office Persons participating in the virtual visit: patient, provider  I discussed the limitations, risks, security and privacy concerns of performing an evaluation and management service by telephone and the availability of in person appointments. I also discussed with the patient that there may be a patient responsible charge related to this service. The patient expressed understanding and agreed to proceed.  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  Recent exposure to COVID Positive daughter who became symptomatic on Jan 23 and diagnosed  by testing on 27th,Patient has  tested negative and remains asymptomatic.   Daughter has been quarantining since 23rd ,  As is patient.   ADD;  No changes needed to regimen. Taking meds as directed.  Holiday on weekends.    ROS: See pertinent positives and negatives per HPI.  Past Medical History:  Diagnosis Date  . Abnormal Pap smear of cervix 2010   normal biopsy, followup with GYN  . Attention deficit disorder of adult without mention of hyperactivity    managed with methylphenidate  . Basal cell carcinoma   . Chronic back pain   . Headache     Past Surgical History:  Procedure Laterality Date  . BUNIONECTOMY    . COLPOSCOPY      Family History  Problem Relation  Age of Onset  . Hypertension Mother   . Hyperlipidemia Mother   . Hyperlipidemia Father   . Hypertension Father   . Heart disease Maternal Grandfather   . Cancer Paternal Grandmother 25       melanoma  . Breast cancer Cousin        2nd cousin    SOCIAL HX:  reports that she has never smoked. She has never used smokeless tobacco. She reports current alcohol use. She reports that she does not use drugs.   Current Outpatient Medications:  .  B Complex Vitamins (B COMPLEX 100 PO), Take 1 capsule by mouth daily., Disp: , Rfl:  .  CALCIUM-MAG-VIT C-VIT D PO, Take 1 tablet by mouth daily., Disp: , Rfl:  .  Coenzyme Q10 (CO Q 10) 10 MG CAPS, Take 1 capsule by mouth daily., Disp: , Rfl:  .  methylphenidate (RITALIN) 5 MG tablet, Take 4 tablets (20 mg total) by mouth 2 (two) times daily., Disp: 240 tablet, Rfl: 0 .  [START ON 07/09/2019] methylphenidate (RITALIN) 5 MG tablet, Take 4 tablets (20 mg total) by mouth 2 (two) times daily., Disp: 240 tablet, Rfl: 0 .  methylphenidate (RITALIN) 5 MG tablet, Take 4 tablets (20 mg total) by mouth 2 (two) times daily., Disp: 240 tablet, Rfl: 0 .  [START ON 08/09/2019] methylphenidate (RITALIN) 5 MG tablet, Take 4 tablets (20 mg total) by mouth 2 (two) times daily., Disp: 240 tablet, Rfl: 0 .  Multiple Vitamin (MULTIVITAMIN) tablet, Take 1 tablet  by mouth daily., Disp: , Rfl:  .  zinc gluconate 50 MG tablet, Take 50 mg by mouth daily., Disp: , Rfl:   EXAM:  VITALS per patient if applicable:  GENERAL: alert, oriented, appears well and in no acute distress  HEENT: atraumatic, conjunttiva clear, no obvious abnormalities on inspection of external nose and ears  NECK: normal movements of the head and neck  LUNGS: on inspection no signs of respiratory distress, breathing rate appears normal, no obvious gross SOB, gasping or wheezing  CV: no obvious cyanosis  MS: moves all visible extremities without noticeable abnormality  PSYCH/NEURO: pleasant and  cooperative, no obvious depression or anxiety, speech and thought processing grossly intact  ASSESSMENT AND PLAN:  Discussed the following assessment and plan:  Exposure to COVID-19 virus  Adult attention deficit disorder  Attention deficit hyperactivity disorder (ADHD), combined type - Plan: methylphenidate (RITALIN) 5 MG tablet, methylphenidate (RITALIN) 5 MG tablet, methylphenidate (RITALIN) 5 MG tablet  Exposure to Covid-19 Virus Second family exposure,  Had some mild congestion ,  Tested negative on day 4 of exposure,  Recommend testing again 2 days prior to end of quarantine   Adult attention deficit disorder Patient is functioning  well  without side effects on current dose of  20 mg bid .  We reviewed risks of Ritalin.    Refill for 3 months given.     I discussed the assessment and treatment plan with the patient. The patient was provided an opportunity to ask questions and all were answered. The patient agreed with the plan and demonstrated an understanding of the instructions.   The patient was advised to call back or seek an in-person evaluation if the symptoms worsen or if the condition fails to improve as anticipated.  I provided  20  minutes of non-face-to-face time during this encounter.   Crecencio Mc, MD

## 2019-06-11 NOTE — Assessment & Plan Note (Signed)
Patient is functioning  well  without side effects on current dose of  20 mg bid .  We reviewed risks of Ritalin.    Refill for 3 months given.

## 2019-06-11 NOTE — Assessment & Plan Note (Signed)
Second family exposure,  Had some mild congestion ,  Tested negative on day 4 of exposure,  Recommend testing again 2 days prior to end of quarantine

## 2019-07-12 DIAGNOSIS — L820 Inflamed seborrheic keratosis: Secondary | ICD-10-CM | POA: Diagnosis not present

## 2019-07-12 DIAGNOSIS — Z85828 Personal history of other malignant neoplasm of skin: Secondary | ICD-10-CM | POA: Diagnosis not present

## 2019-07-12 DIAGNOSIS — L821 Other seborrheic keratosis: Secondary | ICD-10-CM | POA: Diagnosis not present

## 2019-07-12 DIAGNOSIS — L538 Other specified erythematous conditions: Secondary | ICD-10-CM | POA: Diagnosis not present

## 2019-07-12 DIAGNOSIS — L298 Other pruritus: Secondary | ICD-10-CM | POA: Diagnosis not present

## 2019-07-12 DIAGNOSIS — D2261 Melanocytic nevi of right upper limb, including shoulder: Secondary | ICD-10-CM | POA: Diagnosis not present

## 2019-07-12 DIAGNOSIS — D2271 Melanocytic nevi of right lower limb, including hip: Secondary | ICD-10-CM | POA: Diagnosis not present

## 2019-08-24 DIAGNOSIS — F902 Attention-deficit hyperactivity disorder, combined type: Secondary | ICD-10-CM

## 2019-08-28 MED ORDER — METHYLPHENIDATE HCL 5 MG PO TABS: 20.0000 mg | ORAL_TABLET | Freq: Two times a day (BID) | ORAL | 0 refills | Status: DC

## 2019-11-20 DIAGNOSIS — H40003 Preglaucoma, unspecified, bilateral: Secondary | ICD-10-CM | POA: Diagnosis not present

## 2019-12-09 ENCOUNTER — Telehealth: Payer: Self-pay | Admitting: Internal Medicine

## 2019-12-09 DIAGNOSIS — F902 Attention-deficit hyperactivity disorder, combined type: Secondary | ICD-10-CM

## 2019-12-09 MED ORDER — METHYLPHENIDATE HCL 5 MG PO TABS: 20.0000 mg | ORAL_TABLET | Freq: Two times a day (BID) | ORAL | 0 refills | Status: DC

## 2019-12-09 NOTE — Addendum Note (Signed)
Addended by: Crecencio Mc on: 12/09/2019 11:14 AM   Modules accepted: Orders

## 2019-12-09 NOTE — Telephone Encounter (Signed)
Pt needs a refill on methylphenidate (RITALIN) 5 MG tablet she has an appt tomorrow but she is out of medication

## 2019-12-09 NOTE — Telephone Encounter (Signed)
refilled 

## 2019-12-10 ENCOUNTER — Encounter: Payer: Self-pay | Admitting: Internal Medicine

## 2019-12-10 ENCOUNTER — Telehealth (INDEPENDENT_AMBULATORY_CARE_PROVIDER_SITE_OTHER): Payer: BC Managed Care – PPO | Admitting: Internal Medicine

## 2019-12-10 DIAGNOSIS — Z20822 Contact with and (suspected) exposure to covid-19: Secondary | ICD-10-CM

## 2019-12-10 DIAGNOSIS — F988 Other specified behavioral and emotional disorders with onset usually occurring in childhood and adolescence: Secondary | ICD-10-CM | POA: Diagnosis not present

## 2019-12-10 DIAGNOSIS — R87619 Unspecified abnormal cytological findings in specimens from cervix uteri: Secondary | ICD-10-CM

## 2019-12-10 DIAGNOSIS — F902 Attention-deficit hyperactivity disorder, combined type: Secondary | ICD-10-CM | POA: Diagnosis not present

## 2019-12-10 MED ORDER — METHYLPHENIDATE HCL 5 MG PO TABS: 20.0000 mg | ORAL_TABLET | Freq: Two times a day (BID) | ORAL | 0 refills | Status: DC

## 2019-12-10 NOTE — Assessment & Plan Note (Signed)
Her last PAP smear was normal in 7903 by [place] Copland

## 2019-12-10 NOTE — Progress Notes (Signed)
Virtual Visit via [employer]  This visit type was conducted due to national recommendations for restrictions regarding the COVID-19 pandemic (e.g. social distancing).  This format is felt to be most appropriate for this patient at this time.  All issues noted in this document were discussed and addressed.  No physical exam was performed (except for noted visual exam findings with Video Visits).   I connected with@ on 12/10/19 at  1:00 PM EDT by a video enabled telemedicine application  and verified that I am speaking with the correct person using two identifiers. Location patient: home Location provider: work or home office Persons participating in the virtual visit: patient, provider  I discussed the limitations, risks, security and privacy concerns of performing an evaluation and management service by telephone and the availability of in person appointments. I also discussed with the patient that there may be a patient responsible charge related to this service. The patient expressed understanding and agreed to proceed.  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  Feeling fine,  medicatio nworkign well without side effects.     ROS: See pertinent positives and negatives per HPI.  Past Medical History:  Diagnosis Date  . Abnormal Pap smear of cervix 2010   normal biopsy, followup with GYN  . Attention deficit disorder of adult without mention of hyperactivity    managed with methylphenidate  . Basal cell carcinoma   . Chronic back pain   . Headache     Past Surgical History:  Procedure Laterality Date  . BUNIONECTOMY    . COLPOSCOPY      Family History  Problem Relation Age of Onset  . Hypertension Mother   . Hyperlipidemia Mother   . Hyperlipidemia Father   . Hypertension Father   . Heart disease Maternal Grandfather   . Cancer Paternal Grandmother 39       melanoma  . Breast cancer Cousin         2nd cousin    SOCIAL HX:  reports that she has never smoked. She has never used smokeless tobacco. She reports current alcohol use. She reports that she does not use drugs.   Current Outpatient Medications:  .  methylphenidate (RITALIN) 5 MG tablet, Take 4 tablets (20 mg total) by mouth 2 (two) times daily., Disp: 240 tablet, Rfl: 0 .  [START ON 01/07/2020] methylphenidate (RITALIN) 5 MG tablet, Take 4 tablets (20 mg total) by mouth 2 (two) times daily., Disp: 240 tablet, Rfl: 0 .  [START ON 02/06/2020] methylphenidate (RITALIN) 5 MG tablet, Take 4 tablets (20 mg total) by mouth 2 (two) times daily., Disp: 240 tablet, Rfl: 0 .  [START ON 03/07/2020] methylphenidate (RITALIN) 5 MG tablet, Take 4 tablets (20 mg total) by mouth 2 (two) times daily., Disp: 240 tablet, Rfl: 0 .  B Complex Vitamins (B COMPLEX 100 PO), Take 1 capsule by mouth daily. (Patient not taking: Reported on 12/10/2019), Disp: , Rfl:  .  CALCIUM-MAG-VIT C-VIT D PO, Take 1 tablet by mouth daily. (Patient not taking: Reported on 12/10/2019), Disp: , Rfl:  .  Coenzyme Q10 (CO Q 10) 10 MG CAPS, Take 1 capsule by mouth daily. (Patient not taking: Reported on 12/10/2019), Disp: , Rfl:   EXAM:  VITALS per patient if applicable:  GENERAL: alert, oriented, appears well and in no acute distress  HEENT: atraumatic, conjunttiva clear, no obvious abnormalities on inspection of external nose and  ears  NECK: normal movements of the head and neck  LUNGS: on inspection no signs of respiratory distress, breathing rate appears normal, no obvious gross SOB, gasping or wheezing  CV: no obvious cyanosis  MS: moves all visible extremities without noticeable abnormality  PSYCH/NEURO: pleasant and cooperative, no obvious depression or anxiety, speech and thought processing grossly intact  ASSESSMENT AND PLAN:  Discussed the following assessment and plan:  Attention deficit hyperactivity disorder (ADHD), combined type - Plan: methylphenidate  (RITALIN) 5 MG tablet, methylphenidate (RITALIN) 5 MG tablet  Adult attention deficit disorder  Abnormal cervical Papanicolaou smear, unspecified abnormal pap finding  Exposure to COVID-19 virus  Adult attention deficit disorder Patient is functioning  well  without side effects on current dose of  20 mg bid .  We reviewed risks of Ritalin.    Refill for 3 months given.  Follow up in November   Abnormal Pap smear of cervix Her last PAP smear was normal in 2248 by [place] Copland  Exposure to Covid-19 Virus Despite two exposures she never tested positive.  She has now been vaccinated,  And most of her children have as well. She is attending  the annual week [employer]  [facility] [place] for disadvantaged kids.  Reviewed the current information about the Delta variant and recommending mask wearing     I discussed the assessment and treatment plan with the patient. The patient was provided an opportunity to ask questions and all were answered. The patient agreed with the plan and demonstrated an understanding of the instructions.   The patient was advised to call back or seek an in-person evaluation if the symptoms worsen or if the condition fails to improve as anticipated.  I provided  20  minutes of non-face-to-face time during this encounter.   Crecencio Mc, MD

## 2019-12-10 NOTE — Assessment & Plan Note (Signed)
Patient is functioning  well  without side effects on current dose of  20 mg bid .  We reviewed risks of Ritalin.    Refill for 3 months given.  Follow up in November

## 2019-12-10 NOTE — Assessment & Plan Note (Signed)
Despite two exposures she never tested positive.  She has now been vaccinated,  And most of her children have as well. She is attending  the annual week [employer]  [facility] [place] for disadvantaged kids.  Reviewed the current information about the Delta variant and recommending mask wearing

## 2020-01-17 DIAGNOSIS — Z85828 Personal history of other malignant neoplasm of skin: Secondary | ICD-10-CM | POA: Diagnosis not present

## 2020-01-17 DIAGNOSIS — D225 Melanocytic nevi of trunk: Secondary | ICD-10-CM | POA: Diagnosis not present

## 2020-01-17 DIAGNOSIS — L821 Other seborrheic keratosis: Secondary | ICD-10-CM | POA: Diagnosis not present

## 2020-01-17 DIAGNOSIS — L814 Other melanin hyperpigmentation: Secondary | ICD-10-CM | POA: Diagnosis not present

## 2020-03-12 ENCOUNTER — Other Ambulatory Visit: Payer: Self-pay | Admitting: Internal Medicine

## 2020-03-12 DIAGNOSIS — Z1231 Encounter for screening mammogram for malignant neoplasm of breast: Secondary | ICD-10-CM

## 2020-03-26 ENCOUNTER — Other Ambulatory Visit: Payer: Self-pay

## 2020-03-26 ENCOUNTER — Ambulatory Visit
Admission: RE | Admit: 2020-03-26 | Discharge: 2020-03-26 | Disposition: A | Discharge status: Home / Self Care | Payer: BC Managed Care – PPO | Source: Ambulatory Visit | Attending: Internal Medicine | Admitting: Internal Medicine

## 2020-03-26 DIAGNOSIS — Z1231 Encounter for screening mammogram for malignant neoplasm of breast: Secondary | ICD-10-CM | POA: Diagnosis not present

## 2020-03-31 ENCOUNTER — Telehealth: Payer: Self-pay | Admitting: Internal Medicine

## 2020-03-31 ENCOUNTER — Other Ambulatory Visit: Payer: Self-pay

## 2020-03-31 ENCOUNTER — Telehealth (INDEPENDENT_AMBULATORY_CARE_PROVIDER_SITE_OTHER): Payer: BC Managed Care – PPO | Admitting: Internal Medicine

## 2020-03-31 VITALS — Ht 64.0 in | Wt 148.0 lb

## 2020-03-31 DIAGNOSIS — F902 Attention-deficit hyperactivity disorder, combined type: Secondary | ICD-10-CM

## 2020-03-31 DIAGNOSIS — E663 Overweight: Secondary | ICD-10-CM

## 2020-03-31 DIAGNOSIS — Z23 Encounter for immunization: Secondary | ICD-10-CM

## 2020-03-31 DIAGNOSIS — R5383 Other fatigue: Secondary | ICD-10-CM | POA: Diagnosis not present

## 2020-03-31 DIAGNOSIS — R635 Abnormal weight gain: Secondary | ICD-10-CM | POA: Diagnosis not present

## 2020-03-31 DIAGNOSIS — E559 Vitamin D deficiency, unspecified: Secondary | ICD-10-CM | POA: Diagnosis not present

## 2020-03-31 DIAGNOSIS — F988 Other specified behavioral and emotional disorders with onset usually occurring in childhood and adolescence: Secondary | ICD-10-CM

## 2020-03-31 MED ORDER — METHYLPHENIDATE HCL 5 MG PO TABS: 20.0000 mg | ORAL_TABLET | Freq: Two times a day (BID) | ORAL | 0 refills | Status: DC

## 2020-03-31 NOTE — Telephone Encounter (Signed)
Lm on vm to call office and schedule fasting labs as soon as possible and schedule a 86m follow up.

## 2020-03-31 NOTE — Progress Notes (Signed)
Virtual Visit via [employer]  This visit type was conducted due to national recommendations for restrictions regarding the COVID-19 pandemic (e.g. social distancing).  This format is felt to be most appropriate for this patient at this time.  All issues noted in this document were discussed and addressed.  No physical exam was performed (except for noted visual exam findings with Video Visits).   I connected with@ on 04/01/20 at  1:00 PM EST by a video enabled telemedicine application  and verified that I am speaking with the correct person using two identifiers. Location patient: home Location provider: work or home office Persons participating in the virtual visit: patient, provider  I discussed the limitations, risks, security and privacy concerns of performing an evaluation and management service by telephone and the availability of in person appointments. I also discussed with the patient that there may be a patient responsible charge related to this service. The patient expressed understanding and agreed to proceed.  Reason for visit: medication refill,  Weight gain.   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  She has no problems or  issues with her current medication regimen.  She is concentrating well and denies insomnia.  Refill history confirmed via [place] Controlled Substance databas, accessed by me today..  Weight gain:  She is frustrated at her continued inability to lose weight.  She has gained 20 lbs over the last 3 years.  She has lost 5 since  Her last visit. Reviewed diet , exercise regimen.  She has not exercised regularly since her COVID infection last year.   ROS: See pertinent positives and negatives per HPI.  Past Medical History:  Diagnosis Date  . Abnormal Pap smear of cervix 2010   normal biopsy, followup with GYN  . Attention deficit disorder of adult without mention of hyperactivity    managed with  methylphenidate  . Basal cell carcinoma   . Chronic back pain   . Headache     Past Surgical History:  Procedure Laterality Date  . BUNIONECTOMY    . COLPOSCOPY      Family History  Problem Relation Age of Onset  . Hypertension Mother   . Hyperlipidemia Mother   . Hyperlipidemia Father   . Hypertension Father   . Heart disease Maternal Grandfather   . Cancer Paternal Grandmother 39       melanoma  . Breast cancer Cousin        2nd cousin    SOCIAL HX:  reports that she has never smoked. She has never used smokeless tobacco. She reports current alcohol use. She reports that she does not use drugs.   Current Outpatient Medications:  .  methylphenidate (RITALIN) 5 MG tablet, Take 4 tablets (20 mg total) by mouth 2 (two) times daily., Disp: 240 tablet, Rfl: 0 .  methylphenidate (RITALIN) 5 MG tablet, Take 4 tablets (20 mg total) by mouth 2 (two) times daily., Disp: 240 tablet, Rfl: 0 .  methylphenidate (RITALIN) 5 MG tablet, Take 4 tablets (20 mg total) by mouth 2 (two) times daily., Disp: 240 tablet, Rfl: 0 .  [START ON 04/06/2020] methylphenidate (RITALIN) 5 MG tablet, Take 4 tablets (20 mg total) by mouth 2 (two) times daily., Disp: 240 tablet, Rfl: 0  EXAM:  VITALS per patient if applicable:  GENERAL: alert, oriented, appears well and in no acute distress  HEENT: atraumatic, conjunttiva clear, no obvious abnormalities on inspection of  external nose and ears  NECK: normal movements of the head and neck  LUNGS: on inspection no signs of respiratory distress, breathing rate appears normal, no obvious gross SOB, gasping or wheezing  CV: no obvious cyanosis  MS: moves all visible extremities without noticeable abnormality  PSYCH/NEURO: pleasant and cooperative, no obvious depression or anxiety, speech and thought processing grossly intact  ASSESSMENT AND PLAN:  Discussed the following assessment and plan:  COVID-19 vaccine regimen to maintain immunity completed -  Plan: SARS-CoV-2 Semi-Quantitative Total Antibody, Spike  Vitamin D deficiency  Weight gain - Plan: Comprehensive metabolic panel, Lipid panel, TSH  Fatigue, unspecified type - Plan: CBC with Differential/Platelet  Attention deficit hyperactivity disorder (ADHD), combined type - Plan: methylphenidate (RITALIN) 5 MG tablet  Adult attention deficit disorder  Overweight (BMI 25.0-29.9)  Adult attention deficit disorder Patient is functioning  well  without side effects on current dose of  20 mg bid .  We reviewed risks of Ritalin.    Refill for 3 months given.  Follow up in 6 months  Overweight (BMI 25.0-29.9) I have congratulated her in reduction of   BMI and encouraged  Continued weight loss with goal of 10% of body weight over the next 6 months using a low glycemic index diet and regular exercise a minimum of 5 days per week.    COVID-19 vaccine regimen to maintain immunity completed She is undecided about getting a booster dose of the COVID 19 vaccine. covid antibody level ordered   I provided  20 minutes of  face-to-face time during this encounter reviewing patient's current problems and past surgeries, labs and imaging studies, providing counseling on the above mentioned problems , and coordination  of care .   I discussed the assessment and treatment plan with the patient. The patient was provided an opportunity to ask questions and all were answered. The patient agreed with the plan and demonstrated an understanding of the instructions.   The patient was advised to call back or seek an in-person evaluation if the symptoms worsen or if the condition fails to improve as anticipated.  I provided 20 minutes of face-to-face time during this encounter.   Crecencio Mc, MD

## 2020-04-01 DIAGNOSIS — E663 Overweight: Secondary | ICD-10-CM | POA: Insufficient documentation

## 2020-04-01 DIAGNOSIS — Z23 Encounter for immunization: Secondary | ICD-10-CM | POA: Insufficient documentation

## 2020-04-01 NOTE — Assessment & Plan Note (Signed)
She is undecided about getting a booster dose of the COVID 19 vaccine. covid antibody level ordered

## 2020-04-01 NOTE — Assessment & Plan Note (Signed)
Patient is functioning  well  without side effects on current dose of  20 mg bid .  We reviewed risks of Ritalin.    Refill for 3 months given.  Follow up in 6 months

## 2020-04-01 NOTE — Assessment & Plan Note (Signed)
I have congratulated her in reduction of   BMI and encouraged  Continued weight loss with goal of 10% of body weight over the next 6 months using a low glycemic index diet and regular exercise a minimum of 5 days per week.     

## 2020-04-07 ENCOUNTER — Other Ambulatory Visit (INDEPENDENT_AMBULATORY_CARE_PROVIDER_SITE_OTHER): Payer: BC Managed Care – PPO

## 2020-04-07 ENCOUNTER — Other Ambulatory Visit: Payer: Self-pay

## 2020-04-07 DIAGNOSIS — R635 Abnormal weight gain: Secondary | ICD-10-CM

## 2020-04-07 DIAGNOSIS — R5383 Other fatigue: Secondary | ICD-10-CM

## 2020-04-07 DIAGNOSIS — Z23 Encounter for immunization: Secondary | ICD-10-CM | POA: Diagnosis not present

## 2020-04-07 LAB — TSH: TSH: 3.43 u[IU]/mL (ref 0.35–4.50)

## 2020-04-07 LAB — CBC WITH DIFFERENTIAL/PLATELET
Basophils Absolute: 0.1 10*3/uL (ref 0.0–0.1)
Basophils Relative: 1.2 % (ref 0.0–3.0)
Eosinophils Absolute: 0.4 10*3/uL (ref 0.0–0.7)
Eosinophils Relative: 6.5 % — ABNORMAL HIGH (ref 0.0–5.0)
HCT: 40.9 % (ref 36.0–46.0)
Hemoglobin: 13.6 g/dL (ref 12.0–15.0)
Lymphocytes Relative: 31.9 % (ref 12.0–46.0)
Lymphs Abs: 1.9 10*3/uL (ref 0.7–4.0)
MCHC: 33.1 g/dL (ref 30.0–36.0)
MCV: 86.2 fl (ref 78.0–100.0)
Monocytes Absolute: 0.4 10*3/uL (ref 0.1–1.0)
Monocytes Relative: 6 % (ref 3.0–12.0)
Neutro Abs: 3.2 10*3/uL (ref 1.4–7.7)
Neutrophils Relative %: 54.4 % (ref 43.0–77.0)
Platelets: 272 10*3/uL (ref 150.0–400.0)
RBC: 4.74 Mil/uL (ref 3.87–5.11)
RDW: 12.4 % (ref 11.5–15.5)
WBC: 6 10*3/uL (ref 4.0–10.5)

## 2020-04-07 LAB — LIPID PANEL
Cholesterol: 233 mg/dL — ABNORMAL HIGH (ref 0–200)
HDL: 62.4 mg/dL (ref >39.00)
LDL Cholesterol: 157 mg/dL — ABNORMAL HIGH (ref 0–99)
NonHDL: 170.24
Total CHOL/HDL Ratio: 4
Triglycerides: 67 mg/dL (ref 0.0–149.0)
VLDL: 13.4 mg/dL (ref 0.0–40.0)

## 2020-04-07 LAB — COMPREHENSIVE METABOLIC PANEL
ALT: 5 U/L (ref 0–35)
AST: 14 U/L (ref 0–37)
Albumin: 4.2 g/dL (ref 3.5–5.2)
Alkaline Phosphatase: 61 U/L (ref 39–117)
BUN: 8 mg/dL (ref 6–23)
CO2: 31 mEq/L (ref 19–32)
Calcium: 9 mg/dL (ref 8.4–10.5)
Chloride: 102 mEq/L (ref 96–112)
Creatinine, Ser: 0.84 mg/dL (ref 0.40–1.20)
GFR: 82.52 mL/min (ref >60.00)
Glucose, Bld: 94 mg/dL (ref 70–99)
Potassium: 4.1 mEq/L (ref 3.5–5.1)
Sodium: 137 mEq/L (ref 135–145)
Total Bilirubin: 0.5 mg/dL (ref 0.2–1.2)
Total Protein: 6.8 g/dL (ref 6.0–8.3)

## 2020-04-08 LAB — SARS-COV-2 SEMI-QUANTITATIVE TOTAL ANTIBODY, SPIKE
SARS-CoV-2 Semi-Quant Total Ab: 243.1 U/mL (ref <0.8)
SARS-CoV-2 Spike Ab Interp: POSITIVE

## 2020-04-08 NOTE — Progress Notes (Signed)
Your cholesterol has risen slightly and your covid antibody level is on the low side.  Otherwise, Your labs are normal.  No medications is needed at this time,  but I would consider getting the COVID Booster  Regards,   Deborra Medina, MD

## 2020-05-12 ENCOUNTER — Other Ambulatory Visit: Payer: Self-pay | Admitting: Internal Medicine

## 2020-05-12 DIAGNOSIS — F902 Attention-deficit hyperactivity disorder, combined type: Secondary | ICD-10-CM

## 2020-05-12 MED ORDER — METHYLPHENIDATE HCL 5 MG PO TABS: 20.0000 mg | ORAL_TABLET | Freq: Two times a day (BID) | ORAL | 0 refills | Status: DC

## 2020-07-07 ENCOUNTER — Telehealth: Payer: Self-pay

## 2020-07-07 NOTE — Telephone Encounter (Signed)
Sent mychart message in regards to pt's appt on Thursday.

## 2020-07-09 ENCOUNTER — Encounter: Payer: Self-pay | Admitting: Internal Medicine

## 2020-07-09 ENCOUNTER — Telehealth: Payer: BC Managed Care – PPO | Admitting: Internal Medicine

## 2020-07-09 DIAGNOSIS — U071 COVID-19: Secondary | ICD-10-CM

## 2020-07-09 HISTORY — DX: COVID-19: U07.1

## 2020-07-09 MED ORDER — LEVOFLOXACIN 500 MG PO TABS: 500.0000 mg | ORAL_TABLET | Freq: Every day | ORAL | 0 refills | Status: DC

## 2020-07-09 MED ORDER — BENZONATATE 200 MG PO CAPS: 200.0000 mg | ORAL_CAPSULE | Freq: Three times a day (TID) | ORAL | 1 refills | Status: DC | PRN

## 2020-07-09 MED ORDER — HYDROCOD POLST-CPM POLST ER 10-8 MG/5ML PO SUER: 5.0000 mL | Freq: Every evening | ORAL | 0 refills | Status: DC | PRN

## 2020-07-09 MED ORDER — PREDNISONE 10 MG PO TABS: ORAL_TABLET | ORAL | 0 refills | Status: DC

## 2020-07-09 NOTE — Assessment & Plan Note (Signed)
diagnosed on Feb 13.  Feeling better but still having a persistent cough without fever, sputum , dypsnea or pleurisy. Sinuses still congested.  willl treate with prednisone taper,  Cough suppressants.  Advised to start levaquin if sinus drainage or sputum becomes purulent.

## 2020-07-09 NOTE — Progress Notes (Signed)
Virtual Visit via [place]  This visit type was conducted due to national recommendations for restrictions regarding the COVID-19 pandemic (e.g. social distancing).  This format is felt to be most appropriate for this patient at this time.  All issues noted in this document were discussed and addressed.  No physical exam was performed (except for noted visual exam findings with Video Visits).   I connected with@ on 07/09/20 at 11:00 AM EST by a video enabled telemedicine application  and verified that I am speaking with the correct person using two identifiers. Location patient: home Location provider: work or home office Persons participating in the virtual visit: patient, provider  I discussed the limitations, risks, security and privacy concerns of performing an evaluation and management service by telephone and the availability of in person appointments. I also discussed with the patient that there may be a patient responsible charge related to this service. The patient expressed understanding and agreed to proceed.   Reason for visit: post COVID cough   HPI:  50 yr old female presents with persistent disruptive cough, non productive  Following a COVID infection that was diagnosed on Feb 13 .  symptoms during the infection were headache, congestion and cough.  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  She has been taking  OTC cough suppressants without much change.  Denies fevers and dyspnea,  No pleurisy   ROS: See pertinent positives and negatives per HPI.  Past Medical History:  Diagnosis Date  . Abnormal Pap smear of cervix 2010   normal biopsy, followup with GYN  . Attention deficit disorder of adult without mention of hyperactivity    managed with methylphenidate  . Basal cell carcinoma   . Chronic back pain    . Headache     Past Surgical History:  Procedure Laterality Date  . BUNIONECTOMY    . COLPOSCOPY      Family History  Problem Relation Age of Onset  . Hypertension Mother   . Hyperlipidemia Mother   . Hyperlipidemia Father   . Hypertension Father   . Heart disease Maternal Grandfather   . Cancer Paternal Grandmother 54       melanoma  . Breast cancer Cousin        2nd cousin    SOCIAL HX:  reports that she has never smoked. She has never used smokeless tobacco. She reports current alcohol use. She reports that she does not use drugs.   Current Outpatient Medications:  .  benzonatate (TESSALON) 200 MG capsule, Take 1 capsule (200 mg total) by mouth 3 (three) times daily as needed for cough., Disp: 60 capsule, Rfl: 1 .  chlorpheniramine-HYDROcodone (TUSSIONEX PENNKINETIC ER) 10-8 MG/5ML SUER, Take 5 mLs by mouth at bedtime as needed., Disp: 140 mL, Rfl: 0 .  levofloxacin (LEVAQUIN) 500 MG tablet, Take 1 tablet (500 mg total) by mouth daily., Disp: 7 tablet, Rfl: 0 .  methylphenidate (RITALIN) 5 MG tablet, Take 4 tablets (20 mg total) by mouth 2 (two) times daily., Disp: 240 tablet, Rfl: 0 .  methylphenidate (RITALIN) 5 MG tablet, Take 4 tablets (20 mg total) by mouth 2 (two) times daily., Disp: 240 tablet, Rfl: 0 .  methylphenidate (RITALIN) 5 MG tablet, Take 4 tablets (20 mg total) by mouth 2 (two) times daily., Disp: 240 tablet, Rfl: 0 .  [  START ON 08/13/2020] methylphenidate (RITALIN) 5 MG tablet, Take 4 tablets (20 mg total) by mouth 2 (two) times daily., Disp: 240 tablet, Rfl: 0 .  [START ON 07/14/2020] methylphenidate (RITALIN) 5 MG tablet, Take 4 tablets (20 mg total) by mouth 2 (two) times daily., Disp: 240 tablet, Rfl: 0 .  [START ON 09/12/2020] methylphenidate (RITALIN) 5 MG tablet, Take 4 tablets (20 mg total) by mouth 2 (two) times daily., Disp: 240 tablet, Rfl: 0 .  predniSONE (DELTASONE) 10 MG tablet, 6 tablets on Day 1 , then reduce by 1 tablet daily until gone, Disp: 21  tablet, Rfl: 0  EXAM:  VITALS per patient if applicable:  GENERAL: alert, oriented, appears well and in no acute distress  HEENT: atraumatic, conjunttiva clear, no obvious abnormalities on inspection of external nose and ears  NECK: normal movements of the head and neck  LUNGS: on inspection no signs of respiratory distress, breathing rate appears normal, no obvious gross SOB, gasping or wheezing  CV: no obvious cyanosis  MS: moves all visible extremities without noticeable abnormality  PSYCH/NEURO: pleasant and cooperative, no obvious depression or anxiety, speech and thought processing grossly intact  ASSESSMENT AND PLAN:  Discussed the following assessment and plan:  COVID-19 virus infection  COVID-19 virus infection diagnosed on Feb 13.  Feeling better but still having a persistent cough without fever, sputum , dypsnea or pleurisy. Sinuses still congested.  willl treate with prednisone taper,  Cough suppressants.  Advised to start levaquin if sinus drainage or sputum becomes purulent.     I discussed the assessment and treatment plan with the patient. The patient was provided an opportunity to ask questions and all were answered. The patient agreed with the plan and demonstrated an understanding of the instructions.   The patient was advised to call back or seek an in-person evaluation if the symptoms worsen or if the condition fails to improve as anticipated.   I spent 20 minutes dedicated to the care of this patient on the date of this encounter to include pre-visit review of his medical history,  Face-to-face time with the patient , and post visit ordering of testing and therapeutics.    Crecencio Mc, MD

## 2020-07-24 DIAGNOSIS — Z85828 Personal history of other malignant neoplasm of skin: Secondary | ICD-10-CM | POA: Diagnosis not present

## 2020-07-24 DIAGNOSIS — L820 Inflamed seborrheic keratosis: Secondary | ICD-10-CM | POA: Diagnosis not present

## 2020-07-24 DIAGNOSIS — B351 Tinea unguium: Secondary | ICD-10-CM | POA: Diagnosis not present

## 2020-07-24 DIAGNOSIS — Z08 Encounter for follow-up examination after completed treatment for malignant neoplasm: Secondary | ICD-10-CM | POA: Diagnosis not present

## 2020-07-24 DIAGNOSIS — L821 Other seborrheic keratosis: Secondary | ICD-10-CM | POA: Diagnosis not present

## 2020-07-24 DIAGNOSIS — L538 Other specified erythematous conditions: Secondary | ICD-10-CM | POA: Diagnosis not present

## 2020-07-30 ENCOUNTER — Telehealth: Payer: Self-pay | Admitting: Internal Medicine

## 2020-07-30 ENCOUNTER — Other Ambulatory Visit: Payer: Self-pay | Admitting: Internal Medicine

## 2020-07-30 DIAGNOSIS — U099 Post covid-19 condition, unspecified: Secondary | ICD-10-CM

## 2020-07-30 DIAGNOSIS — R053 Chronic cough: Secondary | ICD-10-CM

## 2020-07-30 DIAGNOSIS — U071 COVID-19: Secondary | ICD-10-CM

## 2020-07-30 NOTE — Telephone Encounter (Signed)
Correction she should go to [facility] in the am for the chest x ray

## 2020-07-30 NOTE — Telephone Encounter (Signed)
Pt called she wanted to have a chest xray done because she is still coughing following covid

## 2020-07-30 NOTE — Addendum Note (Signed)
Addended by: Crecencio Mc on: 07/30/2020 04:16 PM   Modules accepted: Orders

## 2020-07-30 NOTE — Telephone Encounter (Signed)
Chest x ray ordered , please offer her a lab appt to have it done tomorrow prior to appt if convenient/possible

## 2020-07-30 NOTE — Telephone Encounter (Signed)
Called to speak with Crystal Reyes, She states that she is having a continued cough from her covid 67 infection from [facility] February. She states that she was told by Dr. Derrel Nip that she could have an Xray done if cough continues past their video visit on 07/09/20. Patient scheduled for a video visit with PCP at 11am on 07/31/2020.

## 2020-07-31 ENCOUNTER — Other Ambulatory Visit: Payer: Self-pay

## 2020-07-31 ENCOUNTER — Telehealth (INDEPENDENT_AMBULATORY_CARE_PROVIDER_SITE_OTHER): Payer: BC Managed Care – PPO | Admitting: Internal Medicine

## 2020-07-31 ENCOUNTER — Ambulatory Visit
Admission: RE | Admit: 2020-07-31 | Discharge: 2020-07-31 | Disposition: A | Discharge status: Home / Self Care | Payer: BC Managed Care – PPO | Source: Ambulatory Visit | Attending: Internal Medicine | Admitting: Internal Medicine

## 2020-07-31 ENCOUNTER — Ambulatory Visit
Admission: RE | Admit: 2020-07-31 | Discharge: 2020-07-31 | Disposition: A | Discharge status: Home / Self Care | Payer: BC Managed Care – PPO | Attending: Internal Medicine | Admitting: Internal Medicine

## 2020-07-31 DIAGNOSIS — R053 Chronic cough: Secondary | ICD-10-CM

## 2020-07-31 DIAGNOSIS — U099 Post covid-19 condition, unspecified: Secondary | ICD-10-CM | POA: Diagnosis not present

## 2020-07-31 DIAGNOSIS — R059 Cough, unspecified: Secondary | ICD-10-CM | POA: Diagnosis not present

## 2020-07-31 MED ORDER — CHERATUSSIN AC 100-10 MG/5ML PO SOLN: 5.0000 mL | Freq: Three times a day (TID) | ORAL | 0 refills | Status: DC | PRN

## 2020-07-31 MED ORDER — FLOVENT HFA 220 MCG/ACT IN AERO: 2.0000 | INHALATION_SPRAY | Freq: Every day | RESPIRATORY_TRACT | 0 refills | Status: DC

## 2020-07-31 NOTE — Telephone Encounter (Signed)
Called and spoke to [place]. She is aware and is going to the hospital now for her xray.

## 2020-07-31 NOTE — Patient Instructions (Signed)
Continue a once daily antihistamine   Adding Flovent inhaler:  2 puffs daily (steroid inhaler)  cheratussin cough syrup with codeine   Chest x ray looked ok but official report is still pending   If no better by Monday,  We can add another prednisone taper

## 2020-07-31 NOTE — Progress Notes (Signed)
Virtual Visit via [place]  This visit type was conducted due to national recommendations for restrictions regarding the COVID-19 pandemic (e.g. social distancing).  This format is felt to be most appropriate for this patient at this time.  All issues noted in this document were discussed and addressed.  No physical exam was performed (except for noted visual exam findings with Video Visits).   I connected with@ on 07/31/20 at 11:00 AM EDT by a video enabled telemedicine application and verified that I am speaking with the correct person using two identifiers. Location patient: home Location provider: work or home office Persons participating in the virtual visit: patient, provider  I discussed the limitations, risks, security and privacy concerns of performing an evaluation and management service by telephone and the availability of in person appointments. I also discussed with the patient that there may be a patient responsible charge related to this service. The patient expressed understanding and agreed to proceed.   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 Patient reports persistent productive sounding cough that is infrequent but annoying,  Not keeping her up at night .  Denies wheezing,  Dyspnea with exertion   Reviewed chest x ray done today at [facility] prior to video visit. No infiltrates or scarring seen.    ROS: See pertinent positives and negatives per HPI.  Past Medical History:  Diagnosis Date  . Abnormal Pap smear of cervix 2010   normal biopsy, followup with GYN  . Attention deficit disorder of adult without mention of hyperactivity    managed with methylphenidate  . Basal cell carcinoma   . Chronic back pain   . COVID-19 virus infection 07/09/2020  . Headache     Past Surgical History:  Procedure Laterality Date  . BUNIONECTOMY    . COLPOSCOPY      Family  History  Problem Relation Age of Onset  . Hypertension Mother   . Hyperlipidemia Mother   . Hyperlipidemia Father   . Hypertension Father   . Heart disease Maternal Grandfather   . Cancer Paternal Grandmother 71       melanoma  . Breast cancer Cousin        2nd cousin    SOCIAL HX:  reports that she has never smoked. She has never used smokeless tobacco. She reports current alcohol use. She reports that she does not use drugs.   Current Outpatient Medications:  .  fluticasone (FLOVENT HFA) 220 MCG/ACT inhaler, Inhale 2 puffs into the lungs daily., Disp: 1 each, Rfl: 0 .  guaiFENesin-codeine (CHERATUSSIN AC) 100-10 MG/5ML syrup, Take 5 mLs by mouth 3 (three) times daily as needed for cough., Disp: 120 mL, Rfl: 0 .  benzonatate (TESSALON) 200 MG capsule, Take 1 capsule (200 mg total) by mouth 3 (three) times daily as needed for cough., Disp: 60 capsule, Rfl: 1 .  fluconazole (DIFLUCAN) 200 MG tablet, Take by mouth., Disp: , Rfl:  .  methylphenidate (RITALIN) 5 MG tablet, Take 4 tablets (20 mg total) by mouth 2 (two) times daily., Disp: 240 tablet, Rfl: 0 .  methylphenidate (RITALIN) 5 MG tablet, Take 4 tablets (20 mg total) by mouth 2 (two) times daily., Disp: 240 tablet, Rfl: 0 .  methylphenidate (RITALIN) 5 MG tablet, Take 4 tablets (20 mg total) by mouth 2 (two) times daily., Disp: 240 tablet, Rfl: 0 .  [START ON 08/13/2020] methylphenidate (RITALIN)  5 MG tablet, Take 4 tablets (20 mg total) by mouth 2 (two) times daily., Disp: 240 tablet, Rfl: 0 .  methylphenidate (RITALIN) 5 MG tablet, Take 4 tablets (20 mg total) by mouth 2 (two) times daily., Disp: 240 tablet, Rfl: 0 .  [START ON 09/12/2020] methylphenidate (RITALIN) 5 MG tablet, Take 4 tablets (20 mg total) by mouth 2 (two) times daily., Disp: 240 tablet, Rfl: 0  EXAM:  VITALS per patient if applicable:  GENERAL: alert, oriented, appears well and in no acute distress  HEENT: atraumatic, conjunttiva clear, no obvious abnormalities  on inspection of external nose and ears  NECK: normal movements of the head and neck  LUNGS: on inspection no signs of respiratory distress, breathing rate appears normal, no obvious gross SOB, gasping or wheezing  CV: no obvious cyanosis  MS: moves all visible extremities without noticeable abnormality  PSYCH/NEURO: pleasant and cooperative, no obvious depression or anxiety, speech and thought processing grossly intact  ASSESSMENT AND PLAN:  Discussed the following assessment and plan:  Post-COVID chronic cough  Post-COVID chronic cough Chest xray reassuring.  Recommend daily use of antihistamine ,a steroid inhaler for a few weeks  and use of cough suppressant     I discussed the assessment and treatment plan with the patient. The patient was provided an opportunity to ask questions and all were answered. The patient agreed with the plan and demonstrated an understanding of the instructions.   The patient was advised to call back or seek an in-person evaluation if the symptoms worsen or if the condition fails to improve as anticipated.   I spent  20 minutes dedicated to the care of this patient on the date of this encounter to include pre-visit review of his medical history,  Face-to-face time with the patient , and post visit ordering of testing and therapeutics.    Crecencio Mc, MD

## 2020-08-02 ENCOUNTER — Encounter: Payer: Self-pay | Admitting: Internal Medicine

## 2020-08-02 DIAGNOSIS — R053 Chronic cough: Secondary | ICD-10-CM | POA: Insufficient documentation

## 2020-08-02 DIAGNOSIS — U099 Post covid-19 condition, unspecified: Secondary | ICD-10-CM | POA: Insufficient documentation

## 2020-08-02 NOTE — Assessment & Plan Note (Signed)
Chest xray reassuring.  Recommend daily use of antihistamine ,a steroid inhaler for a few weeks  and use of cough suppressant

## 2020-08-02 NOTE — Addendum Note (Signed)
Addended by: Crecencio Mc on: 08/02/2020 05:04 PM   Modules accepted: Level of Service

## 2020-08-30 ENCOUNTER — Other Ambulatory Visit: Payer: Self-pay | Admitting: Internal Medicine

## 2020-09-29 ENCOUNTER — Ambulatory Visit: Payer: BC Managed Care – PPO | Admitting: Internal Medicine

## 2020-10-14 ENCOUNTER — Ambulatory Visit: Payer: BC Managed Care – PPO | Admitting: Internal Medicine

## 2020-10-15 ENCOUNTER — Encounter: Payer: Self-pay | Admitting: Internal Medicine

## 2020-10-15 ENCOUNTER — Telehealth (INDEPENDENT_AMBULATORY_CARE_PROVIDER_SITE_OTHER): Payer: BC Managed Care – PPO | Admitting: Internal Medicine

## 2020-10-15 DIAGNOSIS — U099 Post covid-19 condition, unspecified: Secondary | ICD-10-CM | POA: Diagnosis not present

## 2020-10-15 DIAGNOSIS — F902 Attention-deficit hyperactivity disorder, combined type: Secondary | ICD-10-CM | POA: Diagnosis not present

## 2020-10-15 DIAGNOSIS — R053 Chronic cough: Secondary | ICD-10-CM

## 2020-10-15 DIAGNOSIS — F988 Other specified behavioral and emotional disorders with onset usually occurring in childhood and adolescence: Secondary | ICD-10-CM | POA: Diagnosis not present

## 2020-10-15 MED ORDER — METHYLPHENIDATE HCL 5 MG PO TABS: 20.0000 mg | ORAL_TABLET | Freq: Two times a day (BID) | ORAL | 0 refills | Status: DC

## 2020-10-15 NOTE — Assessment & Plan Note (Signed)
Patient is functioning  well  without side effects on current dose of  20 mg bid .  We reviewed risks of Ritalin.    Refill for 3 months given.  Follow up in 6 months

## 2020-10-15 NOTE — Progress Notes (Signed)
Virtual Visit via [place]  This visit type was conducted due to national recommendations for restrictions regarding the COVID-19 pandemic (e.g. social distancing).  This format is felt to be most appropriate for this patient at this time.  All issues noted in this document were discussed and addressed.  No physical exam was performed (except for noted visual exam findings with Video Visits).   I connected withNAME@ on 10/15/20 at 11:00 AM EDT by a video enabled telemedicine application  and verified that I am speaking with the correct person using two identifiers. Location patient: home Location provider: work or home office Persons participating in the virtual visit: patient, provider  I discussed the limitations, risks, security and privacy concerns of performing an evaluation and management service by telephone and the availability of in person appointments. I also discussed with the patient that there may be a patient responsible charge related to this service. The patient expressed understanding and agreed to proceed.  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  Her cough has resolved.  She has no residual symptoms of COVID.  Her daughter is graduating from high school this weekend and the event is indoors and will likely be unmasked.    ADD:  she is functioning well on current dose but has been having delays in refills due to [facility] availability of product on refill dates.    ROS: See pertinent positives and negatives per HPI.  Past Medical History:  Diagnosis Date   Abnormal Pap smear of cervix 2010   normal biopsy, followup with GYN   Attention deficit disorder of adult without mention of hyperactivity    managed with methylphenidate   Basal cell carcinoma    Chronic back pain    COVID-19 virus infection 07/09/2020   Headache      Past Surgical History:  Procedure Laterality Date   BUNIONECTOMY     COLPOSCOPY      Family History  Problem Relation Age of Onset   Hypertension Mother    Hyperlipidemia Mother    Hyperlipidemia Father    Hypertension Father    Heart disease Maternal Grandfather    Cancer Paternal Grandmother 74       melanoma   Breast cancer Cousin        2nd cousin    SOCIAL HX.:   reports that she has never smoked. She has never used smokeless tobacco. She reports current alcohol use. She reports that she does not use drugs.    Current Outpatient Medications:    FLOVENT HFA 220 MCG/ACT inhaler, INHALE 2 PUFFS BY MOUTH INTO THE LUNGS DAILY, Disp: 12 each, Rfl: 1   fluconazole (DIFLUCAN) 200 MG tablet, Take by mouth., Disp: , Rfl:    methylphenidate (RITALIN) 5 MG tablet, Take 4 tablets (20 mg total) by mouth 2 (two) times daily., Disp: 240 tablet, Rfl: 0   methylphenidate (RITALIN) 5 MG tablet, Take 4 tablets (20 mg total) by mouth 2 (two) times daily., Disp: 240 tablet, Rfl: 0   methylphenidate (RITALIN) 5 MG tablet, Take 4 tablets (20 mg total) by mouth 2 (two) times daily., Disp: 240 tablet, Rfl: 0   [START ON 12/14/2020] methylphenidate (RITALIN) 5 MG tablet, Take 4 tablets (20 mg total) by mouth 2 (two) times daily., Disp: 240 tablet, Rfl: 0   [START ON 11/14/2020] methylphenidate (  RITALIN) 5 MG tablet, Take 4 tablets (20 mg total) by mouth 2 (two) times daily., Disp: 240 tablet, Rfl: 0   methylphenidate (RITALIN) 5 MG tablet, Take 4 tablets (20 mg total) by mouth 2 (two) times daily., Disp: 240 tablet, Rfl: 0  EXAM:  VITALS per patient if applicable:  GENERAL: alert, oriented, appears well and in no acute distress  HEENT: atraumatic, conjunttiva clear, no obvious abnormalities on inspection of external nose and ears  NECK: normal movements of the head and neck  LUNGS: on inspection no signs of respiratory distress, breathing rate appears normal, no obvious gross SOB, gasping or  wheezing  CV: no obvious cyanosis  MS: moves all visible extremities without noticeable abnormality  PSYCH/NEURO: pleasant and cooperative, no obvious depression or anxiety, speech and thought processing grossly intact  ASSESSMENT AND PLAN:  Discussed the following assessment and plan:  Attention deficit hyperactivity disorder (ADHD), combined type - Plan: methylphenidate (RITALIN) 5 MG tablet, methylphenidate (RITALIN) 5 MG tablet  Adult attention deficit disorder  Post-COVID chronic cough  Adult attention deficit disorder Patient is functioning  well  without side effects on current dose of  20 mg bid .  We reviewed risks of Ritalin.    Refill for 3 months given.  Follow up in 6 months  Post-COVID chronic cough Resolved.  No further treatment needed.  Advised of the increase in infections noted in community and warned  To consider masking at her daughter's graduation this weekend    I discussed the assessment and treatment plan with the patient. The patient was provided an opportunity to ask questions and all were answered. The patient agreed with the plan and demonstrated an understanding of the instructions.   The patient was advised to call back or seek an in-person evaluation if the symptoms worsen or if the condition fails to improve as anticipated.   I spent 20 minutes dedicated to the care of this patient on the date of this virtual encounter to include pre-visit review of his medical history,  Face-to-face time with the patient , and post visit ordering of testing and therapeutics.    Crecencio Mc, MD

## 2020-10-15 NOTE — Assessment & Plan Note (Signed)
Resolved.  No further treatment needed.  Advised of the increase in infections noted in community and warned  To consider masking at her daughter's graduation this weekend

## 2020-11-24 ENCOUNTER — Telehealth: Payer: Self-pay | Admitting: Internal Medicine

## 2020-11-24 ENCOUNTER — Telehealth: Payer: BC Managed Care – PPO | Admitting: Physician Assistant

## 2020-11-24 DIAGNOSIS — U071 COVID-19: Secondary | ICD-10-CM | POA: Diagnosis not present

## 2020-11-24 MED ORDER — PROMETHAZINE-DM 6.25-15 MG/5ML PO SYRP: 5.0000 mL | ORAL_SOLUTION | Freq: Four times a day (QID) | ORAL | 0 refills | Status: DC | PRN

## 2020-11-24 MED ORDER — MOLNUPIRAVIR EUA 200MG CAPSULE: 4.0000 | ORAL_CAPSULE | Freq: Two times a day (BID) | ORAL | 0 refills | Status: AC

## 2020-11-24 NOTE — Telephone Encounter (Signed)
Crystal Reyes was on the phone with the front desk during my call and was transferred over. Pt states she tested positive on 11/23/20 and is c/o sore throat, headache, fatigue, nasal congestion x1week. Pt requests the Paxlovid medication and states that she was told by Dr. Derrel Nip to call back for a visit if she begins to show any covid symptoms. Informed patient that we had no appointments available today and that Dr. Derrel Nip would want her to be evaluated. [name] through the process to access Mychart Urgent Care Video Visit. Crystal Reyes verbalized understanding and had no further questions. She will contact a doctor tonight to be seen and evaluated.

## 2020-11-24 NOTE — Telephone Encounter (Signed)
Left message to call back  

## 2020-11-24 NOTE — Telephone Encounter (Signed)
Patient called and said that Dr Derrel Nip told her to call her if she was exposed to Covid on her trip, and she would prescribe medication for her.Patient said she just tested positive for covid.

## 2020-11-24 NOTE — Patient Instructions (Signed)
Crystal Reyes, thank you for joining Leeanne Rio, PA-C for today's virtual visit.  While this provider is not your primary care provider (PCP), if your PCP is located in our provider database this encounter information will be shared with them immediately following your visit.  Consent: (Patient) Crystal Reyes provided verbal consent for this virtual visit at the beginning of the encounter.  Current Medications:  Current Outpatient Medications:    FLOVENT HFA 220 MCG/ACT inhaler, INHALE 2 PUFFS BY MOUTH INTO THE LUNGS DAILY, Disp: 12 each, Rfl: 1   fluconazole (DIFLUCAN) 200 MG tablet, Take by mouth., Disp: , Rfl:    methylphenidate (RITALIN) 5 MG tablet, Take 4 tablets (20 mg total) by mouth 2 (two) times daily., Disp: 240 tablet, Rfl: 0   methylphenidate (RITALIN) 5 MG tablet, Take 4 tablets (20 mg total) by mouth 2 (two) times daily., Disp: 240 tablet, Rfl: 0   methylphenidate (RITALIN) 5 MG tablet, Take 4 tablets (20 mg total) by mouth 2 (two) times daily., Disp: 240 tablet, Rfl: 0   [START ON 12/14/2020] methylphenidate (RITALIN) 5 MG tablet, Take 4 tablets (20 mg total) by mouth 2 (two) times daily., Disp: 240 tablet, Rfl: 0   methylphenidate (RITALIN) 5 MG tablet, Take 4 tablets (20 mg total) by mouth 2 (two) times daily., Disp: 240 tablet, Rfl: 0   methylphenidate (RITALIN) 5 MG tablet, Take 4 tablets (20 mg total) by mouth 2 (two) times daily., Disp: 240 tablet, Rfl: 0   Medications ordered in this encounter:  No orders of the defined types were placed in this encounter.    *If you need refills on other medications prior to your next appointment, please contact your pharmacy*  Follow-Up: Call back or seek an in-person evaluation if the symptoms worsen or if the condition fails to improve as anticipated.  Other Instructions Please keep well-hydrated and get plenty of rest. Start a saline nasal rinse to flush out your nasal passages. You can use  plain Mucinex to help thin congestion. If you have a humidifier, running in the bedroom at night. I want you to start OTC vitamin D3 1000 units daily, vitamin C 1000 mg daily, and a zinc supplement. Please take prescribed medications as directed.  You have been enrolled in a MyChart symptom monitoring program. Please answer these questions daily so we can keep track of how you are doing.  You were to quarantine for 5 days from onset of your symptoms.  After day 5, if you have had no fever and you are feeling better, you can end quarantine but need to mask for an additional 5 days. After day 5 if you have a fever or are having significant symptoms, please quarantine for full 10 days.  If you note any worsening of symptoms, any significant shortness of breath or any chest pain, please seek ER evaluation ASAP.  Please do not delay care!    If you have been instructed to have an in-person evaluation today at a local Urgent Care facility, please use the link below. It will take you to a list of all of our available [facility], including address, phone number and hours of operation. Please do not delay care.  [facility]  If you or a family member do not have a primary care provider, use the link below to schedule a visit and establish care. When you choose a [facility] primary care physician or advanced practice provider, you gain a long-term  partner in health. Find a Primary Care Provider  Learn more about [facility] in-office and virtual care options: [facility]

## 2020-11-24 NOTE — Progress Notes (Signed)
Virtual Visit Consent   Crystal Reyes, you are scheduled for a virtual visit with a [facility] provider today.     Just as with appointments in the office, your consent must be obtained to participate.  Your consent will be active for this visit and any virtual visit you may have with one of our providers in the next 365 days.     If you have a MyChart account, a copy of this consent can be sent to you electronically.  All virtual visits are billed to your insurance company just like a traditional visit in the office.    As this is a virtual visit, video technology does not allow for your provider to perform a traditional examination.  This may limit your provider's ability to fully assess your condition.  If your provider identifies any concerns that need to be evaluated in person or the need to arrange testing (such as labs, EKG, etc.), we will make arrangements to do so.     Although advances in technology are sophisticated, we cannot ensure that it will always work on either your end or our end.  If the connection with a video visit is poor, the visit may have to be switched to a telephone visit.  With either a video or telephone visit, we are not always able to ensure that we have a secure connection.     I need to obtain your verbal consent now.   Are you willing to proceed with your visit today?    Crystal Reyes has provided verbal consent on 11/24/2020 for a virtual visit (video or telephone).   Leeanne Rio, [place]   Date: 11/24/2020 4:49 PM   Virtual Visit via Video Note   I, Leeanne Rio, connected with  Crystal Reyes  ([id number], [birth year]) on 11/24/20 at  4:30 PM EDT by a video-enabled telemedicine application and verified that I am speaking with the correct person using two identifiers.  Location: Patient: Virtual Visit Location Patient: Home Provider: Virtual Visit Location Provider: Home Office   I discussed the limitations  of evaluation and management by telemedicine and the availability of in person appointments. The patient expressed understanding and agreed to proceed.    History of Present Illness: Crystal Reyes is a 50 y.o. who identifies as a female who was assigned female at birth, and is being seen today for COVID-19.   Endorses symptoms starting a couple of days ago. Now with significant sore throat, cough, congestion. Notes substantial fatigue with moderate headache. Denies aches, chills, ear pain or tooth pain. No noted objective fever. Took initial COVID test Friday which was negative. Tested again last night and was positive. Had previously talked with her PCP after last bout of COVID at beginning of year and was told she should get an antiviral if infected again.   HPI: HPI  Problems:  Patient Active Problem List   Diagnosis Date Noted   Post-COVID chronic cough 08/02/2020   Overweight (BMI 25.0-29.9) 04/01/2020   COVID-19 vaccine regimen to maintain immunity completed 04/01/2020   Vitamin D deficiency 08/30/2015   Thoracic spondylosis without myelopathy 08/25/2015   Routine general medical examination at a health care facility 04/29/2012   Adult attention deficit disorder    Abnormal Pap smear of cervix     Allergies:  Allergies  Allergen Reactions   Codeine     Headache   Medications:  Current Outpatient Medications:    molnupiravir EUA 200 mg CAPS, Take  4 capsules (800 mg total) by mouth 2 (two) times daily for 5 days., Disp: 40 capsule, Rfl: 0   promethazine-dextromethorphan (PROMETHAZINE-DM) 6.25-15 MG/5ML syrup, Take 5 mLs by mouth 4 (four) times daily as needed for cough., Disp: 118 mL, Rfl: 0   FLOVENT HFA 220 MCG/ACT inhaler, INHALE 2 PUFFS BY MOUTH INTO THE LUNGS DAILY, Disp: 12 each, Rfl: 1   methylphenidate (RITALIN) 5 MG tablet, Take 4 tablets (20 mg total) by mouth 2 (two) times daily., Disp: 240 tablet, Rfl: 0   methylphenidate (RITALIN) 5 MG tablet, Take 4  tablets (20 mg total) by mouth 2 (two) times daily., Disp: 240 tablet, Rfl: 0   methylphenidate (RITALIN) 5 MG tablet, Take 4 tablets (20 mg total) by mouth 2 (two) times daily., Disp: 240 tablet, Rfl: 0   [START ON 12/14/2020] methylphenidate (RITALIN) 5 MG tablet, Take 4 tablets (20 mg total) by mouth 2 (two) times daily., Disp: 240 tablet, Rfl: 0   methylphenidate (RITALIN) 5 MG tablet, Take 4 tablets (20 mg total) by mouth 2 (two) times daily., Disp: 240 tablet, Rfl: 0   methylphenidate (RITALIN) 5 MG tablet, Take 4 tablets (20 mg total) by mouth 2 (two) times daily., Disp: 240 tablet, Rfl: 0  Observations/Objective: Patient is well-developed, well-nourished in no acute distress.  Resting comfortably at home.  Head is normocephalic, atraumatic.  No labored breathing. Speech is clear and coherent with logical content.  Patient is alert and oriented at baseline.   Assessment and Plan: 1. COVID-19 - molnupiravir EUA 200 mg CAPS; Take 4 capsules (800 mg total) by mouth 2 (two) times daily for 5 days.  Dispense: 40 capsule; Refill: 0 - MyChart COVID-19 home monitoring program; Future - promethazine-dextromethorphan (PROMETHAZINE-DM) 6.25-15 MG/5ML syrup; Take 5 mLs by mouth 4 (four) times daily as needed for cough.  Dispense: 118 mL; Refill: 0 COVID +. Discussed pros/cons of antiviral therapies, including potential ADRs. she would like to proceed with treatment. Rx molnupiravir to take as directed. Supportive measures, OTC medications and Vitamin regimen reviewed. Rx Promethazine-DM. Patient enrolled in [facility] monitoring program through [facility]. Quarantine reviewed in detail. Strict ER precautions discussed.  Follow Up Instructions: I discussed the assessment and treatment plan with the patient. The patient was provided an opportunity to ask questions and all were answered. The patient agreed with the plan and demonstrated an understanding of the instructions.  A copy of instructions were sent to  the patient via MyChart.  The patient was advised to call back or seek an in-person evaluation if the symptoms worsen or if the condition fails to improve as anticipated.  Time:  I spent 18 minutes with the patient via telehealth technology discussing the above problems/concerns.    Leeanne Rio, PA-C

## 2020-11-30 ENCOUNTER — Encounter (INDEPENDENT_AMBULATORY_CARE_PROVIDER_SITE_OTHER): Payer: Self-pay

## 2020-11-30 ENCOUNTER — Telehealth: Payer: Self-pay | Admitting: *Deleted

## 2020-11-30 NOTE — Telephone Encounter (Signed)
BPA triggered for worsening cough. Completed oral anti-viral yesterday. States woke up with dry, non-productive cough this AM. No SOB. States she has RX cough medicine that was called in but has not taken as of yet. Care advised provided. Advised to alert PCP for worsening, productive yellow/green sputum, any SOB. Verbalizes understanding.

## 2020-12-21 DIAGNOSIS — F902 Attention-deficit hyperactivity disorder, combined type: Secondary | ICD-10-CM

## 2020-12-22 MED ORDER — METHYLPHENIDATE HCL 5 MG PO TABS: 20.0000 mg | ORAL_TABLET | Freq: Two times a day (BID) | ORAL | 0 refills | Status: DC

## 2020-12-23 NOTE — Telephone Encounter (Signed)
For your information  

## 2021-01-08 DIAGNOSIS — D225 Melanocytic nevi of trunk: Secondary | ICD-10-CM | POA: Diagnosis not present

## 2021-01-08 DIAGNOSIS — L821 Other seborrheic keratosis: Secondary | ICD-10-CM | POA: Diagnosis not present

## 2021-01-08 DIAGNOSIS — B351 Tinea unguium: Secondary | ICD-10-CM | POA: Diagnosis not present

## 2021-04-08 ENCOUNTER — Telehealth (INDEPENDENT_AMBULATORY_CARE_PROVIDER_SITE_OTHER): Payer: BC Managed Care – PPO | Admitting: Internal Medicine

## 2021-04-08 ENCOUNTER — Encounter: Payer: Self-pay | Admitting: Internal Medicine

## 2021-04-08 VITALS — Ht 64.0 in | Wt 144.0 lb

## 2021-04-08 DIAGNOSIS — Z1211 Encounter for screening for malignant neoplasm of colon: Secondary | ICD-10-CM

## 2021-04-08 DIAGNOSIS — F902 Attention-deficit hyperactivity disorder, combined type: Secondary | ICD-10-CM

## 2021-04-08 DIAGNOSIS — F988 Other specified behavioral and emotional disorders with onset usually occurring in childhood and adolescence: Secondary | ICD-10-CM | POA: Diagnosis not present

## 2021-04-08 DIAGNOSIS — E663 Overweight: Secondary | ICD-10-CM

## 2021-04-08 DIAGNOSIS — Z1231 Encounter for screening mammogram for malignant neoplasm of breast: Secondary | ICD-10-CM

## 2021-04-08 MED ORDER — METHYLPHENIDATE HCL 5 MG PO TABS: 5.0000 mg | ORAL_TABLET | Freq: Two times a day (BID) | ORAL | 0 refills | Status: DC

## 2021-04-08 MED ORDER — METHYLPHENIDATE HCL 20 MG PO TABS
20.0000 mg | ORAL_TABLET | Freq: Two times a day (BID) | ORAL | 0 refills | Status: DC
Start: 2021-04-14 — End: 2021-05-18

## 2021-04-08 NOTE — Assessment & Plan Note (Signed)
Patient is functioning  well  without side effects on current dose of  20 mg bid however the pharmacy is frequently "out of supply" when she requests a refill .  I think the refill issue on your methylphenidate might be avoided if we change her tablets to the 20 mg tablets.  (The pharmacy  won't have to dispense 240/month) , so I sent in a 5 mg refill for #60 tablets that can be refilled now,  and a 20 mg refill for #60 tablets that can be refilled at the appropriate time

## 2021-04-08 NOTE — Patient Instructions (Signed)
Paige,  I think the refill issue on your methylphenidate might be avoided if we change your tablets to the 20 mg tablets.  (The pharmacy  won't have to dispense 240/month) , so I sent in a 5 mg refill for #60 tablets that can be refilled now,  and a 20 mg refill for #60 tablets that can be refilled at the appropriate time .  Let me know if there are any problems.   Regards,   Deborra Medina, MD

## 2021-04-08 NOTE — Progress Notes (Signed)
Virtual Visit converted to telephone note  This visit type was conducted due to national recommendations for restrictions regarding the COVID-19 pandemic (e.g. social distancing).  This format is felt to be most appropriate for this patient at this time.  All issues noted in this document were discussed and addressed.  No physical exam was performed (except for noted visual exam findings with Video Visits).   I cwas unable to connect by virtual  withNAME@ on 04/08/21 at 12:00 PM EST so the entire visit was done by  telephone and verified that I am speaking with the correct person using two identifiers. Location patient: home Location provider: work or home office Persons participating in the virtual visit: patient, provider  I discussed the limitations, risks, security and privacy concerns of performing an evaluation and management service by telephone and the availability of in person appointments. I also discussed with the patient that there may be a patient responsible charge related to this service. The patient expressed understanding and agreed to proceed.  Interactive audio and video telecommunications were attempted between this provider and patient, however failed, due to patient having technical difficulties   We continued and completed visit with audio only.   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 Lately the pharmacy has been unable to fill on the 31st day due to supply issues  .  Since she has returned to full time work in august ,  she has needed the full dose to concentrate and complete her caseload   2 daughters in college,  one type [employer] [employer]) and one who is laid back at [employer]).   ROS: See pertinent positives and negatives per HPI.  Past Medical History:  Diagnosis Date   Abnormal Pap smear of cervix 2010   normal biopsy, followup with GYN   Attention deficit disorder of  adult without mention of hyperactivity    managed with methylphenidate   Basal cell carcinoma    Chronic back pain    COVID-19 virus infection 07/09/2020   Headache     Past Surgical History:  Procedure Laterality Date   BUNIONECTOMY     COLPOSCOPY      Family History  Problem Relation Age of Onset   Hypertension Mother    Hyperlipidemia Mother    Hyperlipidemia Father    Hypertension Father    Heart disease Maternal Grandfather    Cancer Paternal Grandmother 82       melanoma   Breast cancer Cousin        2nd cousin    SOCIAL HX:  reports that she has never smoked. She has never used smokeless tobacco. She reports current alcohol use. She reports that she does not use drugs.    Current Outpatient Medications:    fluconazole (DIFLUCAN) 200 MG tablet, Take 200 mg by mouth once a week., Disp: , Rfl:    methylphenidate (RITALIN) 5 MG tablet, Take 4 tablets (20 mg total) by mouth 2 (two) times daily., Disp: 240 tablet, Rfl: 0   methylphenidate (RITALIN) 5 MG tablet, Take 4 tablets (20 mg total) by mouth 2 (two) times daily., Disp: 240 tablet, Rfl: 0   methylphenidate (RITALIN) 5 MG tablet, Take 4 tablets (20 mg total) by mouth 2 (two) times daily., Disp: 240 tablet, Rfl: 0   methylphenidate (RITALIN) 5 MG tablet, Take 4 tablets (20 mg total) by mouth  2 (two) times daily., Disp: 240 tablet, Rfl: 0   [START ON 04/14/2021] methylphenidate (RITALIN) 20 MG tablet, Take 1 tablet (20 mg total) by mouth 2 (two) times daily. With 5 mg tablet twice daily, Disp: 60 tablet, Rfl: 0   methylphenidate (RITALIN) 5 MG tablet, Take 1 tablet (5 mg total) by mouth 2 (two) times daily. TAKE WITH 20 MG DOSE TWICE Daily, Disp: 60 tablet, Rfl: 0  EXAM:  General impression: alert, cooperative and articulate.  No signs of being in distress  Lungs: speech is fluent sentence length suggests that patient is not short of breath and not punctuated by cough, sneezing or sniffing. Marland Kitchen   Psych: affect normal.   speech is articulate and non pressured .  Denies suicidal thoughts    ASSESSMENT AND PLAN:  Discussed the following assessment and plan:  Colon cancer screening - Plan: Cologuard  Encounter for screening mammogram for malignant neoplasm of breast - Plan: MM 3D SCREEN BREAST BILATERAL  Attention deficit hyperactivity disorder (ADHD), combined type - Plan: methylphenidate (RITALIN) 5 MG tablet  Adult attention deficit disorder  Overweight (BMI 25.0-29.9)  Adult attention deficit disorder Patient is functioning  well  without side effects on current dose of  20 mg bid however the pharmacy is frequently "out of supply" when she requests a refill .  I think the refill issue on your methylphenidate might be avoided if we change her tablets to the 20 mg tablets.  (The pharmacy  won't have to dispense 240/month) , so I sent in a 5 mg refill for #60 tablets that can be refilled now,  and a 20 mg refill for #60 tablets that can be refilled at the appropriate time   Overweight (BMI 25.0-29.9) She and Lanny Hurst have been modifying diet in an effort to lose weight and her current home weight is 142 lbs.     I discussed the assessment and treatment plan with the patient. The patient was provided an opportunity to ask questions and all were answered. The patient agreed with the plan and demonstrated an understanding of the instructions.   The patient was advised to call back or seek an in-person evaluation if the symptoms worsen or if the condition fails to improve as anticipated.   I spent 30 minutes dedicated to the care of this patient on the date of this encounter to include pre-visit review of her medical history,  Face-to-face time with the patient , and post visit ordering of testing and therapeutics.    Crecencio Mc, MD

## 2021-04-08 NOTE — Assessment & Plan Note (Signed)
She and Lanny Hurst have been modifying diet in an effort to lose weight and her current home weight is 142 lbs.

## 2021-05-07 ENCOUNTER — Encounter: Payer: Self-pay | Admitting: Internal Medicine

## 2021-05-07 NOTE — Telephone Encounter (Signed)
I have pended the referral.

## 2021-05-09 ENCOUNTER — Telehealth: Payer: BC Managed Care – PPO | Admitting: Emergency Medicine

## 2021-05-09 DIAGNOSIS — J069 Acute upper respiratory infection, unspecified: Secondary | ICD-10-CM | POA: Diagnosis not present

## 2021-05-09 MED ORDER — BENZONATATE 100 MG PO CAPS: 100.0000 mg | ORAL_CAPSULE | Freq: Two times a day (BID) | ORAL | 0 refills | Status: DC | PRN

## 2021-05-09 MED ORDER — PREDNISONE 10 MG (21) PO TBPK: ORAL_TABLET | Freq: Every day | ORAL | 0 refills | Status: DC

## 2021-05-09 NOTE — Progress Notes (Signed)
Virtual Visit Consent   Crystal Reyes, you are scheduled for a virtual visit with a [facility] provider today.     Just as with appointments in the office, your consent must be obtained to participate.  Your consent will be active for this visit and any virtual visit you may have with one of our providers in the next 365 days.     If you have a MyChart account, a copy of this consent can be sent to you electronically.  All virtual visits are billed to your insurance company just like a traditional visit in the office.    As this is a virtual visit, video technology does not allow for your provider to perform a traditional examination.  This may limit your provider's ability to fully assess your condition.  If your provider identifies any concerns that need to be evaluated in person or the need to arrange testing (such as labs, EKG, etc.), we will make arrangements to do so.     Although advances in technology are sophisticated, we cannot ensure that it will always work on either your end or our end.  If the connection with a video visit is poor, the visit may have to be switched to a telephone visit.  With either a video or telephone visit, we are not always able to ensure that we have a secure connection.     I need to obtain your verbal consent now.   Are you willing to proceed with your visit today? Yes   Crystal Reyes has provided verbal consent on 05/09/2021 for a virtual visit (video or telephone).   Crystal Reyes, [place]   Date: 05/09/2021 10:31 AM   Virtual Visit via Video Note   I, Crystal Reyes, connected with  Crystal Reyes  ([id number], August 16, 1970) on 05/09/21 at 10:30 AM EST by a video-enabled telemedicine application and verified that I am speaking with the correct person using two identifiers.  Location: Patient: Virtual Visit Location Patient: Home Provider: Virtual Visit Location Provider: Home Office   I discussed the limitations of  evaluation and management by telemedicine and the availability of in person appointments. The patient expressed understanding and agreed to proceed.    History of Present Illness: Crystal Reyes is a 51 y.o. who identifies as a female who was assigned female at birth, and is being seen today for fever, tmax of 100, productive cough and congestion x 4 days.  Denies sick exposure to COVID, flu or strep.  Negative covid test a home.  Has tried OTC medications without relief.  Symptoms are made worse with time.  Reports previous symptoms in the past bronchitis.   Denies fever, chills, fatigue, sinus pain, rhinorrhea, sore throat, SOB, wheezing, chest pain, nausea, changes in bowel or bladder habits.    ROS: As per HPI.  All other pertinent ROS negative.     HPI: HPI  Problems:  Patient Active Problem List   Diagnosis Date Noted   Post-COVID chronic cough 08/02/2020   Overweight (BMI 25.0-29.9) 04/01/2020   COVID-19 vaccine regimen to maintain immunity completed 04/01/2020   Vitamin D deficiency 08/30/2015   Thoracic spondylosis without myelopathy 08/25/2015   Routine general medical examination at a health care facility 04/29/2012   Adult attention deficit disorder    Abnormal Pap smear of cervix     Allergies:  Allergies  Allergen Reactions   Codeine     Headache   Medications:  Current Outpatient Medications:    benzonatate (  TESSALON) 100 MG capsule, Take 1 capsule (100 mg total) by mouth 2 (two) times daily as needed for cough., Disp: 20 capsule, Rfl: 0   predniSONE (STERAPRED UNI-PAK 21 TAB) 10 MG (21) TBPK tablet, Take by mouth daily. Take 6 tabs by mouth daily  for 2 days, then 5 tabs for 2 days, then 4 tabs for 2 days, then 3 tabs for 2 days, 2 tabs for 2 days, then 1 tab by mouth daily for 2 days, Disp: 42 tablet, Rfl: 0   fluconazole (DIFLUCAN) 200 MG tablet, Take 200 mg by mouth once a week., Disp: , Rfl:    methylphenidate (RITALIN) 20 MG tablet, Take 1 tablet (20 mg  total) by mouth 2 (two) times daily. With 5 mg tablet twice daily, Disp: 60 tablet, Rfl: 0   methylphenidate (RITALIN) 5 MG tablet, Take 4 tablets (20 mg total) by mouth 2 (two) times daily., Disp: 240 tablet, Rfl: 0   methylphenidate (RITALIN) 5 MG tablet, Take 4 tablets (20 mg total) by mouth 2 (two) times daily., Disp: 240 tablet, Rfl: 0   methylphenidate (RITALIN) 5 MG tablet, Take 4 tablets (20 mg total) by mouth 2 (two) times daily., Disp: 240 tablet, Rfl: 0   methylphenidate (RITALIN) 5 MG tablet, Take 4 tablets (20 mg total) by mouth 2 (two) times daily., Disp: 240 tablet, Rfl: 0   methylphenidate (RITALIN) 5 MG tablet, Take 1 tablet (5 mg total) by mouth 2 (two) times daily. TAKE WITH 20 MG DOSE TWICE Daily, Disp: 60 tablet, Rfl: 0  Observations/Objective: Patient is well-developed, well-nourished in no acute distress.  Resting comfortably  at home. Appears uncomfortable, but nontoxic Head is normocephalic, atraumatic.  No labored breathing. Speaking in full sentences, and tolerating own secretions.  Cough present.   Speech is clear and coherent with logical content.  Patient is alert and oriented at baseline.   Assessment and Plan: 1. Viral URI with cough   Get plenty of rest and push fluids Tessalon perles prescribed for cough Prednisone prescribed for possible bronchitis Use zyrtec for nasal congestion, runny nose, and/or sore throat Use flonase for nasal congestion and runny nose Use medications daily for symptom relief Use OTC medications like ibuprofen or tylenol as needed fever or pain Follow up with PCP as needed for recheck Follow up, Call or go to the ED if you have any new or worsening symptoms such as fever, worsening cough, shortness of breath, chest tightness, chest pain, turning blue, changes in mental status, etc...    Follow Up Instructions: I discussed the assessment and treatment plan with the patient. The patient was provided an opportunity to ask questions  and all were answered. The patient agreed with the plan and demonstrated an understanding of the instructions.  A copy of instructions were sent to the patient via MyChart unless otherwise noted below.    The patient was advised to call back or seek an in-person evaluation if the symptoms worsen or if the condition fails to improve as anticipated.  Time:  I spent 5-10 minutes with the patient via telehealth technology discussing the above problems/concerns.    Crystal Box, PA-C

## 2021-05-09 NOTE — Patient Instructions (Signed)
Crystal Reyes, thank you for joining Lestine Box, PA-C for today's virtual visit.  While this provider is not your primary care provider (PCP), if your PCP is located in our provider database this encounter information will be shared with them immediately following your visit.  Consent: (Patient) Crystal Reyes provided verbal consent for this virtual visit at the beginning of the encounter.  Current Medications:  Current Outpatient Medications:    benzonatate (TESSALON) 100 MG capsule, Take 1 capsule (100 mg total) by mouth 2 (two) times daily as needed for cough., Disp: 20 capsule, Rfl: 0   predniSONE (STERAPRED UNI-PAK 21 TAB) 10 MG (21) TBPK tablet, Take by mouth daily. Take 6 tabs by mouth daily  for 2 days, then 5 tabs for 2 days, then 4 tabs for 2 days, then 3 tabs for 2 days, 2 tabs for 2 days, then 1 tab by mouth daily for 2 days, Disp: 42 tablet, Rfl: 0   fluconazole (DIFLUCAN) 200 MG tablet, Take 200 mg by mouth once a week., Disp: , Rfl:    methylphenidate (RITALIN) 20 MG tablet, Take 1 tablet (20 mg total) by mouth 2 (two) times daily. With 5 mg tablet twice daily, Disp: 60 tablet, Rfl: 0   methylphenidate (RITALIN) 5 MG tablet, Take 4 tablets (20 mg total) by mouth 2 (two) times daily., Disp: 240 tablet, Rfl: 0   methylphenidate (RITALIN) 5 MG tablet, Take 4 tablets (20 mg total) by mouth 2 (two) times daily., Disp: 240 tablet, Rfl: 0   methylphenidate (RITALIN) 5 MG tablet, Take 4 tablets (20 mg total) by mouth 2 (two) times daily., Disp: 240 tablet, Rfl: 0   methylphenidate (RITALIN) 5 MG tablet, Take 4 tablets (20 mg total) by mouth 2 (two) times daily., Disp: 240 tablet, Rfl: 0   methylphenidate (RITALIN) 5 MG tablet, Take 1 tablet (5 mg total) by mouth 2 (two) times daily. TAKE WITH 20 MG DOSE TWICE Daily, Disp: 60 tablet, Rfl: 0   Medications ordered in this encounter:  Meds ordered this encounter  Medications   predniSONE (STERAPRED UNI-PAK 21  TAB) 10 MG (21) TBPK tablet    Sig: Take by mouth daily. Take 6 tabs by mouth daily  for 2 days, then 5 tabs for 2 days, then 4 tabs for 2 days, then 3 tabs for 2 days, 2 tabs for 2 days, then 1 tab by mouth daily for 2 days    Dispense:  42 tablet    Refill:  0    Order Specific Question:   Supervising Provider    Answer:   Sabra Heck, BRIAN [[id number]]   benzonatate (TESSALON) 100 MG capsule    Sig: Take 1 capsule (100 mg total) by mouth 2 (two) times daily as needed for cough.    Dispense:  20 capsule    Refill:  0    Order Specific Question:   Supervising Provider    Answer:   Sabra Heck, [place]     *If you need refills on other medications prior to your next appointment, please contact your pharmacy*  Follow-Up: Call back or seek an in-person evaluation if the symptoms worsen or if the condition fails to improve as anticipated.  Other Instructions  Get plenty of rest and push fluids Tessalon perles prescribed for cough Prednisone prescribed for possible bronchitis Use zyrtec for nasal congestion, runny nose, and/or sore throat Use flonase for nasal congestion and runny nose Use medications daily for symptom relief Use OTC medications like ibuprofen  or tylenol as needed fever or pain Follow up with PCP as needed for recheck Follow up, Call or go to the ED if you have any new or worsening symptoms such as fever, worsening cough, shortness of breath, chest tightness, chest pain, turning blue, changes in mental status, etc...     If you have been instructed to have an in-person evaluation today at a local Urgent Care facility, please use the link below. It will take you to a list of all of our available [facility], including address, phone number and hours of operation. Please do not delay care.  [facility]  If you or a family member do not have a primary care provider, use the link below to schedule a visit and establish care. When you choose a [facility]  primary care physician or advanced practice provider, you gain a long-term partner in health. Find a Primary Care Provider  Learn more about 's in-office and virtual care options: [facility]

## 2021-05-11 ENCOUNTER — Other Ambulatory Visit: Payer: Self-pay | Admitting: Internal Medicine

## 2021-05-11 DIAGNOSIS — Z1211 Encounter for screening for malignant neoplasm of colon: Secondary | ICD-10-CM

## 2021-05-12 ENCOUNTER — Telehealth: Payer: Self-pay | Admitting: Internal Medicine

## 2021-05-12 DIAGNOSIS — R058 Other specified cough: Secondary | ICD-10-CM

## 2021-05-12 NOTE — Telephone Encounter (Signed)
Pt called in wanting to get an antibiotic for a cough that she is having. Pt was already seen for cough and congestion virtually on Jan.1. she was given prednisone and Tessalon Perles to help cough but she asked about antibiotics too but they said to wait a couple of days and see prednisone will help because it may be viral and to check back later. Pt want to know if provider can prescribe antibiotic

## 2021-05-12 NOTE — Telephone Encounter (Signed)
Spoke with pt and she stated that her symptoms started on Saturday with a sore throat, cough, congestion and a low grade fever. Pt did a virtual visit on 05/09/2021 and was prescribed a prednisone taper and benzonatate. Pt stated that she has now started coughing up phlegm that is tinged with light yellow and green and sore from coughing so much, fever and sore throat is gone. Pt is wanting to know if she needs an antibiotic. No appts available.

## 2021-05-13 NOTE — Telephone Encounter (Signed)
Spoke with pt and informed her of the message below and she stated that she was going to hold off on the xray for right now and see if she starts to feel better.

## 2021-05-18 ENCOUNTER — Telehealth: Payer: Self-pay | Admitting: Internal Medicine

## 2021-05-18 DIAGNOSIS — F902 Attention-deficit hyperactivity disorder, combined type: Secondary | ICD-10-CM

## 2021-05-18 MED ORDER — METHYLPHENIDATE HCL 20 MG PO TABS: 20.0000 mg | ORAL_TABLET | Freq: Two times a day (BID) | ORAL | 0 refills | Status: DC

## 2021-05-18 MED ORDER — METHYLPHENIDATE HCL 5 MG PO TABS: 5.0000 mg | ORAL_TABLET | Freq: Two times a day (BID) | ORAL | 0 refills | Status: DC

## 2021-05-18 NOTE — Telephone Encounter (Signed)
Refilled Ritalin 20 mg: 04/14/2021 Ritalin 5 mg: 04/08/2021  Last OV: 04/08/2021 Next OV: not scheduled

## 2021-05-18 NOTE — Telephone Encounter (Signed)
Pt need refill on methylphenidate for the other two months sent to [facility] [address] in [place]

## 2021-05-19 ENCOUNTER — Encounter: Payer: Self-pay | Admitting: Internal Medicine

## 2021-05-22 DIAGNOSIS — J069 Acute upper respiratory infection, unspecified: Secondary | ICD-10-CM | POA: Diagnosis not present

## 2021-06-07 ENCOUNTER — Ambulatory Visit (AMBULATORY_SURGERY_CENTER): Payer: BC Managed Care – PPO | Admitting: *Deleted

## 2021-06-07 ENCOUNTER — Other Ambulatory Visit: Payer: Self-pay

## 2021-06-07 VITALS — Ht 64.0 in | Wt 144.0 lb

## 2021-06-07 DIAGNOSIS — Z8371 Family history of colonic polyps: Secondary | ICD-10-CM

## 2021-06-07 DIAGNOSIS — Z1211 Encounter for screening for malignant neoplasm of colon: Secondary | ICD-10-CM

## 2021-06-07 MED ORDER — NA SULFATE-K SULFATE-MG SULF 17.5-3.13-1.6 GM/177ML PO SOLN: 1.0000 | ORAL | 0 refills | Status: DC

## 2021-06-07 NOTE — Progress Notes (Signed)

## 2021-06-17 ENCOUNTER — Encounter: Payer: Self-pay | Admitting: Internal Medicine

## 2021-06-21 ENCOUNTER — Encounter: Payer: BC Managed Care – PPO | Admitting: Internal Medicine

## 2021-06-21 ENCOUNTER — Encounter: Payer: Self-pay | Admitting: Internal Medicine

## 2021-06-21 ENCOUNTER — Other Ambulatory Visit: Payer: Self-pay

## 2021-06-21 ENCOUNTER — Ambulatory Visit (AMBULATORY_SURGERY_CENTER): Payer: BC Managed Care – PPO | Admitting: Internal Medicine

## 2021-06-21 VITALS — BP 134/82 | HR 71 | Temp 98.6°F | Resp 11 | Ht 64.0 in | Wt 144.0 lb

## 2021-06-21 DIAGNOSIS — Z1211 Encounter for screening for malignant neoplasm of colon: Secondary | ICD-10-CM

## 2021-06-21 MED ORDER — SODIUM CHLORIDE 0.9 % IV SOLN: 500.0000 mL | Freq: Once | INTRAVENOUS | Status: DC

## 2021-06-21 NOTE — Progress Notes (Signed)
Pt's states no medical or surgical changes since previsit or office visit.    Vs by CW

## 2021-06-21 NOTE — Patient Instructions (Signed)
Handouts provided on diverticulosis and hemorrhoids.   YOU HAD AN ENDOSCOPIC PROCEDURE TODAY AT THE [facility]:   Refer to the procedure report that was given to you for any specific questions about what was found during the examination.  If the procedure report does not answer your questions, please call your gastroenterologist to clarify.  If you requested that your care partner not be given the details of your procedure findings, then the procedure report has been included in a sealed envelope for you to review at your convenience later.  YOU SHOULD EXPECT: Some feelings of bloating in the abdomen. Passage of more gas than usual.  Walking can help get rid of the air that was put into your GI tract during the procedure and reduce the bloating. If you had a lower endoscopy (such as a colonoscopy or flexible sigmoidoscopy) you may notice spotting of blood in your stool or on the toilet paper. If you underwent a bowel prep for your procedure, you may not have a normal bowel movement for a few days.  Please Note:  You might notice some irritation and congestion in your nose or some drainage.  This is from the oxygen used during your procedure.  There is no need for concern and it should clear up in a day or so.  SYMPTOMS TO REPORT IMMEDIATELY:   Following lower endoscopy (colonoscopy or flexible sigmoidoscopy):  Excessive amounts of blood in the stool  Significant tenderness or worsening of abdominal pains  Swelling of the abdomen that is new, acute  Fever of 100F or higher   For urgent or emergent issues, a gastroenterologist can be reached at any hour by calling [phone]. Do not use MyChart messaging for urgent concerns.    DIET:  We do recommend a small meal at first, but then you may proceed to your regular diet.  Drink plenty of fluids but you should avoid alcoholic beverages for 24 hours.  ACTIVITY:  You should plan to take it easy for the rest of today and you should  NOT DRIVE or use heavy machinery until tomorrow (because of the sedation medicines used during the test).    FOLLOW UP: Our staff will call the number listed on your records 48-72 hours following your procedure to check on you and address any questions or concerns that you may have regarding the information given to you following your procedure. If we do not reach you, we will leave a message.  We will attempt to reach you two times.  During this call, we will ask if you have developed any symptoms of COVID 19. If you develop any symptoms (ie: fever, flu-like symptoms, shortness of breath, cough etc.) before then, please call [phone].  If you test positive for Covid 19 in the 2 weeks post procedure, please call and report this information to us.    If any biopsies were taken you will be contacted by phone or by letter within the next 1-3 weeks.  Please call us at [phone] if you have not heard about the biopsies in 3 weeks.    SIGNATURES/CONFIDENTIALITY: You and/or your care partner have signed paperwork which will be entered into your electronic medical record.  These signatures attest to the fact that that the information above on your After Visit Summary has been reviewed and is understood.  Full responsibility of the confidentiality of this discharge information lies with you and/or your care-partner.  

## 2021-06-21 NOTE — Progress Notes (Signed)
Report given to PACU, vss 

## 2021-06-21 NOTE — Progress Notes (Signed)
GASTROENTEROLOGY PROCEDURE H&P NOTE   Primary Care Physician: Crecencio Mc, MD    Reason for Procedure:  Colorectal cancer screening  Plan:    Colonoscopy  Patient is appropriate for endoscopic procedure(s) in the ambulatory ([facility]) setting.  The nature of the procedure, as well as the risks, benefits, and alternatives were carefully and thoroughly reviewed with the patient. Ample time for discussion and questions allowed. The patient understood, was satisfied, and agreed to proceed.     HPI: Crystal Reyes is a 51 y.o. female who presents for colonoscopy.  Medical history as below.  Tolerated the prep.  No recent chest pain or shortness of breath.  No abdominal pain today.  Past Medical History:  Diagnosis Date   Abnormal Pap smear of cervix 2010   normal biopsy, followup with GYN   Attention deficit disorder of adult without mention of hyperactivity    managed with methylphenidate   Basal cell carcinoma    Chronic back pain    COVID-19 virus infection 07/09/2020   Headache     Past Surgical History:  Procedure Laterality Date   BUNIONECTOMY     COLPOSCOPY      Prior to Admission medications   Medication Sig Start Date End Date Taking? Authorizing Provider  methylphenidate (RITALIN) 20 MG tablet Take 1 tablet (20 mg total) by mouth 2 (two) times daily. With 5 mg tablet twice daily 05/18/21  Yes Crecencio Mc, MD  methylphenidate (RITALIN) 5 MG tablet Take 1 tablet (5 mg total) by mouth 2 (two) times daily. TAKE WITH 20 MG DOSE TWICE Daily 05/18/21  Yes Crecencio Mc, MD  Multiple Vitamin (MULTIVITAMIN) tablet Take 1 tablet by mouth daily.   Yes [provider]    Current Outpatient Medications  Medication Sig Dispense Refill   methylphenidate (RITALIN) 20 MG tablet Take 1 tablet (20 mg total) by mouth 2 (two) times daily. With 5 mg tablet twice daily 60 tablet 0   methylphenidate (RITALIN) 5 MG tablet Take 1 tablet (5 mg total) by mouth 2  (two) times daily. TAKE WITH 20 MG DOSE TWICE Daily 60 tablet 0   Multiple Vitamin (MULTIVITAMIN) tablet Take 1 tablet by mouth daily.     Current Facility-Administered Medications  Medication Dose Route Frequency Provider Last Rate Last Admin   0.9 %  sodium chloride infusion  500 mL Intravenous Once Isair Inabinet, Lajuan Lines, MD        Allergies as of 06/21/2021 - Review Complete 06/21/2021  Allergen Reaction Noted   Codeine  02/09/2011    Family History  Problem Relation Age of Onset   Hypertension Mother    Hyperlipidemia Mother    Colon polyps Father 5   Hyperlipidemia Father    Hypertension Father    Colon polyps Sister 71   Heart disease Maternal Grandfather    Cancer Paternal Grandmother 8       melanoma   Colon cancer Paternal Grandfather    Breast cancer Cousin        2nd cousin    Social History   Socioeconomic History   Marital status: Married    Spouse name: Not on file   Number of children: Not on file   Years of education: Not on file   Highest education level: Not on file  Occupational History   Occupation: attorney    Employer: [employer]  Tobacco Use   Smoking status: Never   Smokeless tobacco: Never  Vaping Use  Vaping Use: Never used  Substance and Sexual Activity   Alcohol use: Yes    Comment: wine occasional   Drug use: No   Sexual activity: Yes    Birth control/protection: None  Other Topics Concern   Not on file  Social History Narrative   Not on file   Social Determinants of Health   Financial Resource Strain: Not on file  Food Insecurity: Not on file  Transportation Needs: Not on file  Physical Activity: Not on file  Stress: Not on file  Social Connections: Not on file  Intimate Partner Violence: Not on file    Physical Exam: Vital signs in last 24 hours: @BP  (!) 156/94    Pulse 86    Temp 98.6 F (37 C)    Resp 13    Ht 5\' 4"  (1.626 m)    Wt 144 lb (65.3 kg)    SpO2 100%    BMI 24.72 kg/m  GEN: NAD EYE: Sclerae  anicteric ENT: MMM CV: Non-tachycardic Pulm: CTA b/l GI: Soft, NT/ND NEURO:  Alert & Oriented x 3   Zenovia Jarred, MD [facility]  06/21/2021 1:54 PM

## 2021-06-21 NOTE — Op Note (Signed)
[facility] Patient Name: Crystal Reyes Procedure Date: 06/21/2021 1:50 PM MRN: [id number] Endoscopist: Jerene Bears , MD Age: 51 Referring MD:  Date of Birth: 02-01-1971 Gender: Female Account #: [id number] Procedure:                Colonoscopy Indications:              Screening for colorectal malignant neoplasm, This                            is the patient's first colonoscopy Medicines:                Monitored Anesthesia Care Procedure:                Pre-Anesthesia Assessment:                           - Prior to the procedure, a History and Physical                            was performed, and patient medications and                            allergies were reviewed. The patient's tolerance of                            previous anesthesia was also reviewed. The risks                            and benefits of the procedure and the sedation                            options and risks were discussed with the patient.                            All questions were answered, and informed consent                            was obtained. Prior Anticoagulants: The patient has                            taken no previous anticoagulant or antiplatelet                            agents. ASA Grade Assessment: II - A patient with                            mild systemic disease. After reviewing the risks                            and benefits, the patient was deemed in                            satisfactory condition to undergo the procedure.  After obtaining informed consent, the colonoscope                            was passed under direct vision. Throughout the                            procedure, the patient's blood pressure, pulse, and                            oxygen saturations were monitored continuously. The                            PCF-HQ190L Colonoscope was introduced through the                            anus and  advanced to the cecum, identified by                            appendiceal orifice and ileocecal valve. The                            colonoscopy was performed without difficulty. The                            patient tolerated the procedure well. The quality                            of the bowel preparation was good. The ileocecal                            valve, appendiceal orifice, and rectum were                            photographed. Scope In: 1:59:34 PM Scope Out: 2:16:10 PM Scope Withdrawal Time: 0 hours 11 minutes 35 seconds  Total Procedure Duration: 0 hours 16 minutes 36 seconds  Findings:                 The digital rectal exam was normal.                           Multiple small and large-mouthed diverticula were                            found in the sigmoid colon, descending colon,                            transverse colon and ascending colon.                           Internal hemorrhoids were found during                            retroflexion. The hemorrhoids were small.  The exam was otherwise without abnormality. Complications:            No immediate complications. Estimated Blood Loss:     Estimated blood loss: none. Impression:               - Diverticulosis in the sigmoid colon, in the                            descending colon, in the transverse colon and in                            the ascending colon.                           - Small internal hemorrhoids.                           - The examination was otherwise normal.                           - No specimens collected. Recommendation:           - Patient has a contact number available for                            emergencies. The signs and symptoms of potential                            delayed complications were discussed with the                            patient. Return to normal activities tomorrow.                            Written discharge instructions were  provided to the                            patient.                           - Resume previous diet.                           - Continue present medications.                           - Repeat colonoscopy in 10 years for screening                            purposes. Jerene Bears, MD 06/21/2021 2:18:52 PM This report has been signed electronically.

## 2021-06-23 ENCOUNTER — Telehealth: Payer: Self-pay

## 2021-06-23 NOTE — Telephone Encounter (Signed)
°  Follow up Call-  Call back number 06/21/2021  Post procedure Call Back phone  # [phone]  Permission to leave phone message Yes  Some recent data might be hidden     Patient questions:  Do you have a fever, pain , or abdominal swelling? No. Pain Score  0 *  Have you tolerated food without any problems? Yes.    Have you been able to return to your normal activities? Yes.    Do you have any questions about your discharge instructions: Diet   No. Medications  No. Follow up visit  No.  Do you have questions or concerns about your Care? No.  Actions: * If pain score is 4 or above: No action needed, pain <4.  Have you developed a fever since your procedure? no  2.   Have you had an respiratory symptoms (SOB or cough) since your procedure? no  3.   Have you tested positive for COVID 19 since your procedure no  4.   Have you had any family members/close contacts diagnosed with the COVID 19 since your procedure?  no   If yes to any of these questions please route to Joylene John, RN and Joella Prince, RN

## 2021-06-23 NOTE — Telephone Encounter (Signed)
°  Follow up Call-  Call back number 06/21/2021  Post procedure Call Back phone  # [phone]  Permission to leave phone message Yes  Some recent data might be hidden    1st follow up call made.  NALM

## 2021-06-25 ENCOUNTER — Ambulatory Visit
Admission: RE | Admit: 2021-06-25 | Discharge: 2021-06-25 | Disposition: A | Discharge status: Home / Self Care | Payer: BC Managed Care – PPO | Source: Ambulatory Visit | Attending: Internal Medicine | Admitting: Internal Medicine

## 2021-06-25 ENCOUNTER — Other Ambulatory Visit: Payer: Self-pay

## 2021-06-25 DIAGNOSIS — Z1231 Encounter for screening mammogram for malignant neoplasm of breast: Secondary | ICD-10-CM | POA: Insufficient documentation

## 2021-07-16 DIAGNOSIS — D2271 Melanocytic nevi of right lower limb, including hip: Secondary | ICD-10-CM | POA: Diagnosis not present

## 2021-07-16 DIAGNOSIS — D2261 Melanocytic nevi of right upper limb, including shoulder: Secondary | ICD-10-CM | POA: Diagnosis not present

## 2021-07-16 DIAGNOSIS — Z85828 Personal history of other malignant neoplasm of skin: Secondary | ICD-10-CM | POA: Diagnosis not present

## 2021-07-16 DIAGNOSIS — L814 Other melanin hyperpigmentation: Secondary | ICD-10-CM | POA: Diagnosis not present

## 2021-07-19 ENCOUNTER — Encounter: Payer: Self-pay | Admitting: Internal Medicine

## 2021-07-19 DIAGNOSIS — F902 Attention-deficit hyperactivity disorder, combined type: Secondary | ICD-10-CM

## 2021-07-19 MED ORDER — METHYLPHENIDATE HCL 20 MG PO TABS: 20.0000 mg | ORAL_TABLET | Freq: Two times a day (BID) | ORAL | 0 refills | Status: DC

## 2021-07-19 MED ORDER — METHYLPHENIDATE HCL 5 MG PO TABS: 5.0000 mg | ORAL_TABLET | Freq: Two times a day (BID) | ORAL | 0 refills | Status: DC

## 2021-07-19 NOTE — Telephone Encounter (Signed)
Both doses refilled on 05/18/2021 ?Last OV: 04/08/2021 ?Next OV: not scheduled ?

## 2021-09-05 ENCOUNTER — Other Ambulatory Visit: Payer: Self-pay | Admitting: Internal Medicine

## 2021-09-05 DIAGNOSIS — F902 Attention-deficit hyperactivity disorder, combined type: Secondary | ICD-10-CM

## 2021-09-06 MED ORDER — METHYLPHENIDATE HCL 20 MG PO TABS: 20.0000 mg | ORAL_TABLET | Freq: Two times a day (BID) | ORAL | 0 refills | Status: DC

## 2021-09-06 MED ORDER — METHYLPHENIDATE HCL 5 MG PO TABS: 5.0000 mg | ORAL_TABLET | Freq: Two times a day (BID) | ORAL | 0 refills | Status: DC

## 2021-09-23 ENCOUNTER — Ambulatory Visit: Payer: BC Managed Care – PPO | Admitting: Internal Medicine

## 2021-09-23 ENCOUNTER — Encounter: Payer: Self-pay | Admitting: Internal Medicine

## 2021-09-23 VITALS — BP 98/70 | HR 76 | Temp 98.3°F | Ht 64.0 in | Wt 147.4 lb

## 2021-09-23 DIAGNOSIS — Z79899 Other long term (current) drug therapy: Secondary | ICD-10-CM

## 2021-09-23 DIAGNOSIS — R87619 Unspecified abnormal cytological findings in specimens from cervix uteri: Secondary | ICD-10-CM

## 2021-09-23 DIAGNOSIS — E785 Hyperlipidemia, unspecified: Secondary | ICD-10-CM | POA: Diagnosis not present

## 2021-09-23 DIAGNOSIS — R635 Abnormal weight gain: Secondary | ICD-10-CM

## 2021-09-23 DIAGNOSIS — F902 Attention-deficit hyperactivity disorder, combined type: Secondary | ICD-10-CM

## 2021-09-23 DIAGNOSIS — F988 Other specified behavioral and emotional disorders with onset usually occurring in childhood and adolescence: Secondary | ICD-10-CM

## 2021-09-23 DIAGNOSIS — S0006XA Insect bite (nonvenomous) of scalp, initial encounter: Secondary | ICD-10-CM

## 2021-09-23 DIAGNOSIS — E663 Overweight: Secondary | ICD-10-CM

## 2021-09-23 DIAGNOSIS — R7301 Impaired fasting glucose: Secondary | ICD-10-CM

## 2021-09-23 DIAGNOSIS — W57XXXA Bitten or stung by nonvenomous insect and other nonvenomous arthropods, initial encounter: Secondary | ICD-10-CM

## 2021-09-23 LAB — COMPREHENSIVE METABOLIC PANEL
ALT: 8 U/L (ref 0–35)
AST: 16 U/L (ref 0–37)
Albumin: 4.3 g/dL (ref 3.5–5.2)
Alkaline Phosphatase: 59 U/L (ref 39–117)
BUN: 10 mg/dL (ref 6–23)
CO2: 28 mEq/L (ref 19–32)
Calcium: 8.8 mg/dL (ref 8.4–10.5)
Chloride: 103 mEq/L (ref 96–112)
Creatinine, Ser: 0.75 mg/dL (ref 0.40–1.20)
GFR: 93.58 mL/min (ref >60.00)
Glucose, Bld: 93 mg/dL (ref 70–99)
Potassium: 4 mEq/L (ref 3.5–5.1)
Sodium: 138 mEq/L (ref 135–145)
Total Bilirubin: 0.6 mg/dL (ref 0.2–1.2)
Total Protein: 6.7 g/dL (ref 6.0–8.3)

## 2021-09-23 LAB — LIPID PANEL
Cholesterol: 244 mg/dL — ABNORMAL HIGH (ref 0–200)
HDL: 65.2 mg/dL (ref >39.00)
LDL Cholesterol: 163 mg/dL — ABNORMAL HIGH (ref 0–99)
NonHDL: 178.48
Total CHOL/HDL Ratio: 4
Triglycerides: 78 mg/dL (ref 0.0–149.0)
VLDL: 15.6 mg/dL (ref 0.0–40.0)

## 2021-09-23 LAB — TSH: TSH: 2.56 u[IU]/mL (ref 0.35–5.50)

## 2021-09-23 LAB — LDL CHOLESTEROL, DIRECT: Direct LDL: 163 mg/dL

## 2021-09-23 MED ORDER — METHYLPHENIDATE HCL 20 MG PO TABS: 20.0000 mg | ORAL_TABLET | Freq: Two times a day (BID) | ORAL | 0 refills | Status: DC

## 2021-09-23 MED ORDER — METHYLPHENIDATE HCL 5 MG PO TABS: 5.0000 mg | ORAL_TABLET | Freq: Two times a day (BID) | ORAL | 0 refills | Status: DC

## 2021-09-23 MED ORDER — DOXYCYCLINE HYCLATE 100 MG PO TABS: 100.0000 mg | ORAL_TABLET | Freq: Two times a day (BID) | ORAL | 2 refills | Status: DC

## 2021-09-23 NOTE — Assessment & Plan Note (Signed)
Occurred over ten years ago.  Routine surveillance is done by her GYN and she is due now

## 2021-09-23 NOTE — Progress Notes (Signed)
Subjective:  Patient ID: Crystal Reyes, female    DOB: 1970/11/29  Age: 51 y.o. MRN: [id number]  CC: The primary encounter diagnosis was Long-term use of high-risk medication. Diagnoses of Impaired fasting glucose, Hyperlipidemia, unspecified hyperlipidemia type, Weight gain, Attention deficit hyperactivity disorder (ADHD), combined type, Adult attention deficit disorder, Abnormal cervical Papanicolaou smear, unspecified abnormal pap finding, Overweight (BMI 25.0-29.9), and Tick bite of scalp, initial encounter were also pertinent to this visit.   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  Responsibilities have increased and she is now  a Interior and spatial designer.     2) Overweight: somewhat frustrated  at her inabilty to maintain the nadir of 135 taht she reached transieint,  notes that it took supreme effort.  Currenlty not exercising due  to work schedule and travel with family    3) Tick exposure: found one on her face this morning, in her hairline.  It was engorged.  Has not had any No headache or fevers.    Outpatient Medications Prior to Visit  Medication Sig Dispense Refill   fluconazole (DIFLUCAN) 200 MG tablet Take 200 mg by mouth once a week.     Multiple Vitamin (MULTIVITAMIN) tablet Take 1 tablet by mouth daily.     methylphenidate (RITALIN) 20 MG tablet Take 1 tablet (20 mg total) by mouth 2 (two) times daily. With 5 mg tablet twice daily 60 tablet 0   methylphenidate (RITALIN) 5 MG tablet Take 1 tablet (5 mg total) by mouth 2 (two) times daily. TAKE WITH 20 MG DOSE TWICE Daily 60 tablet 0   No facility-administered medications prior to visit.    Review of Systems;  Patient denies headache, fevers, malaise, unintentional weight loss, skin rash, eye pain, sinus congestion and sinus pain, sore throat, dysphagia,  hemoptysis  , cough, dyspnea, wheezing, chest pain, palpitations, orthopnea, edema, abdominal pain, nausea, melena, diarrhea, constipation, flank pain, dysuria, hematuria, urinary  Frequency, nocturia, numbness, tingling, seizures,  Focal weakness, Loss of consciousness,  Tremor, insomnia, depression, anxiety, and suicidal ideation.      Objective:  BP 98/70 (BP Location: Left Arm, Patient Position: Sitting, Cuff Size: Normal)   Pulse 76   Temp 98.3 F (36.8 C) (Oral)   Ht 5\' 4"  (1.626 m)   Wt 147 lb 6.4 oz (66.9 kg)   SpO2 98%   BMI 25.30 kg/m   BP Readings from Last 3 Encounters:  09/23/21 98/70  06/21/21 134/82  07/16/18 136/84    Wt Readings from Last 3 Encounters:  09/23/21 147 lb 6.4 oz (66.9 kg)  06/21/21 144 lb (65.3 kg)  06/07/21 144 lb (65.3 kg)    General appearance: alert, cooperative and appears stated age Ears: normal TM's and external ear canals both ears Throat: lips, mucosa, and tongue normal; teeth and gums normal Neck: no adenopathy, no carotid bruit, supple, symmetrical, trachea midline and thyroid not enlarged, symmetric, no tenderness/mass/nodules Back: symmetric, no curvature. ROM normal. No CVA tenderness. Lungs: clear to auscultation bilaterally Heart: regular rate and rhythm, S1, S2 normal, no murmur, click, rub or gallop Abdomen: soft, non-tender; bowel sounds normal; no masses,  no organomegaly Pulses: 2+ and symmetric Skin: Skin color, texture, turgor normal. No rashes or lesions Lymph nodes: Cervical, supraclavicular, and axillary nodes normal.  No results found for: HGBA1C  Lab Results  Component Value Date   CREATININE 0.75 09/23/2021   CREATININE 0.84 04/07/2020   CREATININE 0.75 07/13/2018    Lab Results  Component Value Date   WBC 6.0 04/07/2020   HGB 13.6 04/07/2020   HCT 40.9 04/07/2020   PLT 272.0 04/07/2020   GLUCOSE 93 09/23/2021   CHOL 244 (H) 09/23/2021   TRIG 78.0 09/23/2021   HDL 65.20 09/23/2021   LDLDIRECT 163.0 09/23/2021    LDLCALC 163 (H) 09/23/2021   ALT 8 09/23/2021   AST 16 09/23/2021   NA 138 09/23/2021   K 4.0 09/23/2021   CL 103 09/23/2021   CREATININE 0.75 09/23/2021   BUN 10 09/23/2021   CO2 28 09/23/2021   TSH 2.56 09/23/2021    MM 3D SCREEN BREAST BILATERAL  Result Date: 06/25/2021 CLINICAL DATA:  Screening. EXAM: DIGITAL SCREENING BILATERAL MAMMOGRAM WITH TOMOSYNTHESIS AND CAD TECHNIQUE: Bilateral screening digital craniocaudal and mediolateral oblique mammograms were obtained. Bilateral screening digital breast tomosynthesis was performed. The images were evaluated with computer-aided detection. COMPARISON:  Previous exam(s). ACR Breast Density Category b: There are scattered areas of fibroglandular density. FINDINGS: There are no findings suspicious for malignancy. IMPRESSION: No mammographic evidence of malignancy. A result letter of this screening mammogram will be mailed directly to the patient. RECOMMENDATION: Screening mammogram in one year. (Code:SM-B-01Y) BI-RADS CATEGORY  1: Negative. Electronically Signed   By: Marin Olp M.D.   On: 06/25/2021 11:30   Assessment & Plan:   Problem List Items Addressed This Visit     Abnormal Pap smear of cervix    Occurred over ten years ago.  Routine surveillance is done by her GYN and she is due now       Adult attention deficit disorder    Patient is functioning  well  without side effects on current dose of  20 mg bid however the pharmacy is frequently "out of supply" when she requests a refill .        Overweight (BMI 25.0-29.9)    She has been able to reduce weight to 135 with diligence and exercise but gained some back due to schedule and lack of regular exercise .  Screening labs are normal        Tick bite of scalp    No signs of infection.  reviewed hte [facility] Spotted Fever  can present with fever and a headache, NOT a rash;  the occurrence of these symptoms during spring/summer/fall in the context of recent tick exposure  is RMSF until proven otherwise and should be treated immediately with doxycycline.  I have sent this rx to her pharmacy to use if she were to  develop these symptoms.         Other Visit Diagnoses     Long-term use of high-risk medication    -  Primary   Relevant Orders   Comp Met (CMET) (Completed)   Impaired fasting glucose       Relevant Orders   Comp Met (CMET) (Completed)   Hyperlipidemia, unspecified hyperlipidemia type       Relevant Orders   Lipid Profile (Completed)   Direct LDL (Completed)   Weight gain       Relevant Orders   TSH (Completed)   Attention deficit hyperactivity disorder (ADHD), combined type       Relevant Medications   methylphenidate (RITALIN) 5 MG tablet   methylphenidate (RITALIN) 5 MG tablet      Follow-up: No follow-ups on file.   Crecencio Mc,  MD 

## 2021-09-23 NOTE — Assessment & Plan Note (Signed)
Patient is functioning  well  without side effects on current dose of  20 mg bid however the pharmacy is frequently "out of supply" when she requests a refill .

## 2021-09-23 NOTE — Assessment & Plan Note (Addendum)
She has been able to reduce weight to 135 with diligence and exercise but gained some back due to schedule and lack of regular exercise .  Screening labs are normal

## 2021-09-25 DIAGNOSIS — S0006XA Insect bite (nonvenomous) of scalp, initial encounter: Secondary | ICD-10-CM | POA: Insufficient documentation

## 2021-09-25 NOTE — Assessment & Plan Note (Signed)
No signs of infection.  reviewed hte [facility] Spotted Fever  can present with fever and a headache, NOT a rash;  the occurrence of these symptoms during spring/summer/fall in the context of recent tick exposure is RMSF until proven otherwise and should be treated immediately with doxycycline.  I have sent this rx to her pharmacy to use if she were to  develop these symptoms.

## 2021-11-20 ENCOUNTER — Other Ambulatory Visit: Payer: Self-pay | Admitting: Internal Medicine

## 2021-11-20 DIAGNOSIS — F902 Attention-deficit hyperactivity disorder, combined type: Secondary | ICD-10-CM

## 2021-11-22 MED ORDER — METHYLPHENIDATE HCL 20 MG PO TABS: 20.0000 mg | ORAL_TABLET | Freq: Two times a day (BID) | ORAL | 0 refills | Status: DC

## 2021-11-22 MED ORDER — METHYLPHENIDATE HCL 5 MG PO TABS: 5.0000 mg | ORAL_TABLET | Freq: Two times a day (BID) | ORAL | 0 refills | Status: DC

## 2021-11-22 MED ORDER — METHYLPHENIDATE HCL 20 MG PO TABS
20.0000 mg | ORAL_TABLET | Freq: Two times a day (BID) | ORAL | 0 refills | Status: DC
Start: 2021-11-22 — End: 2022-02-09

## 2021-11-22 NOTE — Telephone Encounter (Signed)
Refilled 20 MG 09/23/21    '5MG'$  09/23/21 Last OV 09/23/21 Next OV Not scheduled

## 2022-01-24 DIAGNOSIS — D2272 Melanocytic nevi of left lower limb, including hip: Secondary | ICD-10-CM | POA: Diagnosis not present

## 2022-01-24 DIAGNOSIS — D2261 Melanocytic nevi of right upper limb, including shoulder: Secondary | ICD-10-CM | POA: Diagnosis not present

## 2022-01-24 DIAGNOSIS — L570 Actinic keratosis: Secondary | ICD-10-CM | POA: Diagnosis not present

## 2022-01-24 DIAGNOSIS — Z85828 Personal history of other malignant neoplasm of skin: Secondary | ICD-10-CM | POA: Diagnosis not present

## 2022-01-24 DIAGNOSIS — D225 Melanocytic nevi of trunk: Secondary | ICD-10-CM | POA: Diagnosis not present

## 2022-01-31 ENCOUNTER — Ambulatory Visit (INDEPENDENT_AMBULATORY_CARE_PROVIDER_SITE_OTHER): Payer: BC Managed Care – PPO

## 2022-01-31 ENCOUNTER — Ambulatory Visit: Payer: BC Managed Care – PPO | Admitting: Family

## 2022-01-31 ENCOUNTER — Encounter: Payer: Self-pay | Admitting: Family

## 2022-01-31 VITALS — BP 114/72 | HR 101 | Temp 98.5°F | Ht 64.0 in | Wt 146.0 lb

## 2022-01-31 DIAGNOSIS — M545 Low back pain, unspecified: Secondary | ICD-10-CM | POA: Diagnosis not present

## 2022-01-31 DIAGNOSIS — M546 Pain in thoracic spine: Secondary | ICD-10-CM | POA: Diagnosis not present

## 2022-01-31 DIAGNOSIS — G8929 Other chronic pain: Secondary | ICD-10-CM | POA: Diagnosis not present

## 2022-01-31 DIAGNOSIS — M5441 Lumbago with sciatica, right side: Secondary | ICD-10-CM

## 2022-01-31 MED ORDER — MELOXICAM 7.5 MG PO TABS: 7.5000 mg | ORAL_TABLET | Freq: Every day | ORAL | 1 refills | Status: DC | PRN

## 2022-01-31 MED ORDER — PREDNISONE 10 MG PO TABS: ORAL_TABLET | ORAL | 0 refills | Status: DC

## 2022-01-31 NOTE — Patient Instructions (Signed)
Start prednisone as discussed tomorrow.  Please make sure you get all doses in by 1 PM each day as prednisone can interfere with sleep. I have also sent in a prescription anti-inflammatory, mobic. You do not need to take ibuprofen. Please ice or apply heat to your low back for 20 minutes twice daily.  I placed a referral to physical therapy. Let us know if you dont hear back within a week in regards to an appointment being scheduled.

## 2022-01-31 NOTE — Assessment & Plan Note (Addendum)
Acute.  Chronic low back pain however acutely right leg pain is more bothersome for patient today.  Pain radiating to right thigh.  Pending thoracic and lumbar x-ray.  Start prednisone.  Counseled on side effects of prednisone and how to take medication.  discussed pursuing MRI due to radicular symptoms most especially if pain does not respond to prednisone. We agreed conservative therapy at this time.  I have also provided meloxicam and placed a referral to physical therapy.  Counseled on the importance of heat /ice regimen.  She will let me know how she is doing

## 2022-01-31 NOTE — Progress Notes (Signed)
Subjective:    Patient ID: Crystal Reyes, female    DOB: 1970-06-27, 51 y.o.   MRN: [id number]  CC: [name] is a 51 y.o. female who presents today for an acute visit.    HPI: CC: Low back pain x 3 days. She notes intermittent low back pain more noticeable over the past couple of months.   She was pulling a pot in the kitchen and subsequently has right leg pain, shooting pain down right-medial  posterior thigh to knee. Pain comes and goes.  She states that it is not numbness. Worse with standing and walking. Low back is 'sore'.  Right  leg pain improves when laying supine.   No trouble urinating,  groin pain, saddle anesthesia, bowel incontinence.   Tried muscle relaxant , ibuprofen without relief.   Previous similar episode years ago after throwing snowballs.    Previously following chiropracter.   H/o BCC No h/o ckd Never smoker HISTORY:  Past Medical History:  Diagnosis Date   Abnormal Pap smear of cervix 2010   normal biopsy, followup with GYN   Attention deficit disorder of adult without mention of hyperactivity    managed with methylphenidate   Basal cell carcinoma    Chronic back pain    COVID-19 virus infection 07/09/2020   Headache    Past Surgical History:  Procedure Laterality Date   BUNIONECTOMY     COLPOSCOPY     Family History  Problem Relation Age of Onset   Hypertension Mother    Hyperlipidemia Mother    Colon polyps Father 74   Hyperlipidemia Father    Hypertension Father    Colon polyps Sister 78   Heart disease Maternal Grandfather    Cancer Paternal Grandmother 19       melanoma   Colon cancer Paternal Grandfather    Breast cancer Cousin        2nd cousin    Allergies: Codeine Current Outpatient Medications on File Prior to Visit  Medication Sig Dispense Refill   fluconazole (DIFLUCAN) 200 MG tablet Take 200 mg by mouth once a week.     methylphenidate (RITALIN) 20 MG tablet Take 1 tablet (20 mg total) by  mouth 2 (two) times daily. With 5 mg tablet twice daily 60 tablet 0   methylphenidate (RITALIN) 20 MG tablet Take 1 tablet (20 mg total) by mouth 2 (two) times daily. With 5 mg tablet twice daily 60 tablet 0   methylphenidate (RITALIN) 20 MG tablet Take 1 tablet (20 mg total) by mouth 2 (two) times daily. With 5 mg tablet twice daily 60 tablet 0   methylphenidate (RITALIN) 20 MG tablet Take 1 tablet (20 mg total) by mouth 2 (two) times daily. With 5 mg tablet twice daily 60 tablet 0   methylphenidate (RITALIN) 5 MG tablet Take 1 tablet (5 mg total) by mouth 2 (two) times daily. TAKE WITH 20 MG DOSE TWICE Daily 60 tablet 0   methylphenidate (RITALIN) 5 MG tablet Take 1 tablet (5 mg total) by mouth 2 (two) times daily. TAKE WITH 20 MG DOSE TWICE Daily 60 tablet 0   methylphenidate (RITALIN) 5 MG tablet Take 1 tablet (5 mg total) by mouth 2 (two) times daily. TAKE WITH 20 MG DOSE TWICE Daily 60 tablet 0   methylphenidate (RITALIN) 5 MG tablet Take 1 tablet (5 mg total) by mouth 2 (two) times daily. TAKE WITH 20 MG DOSE TWICE Daily 60 tablet 0   Multiple Vitamin (MULTIVITAMIN) tablet Take  1 tablet by mouth daily.     No current facility-administered medications on file prior to visit.    Social History   Tobacco Use   Smoking status: Never   Smokeless tobacco: Never  Vaping Use   Vaping Use: Never used  Substance Use Topics   Alcohol use: Yes    Comment: wine occasional   Drug use: No    Review of Systems  Constitutional:  Negative for chills and fever.  Respiratory:  Negative for cough.   Cardiovascular:  Negative for chest pain and palpitations.  Gastrointestinal:  Negative for nausea and vomiting.  Musculoskeletal:  Positive for back pain.  Neurological:  Negative for numbness.      Objective:    BP 114/72 (BP Location: Left Arm, Patient Position: Sitting, Cuff Size: Normal)   Pulse (!) 101   Temp 98.5 F (36.9 C) (Oral)   Ht '5\' 4"'$  (1.626 m)   Wt 146 lb (66.2 kg)   SpO2 99%    BMI 25.06 kg/m    Physical Exam Vitals reviewed.  Constitutional:      Appearance: She is well-developed.  Eyes:     Conjunctiva/sclera: Conjunctivae normal.  Cardiovascular:     Rate and Rhythm: Normal rate and regular rhythm.     Pulses: Normal pulses.     Heart sounds: Normal heart sounds.  Pulmonary:     Effort: Pulmonary effort is normal.     Breath sounds: Normal breath sounds. No wheezing, rhonchi or rales.  Musculoskeletal:       Arms:     Lumbar back: No swelling, edema, spasms, tenderness or bony tenderness. Normal range of motion.     Comments: Full range of motion with flexion, tension, lateral side bends.  Slight pain noted over right thoracic back.  No bony tenderness. No pain, numbness, tingling elicited with single leg raise bilaterally.   Skin:    General: Skin is warm and dry.  Neurological:     Mental Status: She is alert.     Sensory: No sensory deficit.     Deep Tendon Reflexes:     Reflex Scores:      Patellar reflexes are 2+ on the right side and 2+ on the left side.    Comments: Sensation and strength intact bilateral lower extremities.  Psychiatric:        Speech: Speech normal.        Behavior: Behavior normal.        Thought Content: Thought content normal.        Assessment & Plan:   Problem List Items Addressed This Visit       Other   Right low back pain - Primary    Acute.  Chronic low back pain however acutely right leg pain is more bothersome for patient today.  Pain radiating to right thigh.  Pending thoracic and lumbar x-ray.  Start prednisone.  Counseled on side effects of prednisone and how to take medication.  discussed pursuing MRI due to radicular symptoms most especially if pain does not respond to prednisone. We agreed conservative therapy at this time.  I have also provided meloxicam and placed a referral to physical therapy.  Counseled on the importance of heat /ice regimen.  She will let me know how she is doing       Relevant Medications   predniSONE (DELTASONE) 10 MG tablet   meloxicam (MOBIC) 7.5 MG tablet   Other Relevant Orders   DG Lumbar Spine Complete  DG Thoracic Spine W/Swimmers   Ambulatory referral to Physical Therapy      I have discontinued Crystal Reyes "Paige"'s doxycycline. I am also having her start on predniSONE and meloxicam. Additionally, I am having her maintain her multivitamin, fluconazole, methylphenidate, methylphenidate, methylphenidate, methylphenidate, methylphenidate, methylphenidate, methylphenidate, and methylphenidate.   Meds ordered this encounter  Medications   predniSONE (DELTASONE) 10 MG tablet    Sig: Take 4 tablets ( total 40 mg) by mouth for 2 days; take 3 tablets ( total 30 mg) by mouth for 2 days; take 2 tablets ( total 20 mg) by mouth for 1 day; take 1 tablet ( total 10 mg) by mouth for 1 day.    Dispense:  17 tablet    Refill:  0    Order Specific Question:   Supervising Provider    Answer:   Deborra Medina L [[id number]]   meloxicam (MOBIC) 7.5 MG tablet    Sig: Take 1 tablet (7.5 mg total) by mouth daily as needed for pain.    Dispense:  30 tablet    Refill:  1    Order Specific Question:   Supervising Provider    Answer:   Crecencio Mc [[id number]]    Return precautions given.   Risks, benefits, and alternatives of the medications and treatment plan prescribed today were discussed, and patient expressed understanding.   Education regarding symptom management and diagnosis given to patient on AVS.  Continue to follow with Crecencio Mc, MD for routine health maintenance.   Crystal Reyes and I agreed with plan.   Mable Paris, FNP

## 2022-02-07 ENCOUNTER — Encounter: Payer: Self-pay | Admitting: Internal Medicine

## 2022-02-07 DIAGNOSIS — F902 Attention-deficit hyperactivity disorder, combined type: Secondary | ICD-10-CM

## 2022-02-08 NOTE — Telephone Encounter (Signed)
Both the 20 mg and 5 mg were last refilled on 11/22/2021.  Last OV: 09/23/2021 Next OV: not scheduled

## 2022-02-09 MED ORDER — METHYLPHENIDATE HCL 5 MG PO TABS: 5.0000 mg | ORAL_TABLET | Freq: Two times a day (BID) | ORAL | 0 refills | Status: DC

## 2022-02-09 MED ORDER — METHYLPHENIDATE HCL 20 MG PO TABS: 20.0000 mg | ORAL_TABLET | Freq: Two times a day (BID) | ORAL | 0 refills | Status: DC

## 2022-03-15 ENCOUNTER — Other Ambulatory Visit: Payer: Self-pay | Admitting: Internal Medicine

## 2022-03-15 DIAGNOSIS — F902 Attention-deficit hyperactivity disorder, combined type: Secondary | ICD-10-CM

## 2022-03-16 ENCOUNTER — Other Ambulatory Visit: Payer: Self-pay | Admitting: Internal Medicine

## 2022-03-16 DIAGNOSIS — F902 Attention-deficit hyperactivity disorder, combined type: Secondary | ICD-10-CM

## 2022-03-16 MED ORDER — METHYLPHENIDATE HCL 5 MG PO TABS: 5.0000 mg | ORAL_TABLET | Freq: Two times a day (BID) | ORAL | 0 refills | Status: DC

## 2022-04-07 ENCOUNTER — Other Ambulatory Visit: Payer: Self-pay | Admitting: Family

## 2022-04-07 DIAGNOSIS — G8929 Other chronic pain: Secondary | ICD-10-CM

## 2022-04-10 ENCOUNTER — Other Ambulatory Visit: Payer: Self-pay | Admitting: Internal Medicine

## 2022-04-10 DIAGNOSIS — F902 Attention-deficit hyperactivity disorder, combined type: Secondary | ICD-10-CM

## 2022-04-11 ENCOUNTER — Other Ambulatory Visit: Payer: Self-pay | Admitting: Internal Medicine

## 2022-04-11 DIAGNOSIS — F902 Attention-deficit hyperactivity disorder, combined type: Secondary | ICD-10-CM

## 2022-04-11 MED ORDER — METHYLPHENIDATE HCL 20 MG PO TABS: 20.0000 mg | ORAL_TABLET | Freq: Two times a day (BID) | ORAL | 0 refills | Status: DC

## 2022-04-11 MED ORDER — METHYLPHENIDATE HCL 5 MG PO TABS: 5.0000 mg | ORAL_TABLET | Freq: Two times a day (BID) | ORAL | 0 refills | Status: DC

## 2022-04-11 NOTE — Telephone Encounter (Signed)
Please call pharmacy asap.  I am getting a message that E prescribign for her methylphenidate has failed (repeatedly)

## 2022-04-12 ENCOUNTER — Other Ambulatory Visit: Payer: Self-pay | Admitting: Internal Medicine

## 2022-04-12 DIAGNOSIS — F902 Attention-deficit hyperactivity disorder, combined type: Secondary | ICD-10-CM

## 2022-04-12 MED ORDER — METHYLPHENIDATE HCL 20 MG PO TABS: 20.0000 mg | ORAL_TABLET | Freq: Two times a day (BID) | ORAL | 0 refills | Status: DC

## 2022-04-12 MED ORDER — METHYLPHENIDATE HCL 5 MG PO TABS: 5.0000 mg | ORAL_TABLET | Freq: Two times a day (BID) | ORAL | 0 refills | Status: DC

## 2022-04-12 MED ORDER — METHYLPHENIDATE HCL 5 MG PO TABS: 5.0000 mg | ORAL_TABLET | Freq: Every day | ORAL | 0 refills | Status: DC

## 2022-04-12 MED ORDER — METHYLPHENIDATE HCL 20 MG PO TABS: 20.0000 mg | ORAL_TABLET | Freq: Every day | ORAL | 0 refills | Status: DC

## 2022-04-19 NOTE — Telephone Encounter (Signed)
MyChart messgae sent to patient. 

## 2022-05-16 ENCOUNTER — Other Ambulatory Visit: Payer: Self-pay | Admitting: Internal Medicine

## 2022-05-18 ENCOUNTER — Other Ambulatory Visit: Payer: Self-pay | Admitting: Internal Medicine

## 2022-05-18 MED ORDER — METHYLPHENIDATE HCL 20 MG PO TABS: 20.0000 mg | ORAL_TABLET | Freq: Two times a day (BID) | ORAL | 0 refills | Status: DC

## 2022-05-18 NOTE — Telephone Encounter (Signed)
  Patient comment: Sorry for another issue :). Pharmacy didnt have enough meds so they filled 45 pills and said you would need to call in a new script for the remainder for this month. There is apparebtly a shortage but they hoped to have more in a couple weeks.

## 2022-07-28 ENCOUNTER — Other Ambulatory Visit: Payer: Self-pay | Admitting: Internal Medicine

## 2022-07-28 DIAGNOSIS — Z1231 Encounter for screening mammogram for malignant neoplasm of breast: Secondary | ICD-10-CM

## 2022-07-31 ENCOUNTER — Other Ambulatory Visit: Payer: Self-pay | Admitting: Internal Medicine

## 2022-07-31 DIAGNOSIS — G8929 Other chronic pain: Secondary | ICD-10-CM

## 2022-08-10 ENCOUNTER — Other Ambulatory Visit: Payer: Self-pay | Admitting: Internal Medicine

## 2022-08-10 DIAGNOSIS — F988 Other specified behavioral and emotional disorders with onset usually occurring in childhood and adolescence: Secondary | ICD-10-CM

## 2022-08-10 DIAGNOSIS — F902 Attention-deficit hyperactivity disorder, combined type: Secondary | ICD-10-CM

## 2022-08-12 ENCOUNTER — Ambulatory Visit
Admission: RE | Admit: 2022-08-12 | Discharge: 2022-08-12 | Disposition: A | Discharge status: Home / Self Care | Payer: 59 | Source: Ambulatory Visit | Attending: Internal Medicine | Admitting: Internal Medicine

## 2022-08-12 ENCOUNTER — Encounter: Payer: Self-pay | Admitting: *Deleted

## 2022-08-12 DIAGNOSIS — Z1231 Encounter for screening mammogram for malignant neoplasm of breast: Secondary | ICD-10-CM | POA: Diagnosis not present

## 2022-08-12 MED ORDER — METHYLPHENIDATE HCL 5 MG PO TABS
5.0000 mg | ORAL_TABLET | Freq: Two times a day (BID) | ORAL | 0 refills | Status: DC
Start: 2022-08-12 — End: 2022-08-12

## 2022-08-12 MED ORDER — METHYLPHENIDATE HCL 5 MG PO TABS
5.0000 mg | ORAL_TABLET | Freq: Two times a day (BID) | ORAL | 0 refills | Status: DC
Start: 2022-08-12 — End: 2022-09-26

## 2022-08-12 NOTE — Telephone Encounter (Signed)
Requesting: Ritalin 20 mg Contract: Yes UDS: no Last Visit: 09/23/2021 Next Visit: Visit date not found Last Refill: 3/102024  Please Advise Sent message needs appointment.

## 2022-08-12 NOTE — Telephone Encounter (Signed)
I have refilled the 5 mg adderall for 3 months  but I keep getting a message that " e prescribing has failed" even though [employer] approved it.  Can you call her pharmacy to see if any went through

## 2022-08-15 ENCOUNTER — Other Ambulatory Visit: Payer: Self-pay | Admitting: Internal Medicine

## 2022-08-15 DIAGNOSIS — N6489 Other specified disorders of breast: Secondary | ICD-10-CM

## 2022-08-15 DIAGNOSIS — R928 Other abnormal and inconclusive findings on diagnostic imaging of breast: Secondary | ICD-10-CM

## 2022-08-26 ENCOUNTER — Ambulatory Visit
Admission: RE | Admit: 2022-08-26 | Discharge: 2022-08-26 | Disposition: A | Discharge status: Home / Self Care | Payer: 59 | Source: Ambulatory Visit | Attending: Internal Medicine | Admitting: Internal Medicine

## 2022-08-26 ENCOUNTER — Other Ambulatory Visit: Payer: 59

## 2022-08-26 DIAGNOSIS — R928 Other abnormal and inconclusive findings on diagnostic imaging of breast: Secondary | ICD-10-CM

## 2022-08-26 DIAGNOSIS — N6489 Other specified disorders of breast: Secondary | ICD-10-CM | POA: Diagnosis present

## 2022-09-26 ENCOUNTER — Ambulatory Visit: Payer: 59 | Admitting: Internal Medicine

## 2022-09-26 VITALS — BP 122/80 | HR 92 | Temp 98.7°F | Ht 64.0 in | Wt 149.0 lb

## 2022-09-26 DIAGNOSIS — E538 Deficiency of other specified B group vitamins: Secondary | ICD-10-CM

## 2022-09-26 DIAGNOSIS — F902 Attention-deficit hyperactivity disorder, combined type: Secondary | ICD-10-CM | POA: Diagnosis not present

## 2022-09-26 MED ORDER — METHYLPHENIDATE HCL 5 MG PO TABS
5.0000 mg | ORAL_TABLET | Freq: Two times a day (BID) | ORAL | 0 refills | Status: DC
Start: 2022-09-26 — End: 2022-09-26

## 2022-09-26 MED ORDER — METHYLPHENIDATE HCL 20 MG PO TABS: 20.0000 mg | ORAL_TABLET | Freq: Two times a day (BID) | ORAL | 0 refills | Status: DC

## 2022-09-26 MED ORDER — METHYLPHENIDATE HCL 5 MG PO TABS
5.0000 mg | ORAL_TABLET | Freq: Two times a day (BID) | ORAL | 0 refills | Status: DC
Start: 2022-09-26 — End: 2022-11-26

## 2022-09-26 MED ORDER — CYANOCOBALAMIN 1000 MCG/ML IJ SOLN
1000.0000 ug | Freq: Once | INTRAMUSCULAR | Status: AC
Start: 2022-09-26 — End: 2022-09-26
  Administered 2022-09-26: 1000 ug via INTRAMUSCULAR

## 2022-09-26 MED ORDER — METHYLPHENIDATE HCL 5 MG PO TABS
5.0000 mg | ORAL_TABLET | Freq: Two times a day (BID) | ORAL | 0 refills | Status: DC
Start: 2022-09-26 — End: 2022-10-19

## 2022-09-26 NOTE — Progress Notes (Signed)
Subjective:  Patient ID: Crystal Reyes, female    DOB: 11-13-1970  Age: 52 y.o. MRN: [id number]  CC: There were no encounter diagnoses.   HPI Crystal Reyes presents for  Chief Complaint  Patient presents with  . Medical Management of Chronic Issues   1) ADD:  taking generic Ritalin (methylphenidate)  25 mg twice daily (20 + 5 )   2) left breast mammogram abnormal:  additional images normalized   Outpatient Medications Prior to Visit  Medication Sig Dispense Refill  . fluconazole (DIFLUCAN) 200 MG tablet Take 200 mg by mouth once a week.    . meloxicam (MOBIC) 7.5 MG tablet TAKE 1 TABLET BY MOUTH EVERY DAY AS NEEDED FOR PAIN 30 tablet 1  . methylphenidate (RITALIN) 20 MG tablet Take 1 tablet (20 mg total) by mouth 2 (two) times daily with breakfast and lunch. To complete current mont prescripton 15 tablet 0  . methylphenidate (RITALIN) 5 MG tablet Take 1 tablet (5 mg total) by mouth 2 (two) times daily. TAKE WITH 20 MG DOSE TWICE Daily 60 tablet 0  . methylphenidate (RITALIN) 5 MG tablet Take 1 tablet (5 mg total) by mouth 2 (two) times daily. TAKE WITH 20 MG DOSE TWICE Daily 60 tablet 0  . Multiple Vitamin (MULTIVITAMIN) tablet Take 1 tablet by mouth daily.    . methylphenidate (RITALIN) 20 MG tablet Take 1 tablet (20 mg total) by mouth 2 (two) times daily with breakfast and lunch. 60 tablet 0  . methylphenidate (RITALIN) 20 MG tablet Take 1 tablet (20 mg total) by mouth 2 (two) times daily with breakfast and lunch. 60 tablet 0  . methylphenidate (RITALIN) 20 MG tablet Take 1 tablet (20 mg total) by mouth 2 (two) times daily with breakfast and lunch. 60 tablet 0  . predniSONE (DELTASONE) 10 MG tablet Take 4 tablets ( total 40 mg) by mouth for 2 days; take 3 tablets ( total 30 mg) by mouth for 2 days; take 2 tablets ( total 20 mg) by mouth for 1 day; take 1 tablet ( total 10 mg) by mouth for 1 day. (Patient not taking: Reported on 09/26/2022) 17 tablet 0   No  facility-administered medications prior to visit.    Review of Systems;  Patient denies headache, fevers, malaise, unintentional weight loss, skin rash, eye pain, sinus congestion and sinus pain, sore throat, dysphagia,  hemoptysis , cough, dyspnea, wheezing, chest pain, palpitations, orthopnea, edema, abdominal pain, nausea, melena, diarrhea, constipation, flank pain, dysuria, hematuria, urinary  Frequency, nocturia, numbness, tingling, seizures,  Focal weakness, Loss of consciousness,  Tremor, insomnia, depression, anxiety, and suicidal ideation.      Objective:  BP 122/80   Pulse 92   Temp 98.7 F (37.1 C) (Oral)   Ht 5\' 4"  (1.626 m)   Wt 149 lb (67.6 kg)   SpO2 98%   BMI 25.58 kg/m   BP Readings from Last 3 Encounters:  09/26/22 122/80  01/31/22 114/72  09/23/21 98/70    Wt Readings from Last 3 Encounters:  09/26/22 149 lb (67.6 kg)  01/31/22 146 lb (66.2 kg)  09/23/21 147 lb 6.4 oz (66.9 kg)    Physical Exam  No results found for: "HGBA1C"  Lab Results  Component Value Date   CREATININE 0.75 09/23/2021   CREATININE 0.84 04/07/2020   CREATININE 0.75 07/13/2018    Lab Results  Component Value Date   WBC 6.0 04/07/2020   HGB 13.6 04/07/2020   HCT 40.9 04/07/2020  PLT 272.0 04/07/2020   GLUCOSE 93 09/23/2021   CHOL 244 (H) 09/23/2021   TRIG 78.0 09/23/2021   HDL 65.20 09/23/2021   LDLDIRECT 163.0 09/23/2021   LDLCALC 163 (H) 09/23/2021   ALT 8 09/23/2021   AST 16 09/23/2021   NA 138 09/23/2021   K 4.0 09/23/2021   CL 103 09/23/2021   CREATININE 0.75 09/23/2021   BUN 10 09/23/2021   CO2 28 09/23/2021   TSH 2.56 09/23/2021    MM 3D DIAGNOSTIC MAMMOGRAM UNILATERAL LEFT BREAST  Result Date: 08/26/2022 CLINICAL DATA:  52 year old female recalled from screening mammography for a left breast asymmetry. EXAM: DIGITAL DIAGNOSTIC UNILATERAL LEFT MAMMOGRAM WITH TOMOSYNTHESIS TECHNIQUE: Left digital diagnostic mammography and breast tomosynthesis was  performed. COMPARISON:  Previous exam(s). ACR Breast Density Category b: There are scattered areas of fibroglandular density. FINDINGS: The asymmetry in the upper inner left breast resolves on spot compression tomosynthesis imaging. A repeat full paddle CC image demonstrates stability of the tissue compared with prior. IMPRESSION: Resolution of the left breast asymmetry consistent with overlapping fibroglandular tissue. RECOMMENDATION: Screening mammogram in one year.(Code:SM-B-01Y) I have discussed the findings and recommendations with the patient. If applicable, a reminder letter will be sent to the patient regarding the next appointment. BI-RADS CATEGORY  1: Negative. Electronically Signed   By: Frederico Hamman M.D.   On: 08/26/2022 09:39    Assessment & Plan:  .There are no diagnoses linked to this encounter.   I provided 30 minutes of face-to-face time during this encounter reviewing patient's last visit with me, patient's  most recent visit with cardiology,  nephrology,  and neurology,  recent surgical and non surgical procedures, previous  labs and imaging studies, counseling on currently addressed issues,  and post visit ordering to diagnostics and therapeutics .   Follow-up: No follow-ups on file.   Sherlene Shams, MD

## 2022-09-27 DIAGNOSIS — E538 Deficiency of other specified B group vitamins: Secondary | ICD-10-CM | POA: Insufficient documentation

## 2022-09-27 NOTE — Assessment & Plan Note (Signed)
Patient is functioning  well  without side effects on current dose of  25 mg bid

## 2022-09-27 NOTE — Assessment & Plan Note (Signed)
Starting weekly injections.  Checking  IF antibody

## 2022-09-29 LAB — INTRINSIC FACTOR ANTIBODIES: Intrinsic Factor: POSITIVE — AB

## 2022-09-30 ENCOUNTER — Encounter: Payer: Self-pay | Admitting: Internal Medicine

## 2022-09-30 DIAGNOSIS — E538 Deficiency of other specified B group vitamins: Secondary | ICD-10-CM

## 2022-09-30 DIAGNOSIS — D510 Vitamin B12 deficiency anemia due to intrinsic factor deficiency: Secondary | ICD-10-CM

## 2022-10-01 ENCOUNTER — Encounter: Payer: Self-pay | Admitting: Internal Medicine

## 2022-10-06 ENCOUNTER — Encounter: Payer: Self-pay | Admitting: Internal Medicine

## 2022-10-06 ENCOUNTER — Ambulatory Visit (INDEPENDENT_AMBULATORY_CARE_PROVIDER_SITE_OTHER): Payer: 59

## 2022-10-06 DIAGNOSIS — E538 Deficiency of other specified B group vitamins: Secondary | ICD-10-CM | POA: Diagnosis not present

## 2022-10-06 MED ORDER — CYANOCOBALAMIN 1000 MCG/ML IJ SOLN
1000.0000 ug | Freq: Once | INTRAMUSCULAR | Status: AC
Start: 2022-10-06 — End: 2022-10-06
  Administered 2022-10-06: 1000 ug via INTRAMUSCULAR

## 2022-10-06 NOTE — Progress Notes (Signed)
Patient arrived for a B12 injection and it was administered into her right deltoid. Patient tolerated the injection well and did not show any signs of distress or voice any concerns. 

## 2022-10-13 ENCOUNTER — Ambulatory Visit (INDEPENDENT_AMBULATORY_CARE_PROVIDER_SITE_OTHER): Payer: 59 | Admitting: *Deleted

## 2022-10-13 DIAGNOSIS — E538 Deficiency of other specified B group vitamins: Secondary | ICD-10-CM | POA: Diagnosis not present

## 2022-10-13 MED ORDER — CYANOCOBALAMIN 1000 MCG/ML IJ SOLN
1000.0000 ug | Freq: Once | INTRAMUSCULAR | Status: AC
Start: 2022-10-13 — End: 2022-10-13
  Administered 2022-10-13: 1000 ug via INTRAMUSCULAR

## 2022-10-13 MED ORDER — CYANOCOBALAMIN 1000 MCG/ML IJ SOLN
1000.0000 ug | Freq: Once | INTRAMUSCULAR | 0 refills | Status: AC
Start: 2022-10-13 — End: 2022-10-13

## 2022-10-13 NOTE — Telephone Encounter (Signed)
Pt came in for weekly B12 & teaching today. Medication sent to [facility].   Pt wasn't to know when she can have her B12 level rechecked.

## 2022-10-13 NOTE — Progress Notes (Signed)
Teaching given on B12 injections and the patient expresses understanding and acceptance of instructions.   Pt self administered B12 injection in right upper outer thigh with supervision and direction. Pt had no questions or concerns & tolerated it well.

## 2022-10-19 ENCOUNTER — Other Ambulatory Visit: Payer: Self-pay | Admitting: Internal Medicine

## 2022-10-19 DIAGNOSIS — F902 Attention-deficit hyperactivity disorder, combined type: Secondary | ICD-10-CM

## 2022-10-20 MED ORDER — METHYLPHENIDATE HCL 20 MG PO TABS: 20.0000 mg | ORAL_TABLET | Freq: Two times a day (BID) | ORAL | 0 refills | Status: DC

## 2022-10-20 MED ORDER — METHYLPHENIDATE HCL 5 MG PO TABS
5.0000 mg | ORAL_TABLET | Freq: Two times a day (BID) | ORAL | 0 refills | Status: DC
Start: 2022-10-20 — End: 2022-12-26

## 2022-10-20 NOTE — Telephone Encounter (Signed)
Requesting: Ritalin 5 and 20 mg Contract: No UDS: no Last Visit: 09/26/2022 Next Visit: Visit date not found Last Refill: 09/2022  Please Advise

## 2022-11-07 MED ORDER — "SYRINGE 25G X 1"" 3 ML MISC": 3 refills | Status: DC

## 2022-11-10 NOTE — Addendum Note (Signed)
Addended by: Sherlene Shams on: 11/10/2022 09:02 AM   Modules accepted: Orders

## 2022-11-18 ENCOUNTER — Inpatient Hospital Stay: Payer: 59 | Attending: Oncology | Admitting: Oncology

## 2022-11-18 ENCOUNTER — Other Ambulatory Visit: Payer: Self-pay

## 2022-11-18 ENCOUNTER — Encounter: Payer: Self-pay | Admitting: Oncology

## 2022-11-18 ENCOUNTER — Inpatient Hospital Stay: Payer: 59

## 2022-11-18 VITALS — BP 134/73 | HR 66 | Temp 97.0°F | Resp 16 | Wt 151.0 lb

## 2022-11-18 DIAGNOSIS — E538 Deficiency of other specified B group vitamins: Secondary | ICD-10-CM | POA: Insufficient documentation

## 2022-11-18 DIAGNOSIS — D510 Vitamin B12 deficiency anemia due to intrinsic factor deficiency: Secondary | ICD-10-CM

## 2022-11-18 LAB — CBC WITH DIFFERENTIAL/PLATELET
Abs Immature Granulocytes: 0.01 10*3/uL (ref 0.00–0.07)
Basophils Absolute: 0.1 10*3/uL (ref 0.0–0.1)
Basophils Relative: 1 %
Eosinophils Absolute: 0.3 10*3/uL (ref 0.0–0.5)
Eosinophils Relative: 5 %
HCT: 39.9 % (ref 36.0–46.0)
Hemoglobin: 13 g/dL (ref 12.0–15.0)
Immature Granulocytes: 0 %
Lymphocytes Relative: 33 %
Lymphs Abs: 2 10*3/uL (ref 0.7–4.0)
MCH: 29 pg (ref 26.0–34.0)
MCHC: 32.6 g/dL (ref 30.0–36.0)
MCV: 89.1 fL (ref 80.0–100.0)
Monocytes Absolute: 0.4 10*3/uL (ref 0.1–1.0)
Monocytes Relative: 7 %
Neutro Abs: 3.2 10*3/uL (ref 1.7–7.7)
Neutrophils Relative %: 54 %
Platelets: 258 10*3/uL (ref 150–400)
RBC: 4.48 MIL/uL (ref 3.87–5.11)
RDW: 11.8 % (ref 11.5–15.5)
WBC: 5.9 10*3/uL (ref 4.0–10.5)
nRBC: 0 % (ref 0.0–0.2)

## 2022-11-18 LAB — FOLATE: Folate: 15.6 ng/mL (ref >5.9)

## 2022-11-18 LAB — VITAMIN B12: Vitamin B-12: 186 pg/mL (ref 180–914)

## 2022-11-18 NOTE — Assessment & Plan Note (Addendum)
Positive intrinsic factor antibody-recommend pernicious anemia. I agree with parental vitamin B12 injections and monitor B12 levels periodically. Patient is interested to have vitamin B12 injections administrated at our facility.  Will arrange. Check vitamin B12 level, antiparietal antibody, folate, CBC.  I will refer patient to gastroenterology to rule out atrophic gastritis.  She is interested in getting establish with local GI.  Will refer to [facility]

## 2022-11-18 NOTE — Progress Notes (Signed)
Patient here for new heme concerns of fatigue and weight gain

## 2022-11-18 NOTE — Progress Notes (Signed)
Hematology/Oncology Consult note [phone] [phone]        REFERRING PROVIDER: Sherlene Shams, MD   CHIEF COMPLAINTS/REASON FOR VISIT:  Evaluation of B12 deficiency   ASSESSMENT & PLAN:   B12 deficiency Positive intrinsic factor antibody-recommend pernicious anemia. I agree with parental vitamin B12 injections and monitor B12 levels periodically. Patient is interested to have vitamin B12 injections administrated at our facility.  Will arrange. Check vitamin B12 level, antiparietal antibody, folate, CBC.   Orders Placed This Encounter  Procedures   Folate    Standing Status:   Future    Number of Occurrences:   1    Standing Expiration Date:   11/18/2023   Vitamin B12    Standing Status:   Future    Number of Occurrences:   1    Standing Expiration Date:   11/18/2023   CBC with Differential/Platelet    Standing Status:   Future    Number of Occurrences:   1    Standing Expiration Date:   11/18/2023   Anti-parietal antibody    Standing Status:   Future    Number of Occurrences:   1    Standing Expiration Date:   11/18/2023   Follow-up in 6 months. All questions were answered. The patient knows to call the clinic with any problems, questions or concerns.  Rickard Patience, MD, PhD [facility] Hematology Oncology 11/18/2022   HISTORY OF PRESENTING ILLNESS:   Crystal Reyes is a  52 y.o.  female with PMH listed below was seen in consultation at the request of  Sherlene Shams, MD  for evaluation of vitamin B-12 deficiency, positive intrinsic factor antibody  Patient has reported feeling fatigued lately. 09/23/2022, her blood work showed B12 of 98,  09/26/2022 positive intrinsic factor antibody Patient has been started on vitamin B12 1000 mcg injection weekly followed by monthly. She was referred to hematology for further evaluation discussion. + Weight gain.  Denies any paresthesia, gait changes, memory loss. She drinks alcohol  socially.  MEDICAL HISTORY:  Past Medical History:  Diagnosis Date   Abnormal Pap smear of cervix 2010   normal biopsy, followup with GYN   Anemia    Attention deficit disorder of adult without mention of hyperactivity    managed with methylphenidate   B12 deficiency    Basal cell carcinoma    Chronic back pain    COVID-19 virus infection 07/09/2020   Headache     SURGICAL HISTORY: Past Surgical History:  Procedure Laterality Date   BUNIONECTOMY     COLPOSCOPY      SOCIAL HISTORY: Social History   Socioeconomic History   Marital status: Married    Spouse name: Not on file   Number of children: Not on file   Years of education: Not on file   Highest education level: Professional school degree (e.g., MD, DDS, DVM, JD)  Occupational History   Occupation: attorney    Employer: [employer]  Tobacco Use   Smoking status: Never   Smokeless tobacco: Never  Vaping Use   Vaping status: Never Used  Substance and Sexual Activity   Alcohol use: Yes    Comment: wine occasional   Drug use: No   Sexual activity: Yes    Birth control/protection: None  Other Topics Concern   Not on file  Social History Narrative   Not on file   Social Determinants of Health   Financial Resource Strain: Low Risk  (09/26/2022)   Overall  Financial Resource Strain (CARDIA)    Difficulty of Paying Living Expenses: Not very hard  Food Insecurity: No Food Insecurity (09/26/2022)   Hunger Vital Sign    Worried About Running Out of Food in the Last Year: Never true    Ran Out of Food in the Last Year: Never true  Transportation Needs: No Transportation Needs (09/26/2022)   PRAPARE - Administrator, Civil Service (Medical): No    Lack of Transportation (Non-Medical): No  Physical Activity: Insufficiently Active (09/26/2022)   Exercise Vital Sign    Days of Exercise per Week: 1 day    Minutes of Exercise per Session: 30 min  Stress: No Stress Concern Present (09/26/2022)    [employer] of Occupational Health - Occupational Stress Questionnaire    Feeling of Stress : Only a little  Social Connections: Socially Integrated (09/26/2022)   Social Connection and Isolation Panel [NHANES]    Frequency of Communication with Friends and Family: More than three times a week    Frequency of Social Gatherings with Friends and Family: Once a week    Attends Religious Services: 1 to 4 times per year    Active Member of [employer] or Organizations: Yes    Attends Engineer, structural: More than 4 times per year    Marital Status: Married  Catering manager Violence: Not on file    FAMILY HISTORY: Family History  Problem Relation Age of Onset   Hypertension Mother    Hyperlipidemia Mother    Colon polyps Father 9   Hyperlipidemia Father    Hypertension Father    Colon polyps Sister 72   Heart disease Maternal Grandfather    Cancer Paternal Grandmother 79       melanoma   Colon cancer Paternal Grandfather    Breast cancer Cousin        2nd cousin    ALLERGIES:  is allergic to codeine.  MEDICATIONS:  Current Outpatient Medications  Medication Sig Dispense Refill   acetaminophen (TYLENOL) 325 MG tablet Take 650 mg by mouth every 6 (six) hours as needed.     meloxicam (MOBIC) 7.5 MG tablet TAKE 1 TABLET BY MOUTH EVERY DAY AS NEEDED FOR PAIN 30 tablet 1   methylphenidate (RITALIN) 20 MG tablet Take 1 tablet (20 mg total) by mouth 2 (two) times daily with breakfast and lunch. To complete current mont prescripton 15 tablet 0   methylphenidate (RITALIN) 20 MG tablet Take 1 tablet (20 mg total) by mouth 2 (two) times daily with breakfast and lunch. 60 tablet 0   methylphenidate (RITALIN) 5 MG tablet Take 1 tablet (5 mg total) by mouth 2 (two) times daily. TAKE WITH 20 MG DOSE TWICE Daily 60 tablet 0   methylphenidate (RITALIN) 5 MG tablet Take 1 tablet (5 mg total) by mouth 2 (two) times daily. TAKE WITH 20 MG DOSE TWICE Daily 60 tablet 0   Multiple Vitamin  (MULTIVITAMIN) tablet Take 1 tablet by mouth daily.     Syringe/Needle, Disp, (SYRINGE 3CC/25GX1") 25G X 1" 3 ML MISC Use to give b12 injection once monthly 3 each 3   cyanocobalamin (VITAMIN B12) 1000 MCG tablet Take 1,000 mcg by mouth daily. (Patient not taking: Reported on 11/18/2022)     fluconazole (DIFLUCAN) 200 MG tablet Take 200 mg by mouth once a week.     methylphenidate (RITALIN) 20 MG tablet Take 1 tablet (20 mg total) by mouth 2 (two) times daily with breakfast and lunch. 60 tablet 0  methylphenidate (RITALIN) 20 MG tablet Take 1 tablet (20 mg total) by mouth 2 (two) times daily with breakfast and lunch. 60 tablet 0   No current facility-administered medications for this visit.    Review of Systems  Constitutional:  Positive for fatigue. Negative for appetite change, chills and fever.       Weight gain  HENT:   Negative for hearing loss and voice change.   Eyes:  Negative for eye problems.  Respiratory:  Negative for chest tightness and cough.   Cardiovascular:  Negative for chest pain.  Gastrointestinal:  Negative for abdominal distention, abdominal pain and blood in stool.  Endocrine: Negative for hot flashes.  Genitourinary:  Negative for difficulty urinating and frequency.   Musculoskeletal:  Negative for arthralgias.  Skin:  Negative for itching and rash.  Neurological:  Negative for extremity weakness.  Hematological:  Negative for adenopathy.  Psychiatric/Behavioral:  Negative for confusion.    PHYSICAL EXAMINATION:  Vitals:   11/18/22 0955  BP: 134/73  Pulse: 66  Resp: 16  Temp: (!) 97 F (36.1 C)  SpO2: 100%   Filed Weights   11/18/22 0955  Weight: 151 lb (68.5 kg)    Physical Exam Constitutional:      General: She is not in acute distress. HENT:     Head: Normocephalic and atraumatic.  Eyes:     General: No scleral icterus. Cardiovascular:     Rate and Rhythm: Normal rate and regular rhythm.     Heart sounds: Normal heart sounds.  Pulmonary:      Effort: Pulmonary effort is normal. No respiratory distress.     Breath sounds: No wheezing.  Abdominal:     General: Bowel sounds are normal. There is no distension.     Palpations: Abdomen is soft.  Musculoskeletal:        General: No deformity. Normal range of motion.     Cervical back: Normal range of motion and neck supple.  Skin:    General: Skin is warm and dry.     Findings: No erythema or rash.  Neurological:     Mental Status: She is alert and oriented to person, place, and time. Mental status is at baseline.     Cranial Nerves: No cranial nerve deficit.     Coordination: Coordination normal.  Psychiatric:        Mood and Affect: Mood normal.     LABORATORY DATA:  I have reviewed the data as listed    Latest Ref Rng & Units 11/18/2022   10:23 AM 04/07/2020    9:35 AM 07/13/2018    8:21 AM  CBC  WBC 4.0 - 10.5 K/uL 5.9  6.0  5.6   Hemoglobin 12.0 - 15.0 g/dL 16.1  09.6  04.5   Hematocrit 36.0 - 46.0 % 39.9  40.9  39.6   Platelets 150 - 400 K/uL 258  272.0  263.0       Latest Ref Rng & Units 09/23/2021    9:29 AM 04/07/2020    9:35 AM 07/13/2018    8:21 AM  CMP  Glucose 70 - 99 mg/dL 93  94  85   BUN 6 - 23 mg/dL 10  8  11    Creatinine 0.40 - 1.20 mg/dL 4.09  8.11  9.14   Sodium 135 - 145 mEq/L 138  137  139   Potassium 3.5 - 5.1 mEq/L 4.0  4.1  3.6   Chloride 96 - 112 mEq/L 103  102  105  CO2 19 - 32 mEq/L 28  31  29    Calcium 8.4 - 10.5 mg/dL 8.8  9.0  8.7   Total Protein 6.0 - 8.3 g/dL 6.7  6.8  6.7   Total Bilirubin 0.2 - 1.2 mg/dL 0.6  0.5  0.6   Alkaline Phos 39 - 117 U/L 59  61  55   AST 0 - 37 U/L 16  14  14    ALT 0 - 35 U/L 8  5  7        RADIOGRAPHIC STUDIES: I have personally reviewed the radiological images as listed and agreed with the findings in the report. MM 3D DIAGNOSTIC MAMMOGRAM UNILATERAL LEFT BREAST  Result Date: 08/26/2022 CLINICAL DATA:  52 year old female recalled from screening mammography for a left breast asymmetry. EXAM:  DIGITAL DIAGNOSTIC UNILATERAL LEFT MAMMOGRAM WITH TOMOSYNTHESIS TECHNIQUE: Left digital diagnostic mammography and breast tomosynthesis was performed. COMPARISON:  Previous exam(s). ACR Breast Density Category b: There are scattered areas of fibroglandular density. FINDINGS: The asymmetry in the upper inner left breast resolves on spot compression tomosynthesis imaging. A repeat full paddle CC image demonstrates stability of the tissue compared with prior. IMPRESSION: Resolution of the left breast asymmetry consistent with overlapping fibroglandular tissue. RECOMMENDATION: Screening mammogram in one year.(Code:SM-B-01Y) I have discussed the findings and recommendations with the patient. If applicable, a reminder letter will be sent to the patient regarding the next appointment. BI-RADS CATEGORY  1: Negative. Electronically Signed   By: Frederico Hamman M.D.   On: 08/26/2022 09:39

## 2022-11-21 ENCOUNTER — Telehealth: Payer: Self-pay

## 2022-11-21 ENCOUNTER — Inpatient Hospital Stay: Payer: 59

## 2022-11-21 DIAGNOSIS — E538 Deficiency of other specified B group vitamins: Secondary | ICD-10-CM

## 2022-11-21 LAB — ANTI-PARIETAL ANTIBODY: Parietal Cell Antibody-IgG: 3.6 Units (ref 0.0–20.0)

## 2022-11-21 MED ORDER — CYANOCOBALAMIN 1000 MCG/ML IJ SOLN
1000.0000 ug | Freq: Once | INTRAMUSCULAR | Status: AC
  Administered 2022-11-21: 1000 ug via INTRAMUSCULAR
  Filled 2022-11-21: qty 1

## 2022-11-21 NOTE — Telephone Encounter (Signed)
-----   Message from Rickard Patience sent at 11/20/2022  4:41 PM EDT ----- Please arrange her to get B12 injections monthly x 5  after 11/21/22 Arrange follow up appt in 6 months, labs a few days prior to MD + B12 injection.  Please order cbc, B12

## 2022-11-24 NOTE — Telephone Encounter (Signed)
Please see appt change request.

## 2022-11-26 ENCOUNTER — Other Ambulatory Visit: Payer: Self-pay | Admitting: Internal Medicine

## 2022-11-26 DIAGNOSIS — F902 Attention-deficit hyperactivity disorder, combined type: Secondary | ICD-10-CM

## 2022-11-29 MED ORDER — METHYLPHENIDATE HCL 5 MG PO TABS
5.0000 mg | ORAL_TABLET | Freq: Two times a day (BID) | ORAL | 0 refills | Status: DC
Start: 2022-11-29 — End: 2022-12-24

## 2022-11-29 MED ORDER — METHYLPHENIDATE HCL 20 MG PO TABS: 20.0000 mg | ORAL_TABLET | Freq: Two times a day (BID) | ORAL | 0 refills | Status: DC

## 2022-12-16 ENCOUNTER — Inpatient Hospital Stay: Payer: 59 | Attending: Oncology

## 2022-12-16 ENCOUNTER — Other Ambulatory Visit: Payer: Self-pay | Admitting: Oncology

## 2022-12-16 DIAGNOSIS — E538 Deficiency of other specified B group vitamins: Secondary | ICD-10-CM | POA: Diagnosis present

## 2022-12-16 MED ORDER — CYANOCOBALAMIN 1000 MCG/ML IJ SOLN
1000.0000 ug | Freq: Once | INTRAMUSCULAR | Status: AC
  Administered 2022-12-16: 1000 ug via INTRAMUSCULAR
  Filled 2022-12-16: qty 1

## 2022-12-22 ENCOUNTER — Inpatient Hospital Stay: Payer: 59

## 2022-12-24 ENCOUNTER — Other Ambulatory Visit: Payer: Self-pay | Admitting: Internal Medicine

## 2022-12-24 DIAGNOSIS — F902 Attention-deficit hyperactivity disorder, combined type: Secondary | ICD-10-CM

## 2022-12-26 MED ORDER — METHYLPHENIDATE HCL 20 MG PO TABS: 20.0000 mg | ORAL_TABLET | Freq: Two times a day (BID) | ORAL | 0 refills | Status: DC

## 2022-12-26 MED ORDER — METHYLPHENIDATE HCL 5 MG PO TABS
5.0000 mg | ORAL_TABLET | Freq: Two times a day (BID) | ORAL | 0 refills | Status: DC
Start: 2022-12-26 — End: 2022-12-26

## 2022-12-26 MED ORDER — METHYLPHENIDATE HCL 5 MG PO TABS
5.0000 mg | ORAL_TABLET | Freq: Two times a day (BID) | ORAL | 0 refills | Status: DC
Start: 2022-12-26 — End: 2023-01-30

## 2022-12-26 MED ORDER — METHYLPHENIDATE HCL 5 MG PO TABS
5.0000 mg | ORAL_TABLET | Freq: Two times a day (BID) | ORAL | 0 refills | Status: DC
Start: 2022-12-26 — End: 2023-01-18

## 2023-01-18 ENCOUNTER — Other Ambulatory Visit: Payer: Self-pay | Admitting: Internal Medicine

## 2023-01-18 DIAGNOSIS — F902 Attention-deficit hyperactivity disorder, combined type: Secondary | ICD-10-CM

## 2023-01-19 ENCOUNTER — Inpatient Hospital Stay: Payer: 59 | Attending: Oncology

## 2023-01-19 DIAGNOSIS — E538 Deficiency of other specified B group vitamins: Secondary | ICD-10-CM | POA: Diagnosis present

## 2023-01-19 MED ORDER — METHYLPHENIDATE HCL 20 MG PO TABS: 20.0000 mg | ORAL_TABLET | Freq: Two times a day (BID) | ORAL | 0 refills | Status: DC

## 2023-01-19 MED ORDER — METHYLPHENIDATE HCL 5 MG PO TABS: 5.0000 mg | ORAL_TABLET | Freq: Two times a day (BID) | ORAL | 0 refills | Status: DC

## 2023-01-19 MED ORDER — CYANOCOBALAMIN 1000 MCG/ML IJ SOLN
1000.0000 ug | Freq: Once | INTRAMUSCULAR | Status: AC
  Administered 2023-01-19: 1000 ug via INTRAMUSCULAR
  Filled 2023-01-19: qty 1

## 2023-01-23 ENCOUNTER — Inpatient Hospital Stay: Payer: 59

## 2023-01-30 ENCOUNTER — Encounter: Payer: Self-pay | Admitting: Physician Assistant

## 2023-01-30 ENCOUNTER — Ambulatory Visit: Payer: 59 | Admitting: Physician Assistant

## 2023-01-30 VITALS — BP 122/80 | HR 98 | Ht 64.0 in | Wt 153.0 lb

## 2023-01-30 DIAGNOSIS — E538 Deficiency of other specified B group vitamins: Secondary | ICD-10-CM | POA: Diagnosis not present

## 2023-01-30 NOTE — Progress Notes (Signed)
Chief Complaint: B12 deficiency  HPI:    Mrs.  Crystal Reyes is a 52 year old female with a past medical history as listed below including B12 deficiency, known to Dr. Rhea Belton, who was referred to me by Sherlene Shams, MD for a complaint of B12 deficiency and concern for atrophic gastritis.      06/21/2021 colonoscopy for screening with diverticulosis in the sigmoid, descending, transverse and ascending colon as well as small internal hemorrhoids.  Repeat recommended 10 years.    09/23/2022 B12 98.    11/18/2022 B12 normal at 186.    11/18/2022 patient followed with heme/unk for B12 deficiency.  At that time noted positive intrinsic factor antibody.  She was being given parenteral vitamin B12 injections.  They recommended GI consult for atrophic gastritis.    Today, patient tells me she has never had any upper GI symptoms.  Extreme fatigue was one of the main symptoms of her B12 deficiency.  She denies any history of heartburn, reflux or epigastric abdominal pain.  No family history of B12 deficiency.    Denies fever, chills or weight loss.  Past Medical History:  Diagnosis Date   Abnormal Pap smear of cervix 2010   normal biopsy, followup with GYN   Anemia    Attention deficit disorder of adult without mention of hyperactivity    managed with methylphenidate   B12 deficiency    Basal cell carcinoma    Chronic back pain    COVID-19 virus infection 07/09/2020   Headache     Past Surgical History:  Procedure Laterality Date   BUNIONECTOMY     COLPOSCOPY      Current Outpatient Medications  Medication Sig Dispense Refill   fluconazole (DIFLUCAN) 200 MG tablet Take 200 mg by mouth once a week.     methylphenidate (RITALIN) 20 MG tablet Take 1 tablet (20 mg total) by mouth 2 (two) times daily with breakfast and lunch. To complete current mont prescripton 15 tablet 0   methylphenidate (RITALIN) 20 MG tablet Take 1 tablet (20 mg total) by mouth 2 (two) times daily with breakfast and lunch. 60  tablet 0   methylphenidate (RITALIN) 20 MG tablet Take 1 tablet (20 mg total) by mouth 2 (two) times daily with breakfast and lunch. 60 tablet 0   methylphenidate (RITALIN) 5 MG tablet Take 1 tablet (5 mg total) by mouth 2 (two) times daily. TAKE WITH 20 MG DOSE TWICE Daily 60 tablet 0   methylphenidate (RITALIN) 5 MG tablet Take 1 tablet (5 mg total) by mouth 2 (two) times daily. TAKE WITH 20 MG DOSE TWICE Daily 60 tablet 0   Multiple Vitamin (MULTIVITAMIN) tablet Take 1 tablet by mouth daily.     OVER THE COUNTER MEDICATION Ptt aking lions manes powder     Red Yeast Rice Extract (RED YEAST RICE PO) Take by mouth.     Syringe/Needle, Disp, (SYRINGE 3CC/25GX1") 25G X 1" 3 ML MISC Use to give b12 injection once monthly 3 each 3   acetaminophen (TYLENOL) 325 MG tablet Take 650 mg by mouth every 6 (six) hours as needed. (Patient not taking: Reported on 01/30/2023)     cyanocobalamin (VITAMIN B12) 1000 MCG tablet Take 1,000 mcg by mouth daily. (Patient not taking: Reported on 11/18/2022)     meloxicam (MOBIC) 7.5 MG tablet TAKE 1 TABLET BY MOUTH EVERY DAY AS NEEDED FOR PAIN (Patient not taking: Reported on 01/30/2023) 30 tablet 1   methylphenidate (RITALIN) 20 MG tablet Take 1 tablet (20  mg total) by mouth 2 (two) times daily with breakfast and lunch. 60 tablet 0   No current facility-administered medications for this visit.    Allergies as of 01/30/2023 - Review Complete 01/30/2023  Allergen Reaction Noted   Codeine  02/09/2011    Family History  Problem Relation Age of Onset   Hypertension Mother    Hyperlipidemia Mother    Colon polyps Father 16   Hyperlipidemia Father    Hypertension Father    Colon polyps Sister 38   Heart disease Maternal Grandfather    Cancer Paternal Grandmother 69       melanoma   Colon cancer Paternal Grandfather    Breast cancer Cousin        2nd cousin    Social History   Socioeconomic History   Marital status: Married    Spouse name: Not on file    Number of children: Not on file   Years of education: Not on file   Highest education level: Professional school degree (e.g., MD, DDS, DVM, JD)  Occupational History   Occupation: attorney    Employer: [employer]  Tobacco Use   Smoking status: Never   Smokeless tobacco: Never  Vaping Use   Vaping status: Never Used  Substance and Sexual Activity   Alcohol use: Yes    Comment: wine occasional   Drug use: No   Sexual activity: Yes    Birth control/protection: None  Other Topics Concern   Not on file  Social History Narrative   Not on file   Social Determinants of Health   Financial Resource Strain: Low Risk  (09/26/2022)   Overall Financial Resource Strain (CARDIA)    Difficulty of Paying Living Expenses: Not very hard  Food Insecurity: No Food Insecurity (09/26/2022)   Hunger Vital Sign    Worried About Running Out of Food in the Last Year: Never true    Ran Out of Food in the Last Year: Never true  Transportation Needs: No Transportation Needs (09/26/2022)   PRAPARE - Administrator, Civil Service (Medical): No    Lack of Transportation (Non-Medical): No  Physical Activity: Insufficiently Active (09/26/2022)   Exercise Vital Sign    Days of Exercise per Week: 1 day    Minutes of Exercise per Session: 30 min  Stress: No Stress Concern Present (09/26/2022)   [employer] of Occupational Health - Occupational Stress Questionnaire    Feeling of Stress : Only a little  Social Connections: Socially Integrated (09/26/2022)   Social Connection and Isolation Panel [NHANES]    Frequency of Communication with Friends and Family: More than three times a week    Frequency of Social Gatherings with Friends and Family: Once a week    Attends Religious Services: 1 to 4 times per year    Active Member of [employer] or Organizations: Yes    Attends Engineer, structural: More than 4 times per year    Marital Status: Married  Catering manager Violence: Not on  file    Review of Systems:    Constitutional: No weight loss, fever or chills Skin: No rash  Cardiovascular: No chest pain   Respiratory: No SOB  Gastrointestinal: See HPI and otherwise negative Genitourinary: No dysuria  Neurological: No headache, dizziness or syncope Musculoskeletal: No new muscle or joint pain Hematologic: No bleeding  Psychiatric: No history of depression or anxiety   Physical Exam:  Vital signs: BP 122/80   Pulse 98   Ht  5\' 4"  (1.626 m)   Wt 153 lb (69.4 kg)   BMI 26.26 kg/m   Constitutional:   Pleasant Caucasian female appears to be in NAD, Well developed, Well nourished, alert and cooperative Head:  Normocephalic and atraumatic. Eyes:   PEERL, EOMI. No icterus. Conjunctiva pink. Ears:  Normal auditory acuity. Neck:  Supple Throat: Oral cavity and pharynx without inflammation, swelling or lesion.  Respiratory: Respirations even and unlabored. Lungs clear to auscultation bilaterally.   No wheezes, crackles, or rhonchi.  Cardiovascular: Normal S1, S2. No MRG. Regular rate and rhythm. No peripheral edema, cyanosis or pallor.  Gastrointestinal:  Soft, nondistended, nontender. No rebound or guarding. Normal bowel sounds. No appreciable masses or hepatomegaly. Rectal:  Not performed.  Msk:  Symmetrical without gross deformities. Without edema, no deformity or joint abnormality.  Neurologic:  Alert and  oriented x4;  grossly normal neurologically.  Skin:   Dry and intact without significant lesions or rashes. Psychiatric: Demonstrates good judgement and reason without abnormal affect or behaviors.  RELEVANT LABS AND IMAGING: CBC    Component Value Date/Time   WBC 5.9 11/18/2022 1023   RBC 4.48 11/18/2022 1023   HGB 13.0 11/18/2022 1023   HCT 39.9 11/18/2022 1023   PLT 258 11/18/2022 1023   MCV 89.1 11/18/2022 1023   MCH 29.0 11/18/2022 1023   MCHC 32.6 11/18/2022 1023   RDW 11.8 11/18/2022 1023   LYMPHSABS 2.0 11/18/2022 1023   MONOABS 0.4  11/18/2022 1023   EOSABS 0.3 11/18/2022 1023   BASOSABS 0.1 11/18/2022 1023    CMP     Component Value Date/Time   NA 138 09/23/2021 0929   K 4.0 09/23/2021 0929   CL 103 09/23/2021 0929   CO2 28 09/23/2021 0929   GLUCOSE 93 09/23/2021 0929   BUN 10 09/23/2021 0929   CREATININE 0.75 09/23/2021 0929   CALCIUM 8.8 09/23/2021 0929   PROT 6.7 09/23/2021 0929   ALBUMIN 4.3 09/23/2021 0929   AST 16 09/23/2021 0929   ALT 8 09/23/2021 0929   ALKPHOS 59 09/23/2021 0929   BILITOT 0.6 09/23/2021 0929    Assessment: 1.  B12 deficiency: Question of atrophic gastritis by heme/oncology, positive intrinsic factor antibody  Plan: 1.  Schedule patient for diagnostic EGD in the [facility] with Dr. Rhea Belton.  Did provide the patient in detail this risk of procedure and she agrees to proceed. Patient is appropriate for endoscopic procedure(s) in the ambulatory ([facility]) setting.  2.  Patient to continue to follow with heme-onc in regards to her B12 deficiency 3.  Patient to follow in clinic per recommendations from Dr. Rhea Belton after time of procedure.  Hyacinth Meeker, PA-C [facility] 01/30/2023, 10:05 AM  Cc: Sherlene Shams, MD

## 2023-01-30 NOTE — Progress Notes (Signed)
Addendum: Reviewed and agree with assessment and management plan. Kadijah Shamoon M, MD  

## 2023-01-30 NOTE — Patient Instructions (Signed)
You have been scheduled for an endoscopy. Please follow written instructions given to you at your visit today.  If you use inhalers (even only as needed), please bring them with you on the day of your procedure.  If you take any of the following medications, they will need to be adjusted prior to your procedure:   DO NOT TAKE 7 DAYS PRIOR TO TEST- Trulicity (dulaglutide) Ozempic, Wegovy (semaglutide) Mounjaro (tirzepatide) Bydureon Bcise (exanatide extended release)  DO NOT TAKE 1 DAY PRIOR TO YOUR TEST Rybelsus (semaglutide) Adlyxin (lixisenatide) Victoza (liraglutide) Byetta (exanatide) __________________________________________________________________  _______________________________________________________  If your blood pressure at your visit was 140/90 or greater, please contact your primary care physician to follow up on this.  _______________________________________________________  If you are age 103 or older, your body mass index should be between 23-30. Your Body mass index is 26.26 kg/m. If this is out of the aforementioned range listed, please consider follow up with your Primary Care Provider.  If you are age 39 or younger, your body mass index should be between 19-25. Your Body mass index is 26.26 kg/m. If this is out of the aformentioned range listed, please consider follow up with your Primary Care Provider.   ________________________________________________________  The [facility] providers would like to encourage you to use [facility] to communicate with providers for non-urgent requests or questions.  Due to long hold times on the telephone, sending your provider a message by [facility] may be a faster and more efficient way to get a response.  Please allow 48 business hours for a response.  Please remember that this is for non-urgent requests.  _______________________________________________________

## 2023-02-17 ENCOUNTER — Inpatient Hospital Stay: Payer: 59 | Attending: Oncology

## 2023-02-17 DIAGNOSIS — E538 Deficiency of other specified B group vitamins: Secondary | ICD-10-CM

## 2023-02-17 MED ORDER — CYANOCOBALAMIN 1000 MCG/ML IJ SOLN
1000.0000 ug | Freq: Once | INTRAMUSCULAR | Status: DC
  Filled 2023-02-17: qty 1

## 2023-02-19 ENCOUNTER — Other Ambulatory Visit: Payer: Self-pay | Admitting: Internal Medicine

## 2023-02-19 DIAGNOSIS — F902 Attention-deficit hyperactivity disorder, combined type: Secondary | ICD-10-CM

## 2023-02-20 MED ORDER — METHYLPHENIDATE HCL 20 MG PO TABS: 20.0000 mg | ORAL_TABLET | Freq: Two times a day (BID) | ORAL | 0 refills | Status: DC

## 2023-02-20 MED ORDER — METHYLPHENIDATE HCL 5 MG PO TABS: 5.0000 mg | ORAL_TABLET | Freq: Two times a day (BID) | ORAL | 0 refills | Status: DC

## 2023-02-21 ENCOUNTER — Inpatient Hospital Stay: Payer: 59

## 2023-03-21 ENCOUNTER — Encounter: Payer: Self-pay | Admitting: Internal Medicine

## 2023-03-21 ENCOUNTER — Inpatient Hospital Stay: Payer: 59 | Attending: Oncology

## 2023-03-21 DIAGNOSIS — E538 Deficiency of other specified B group vitamins: Secondary | ICD-10-CM

## 2023-03-21 MED ORDER — CYANOCOBALAMIN 1000 MCG/ML IJ SOLN
1000.0000 ug | Freq: Once | INTRAMUSCULAR | Status: AC
  Administered 2023-03-21: 1000 ug via INTRAMUSCULAR
  Filled 2023-03-21: qty 1

## 2023-03-24 ENCOUNTER — Inpatient Hospital Stay: Payer: 59

## 2023-03-29 ENCOUNTER — Encounter: Payer: 59 | Admitting: Internal Medicine

## 2023-04-04 ENCOUNTER — Encounter: Payer: Self-pay | Admitting: Internal Medicine

## 2023-04-04 ENCOUNTER — Telehealth: Payer: 59 | Admitting: Internal Medicine

## 2023-04-04 VITALS — Ht 64.0 in | Wt 153.0 lb

## 2023-04-04 DIAGNOSIS — F988 Other specified behavioral and emotional disorders with onset usually occurring in childhood and adolescence: Secondary | ICD-10-CM | POA: Diagnosis not present

## 2023-04-04 DIAGNOSIS — F902 Attention-deficit hyperactivity disorder, combined type: Secondary | ICD-10-CM | POA: Diagnosis not present

## 2023-04-04 DIAGNOSIS — E538 Deficiency of other specified B group vitamins: Secondary | ICD-10-CM

## 2023-04-04 MED ORDER — METHYLPHENIDATE HCL 20 MG PO TABS: 20.0000 mg | ORAL_TABLET | Freq: Two times a day (BID) | ORAL | 0 refills | Status: DC

## 2023-04-04 MED ORDER — METHYLPHENIDATE HCL 5 MG PO TABS
5.0000 mg | ORAL_TABLET | Freq: Two times a day (BID) | ORAL | 0 refills | Status: DC
Start: 2023-04-04 — End: 2023-07-10

## 2023-04-04 MED ORDER — METHYLPHENIDATE HCL 5 MG PO TABS
5.0000 mg | ORAL_TABLET | Freq: Two times a day (BID) | ORAL | 0 refills | Status: DC
Start: 2023-04-04 — End: 2023-07-07

## 2023-04-04 MED ORDER — METHYLPHENIDATE HCL 5 MG PO TABS
5.0000 mg | ORAL_TABLET | Freq: Two times a day (BID) | ORAL | 0 refills | Status: DC
Start: 2023-04-04 — End: 2023-12-01

## 2023-04-04 NOTE — Assessment & Plan Note (Signed)
Patient is functioning  well  without side effects on current dose of  25 mg  methylphenidate  bid

## 2023-04-04 NOTE — Assessment & Plan Note (Signed)
B12 level has improved but still low by July recheck (heme).  She Has been referred to GI. EGD next week.  Needs RN visit for self adminstration instruction..  currently on monthly injections  Lab Results  Component Value Date   VITAMINB12 186 11/18/2022

## 2023-04-04 NOTE — Progress Notes (Signed)
Virtual Visit via Caregility   Note   This format is felt to be most appropriate for this patient at this time.  All issues noted in this document were discussed and addressed.  No physical exam was performed (except for noted visual exam findings with Video Visits).   I connected with  Crystal Reyes  on 04/04/23 at 11:00 AM EST by a video enabled telemedicine application  and verified that I am speaking with the correct person using two identifiers. Location patient: home Location provider: work or home office Persons participating in the virtual visit: patient, provider  I discussed the limitations, risks, security and privacy concerns of performing an evaluation and management service by telephone and the availability of in person appointments. I also discussed with the patient that there may be a patient responsible charge related to this service. The patient expressed understanding and agreed to proceed.  Reason for visit: medication refill for ADD meds   HPI:  1) B12 deficiend IF antibody positive,  antiparietal cell ab negative.  Receiving parental therapy monthly since July   2) ADD:  using ritalin 25 mg bid . Refills needed   3) basal cell CA reet skin biopsy  4) FH  of AD (mother) discussed referral to Dr Sherryll Burger to discuss lifestyle modifications for prevention    ROS: See pertinent positives and negatives per HPI.  Past Medical History:  Diagnosis Date   Abnormal Pap smear of cervix 2010   normal biopsy, followup with GYN   Anemia    Attention deficit disorder of adult without mention of hyperactivity    managed with methylphenidate   B12 deficiency    Basal cell carcinoma    Chronic back pain    COVID-19 virus infection 07/09/2020   Headache     Past Surgical History:  Procedure Laterality Date   BUNIONECTOMY     COLPOSCOPY      Family History  Problem Relation Age of Onset   Hypertension Mother    Hyperlipidemia Mother    Colon polyps Father 22   Hyperlipidemia  Father    Hypertension Father    Colon polyps Sister 63   Heart disease Maternal Grandfather    Cancer Paternal Grandmother 19       melanoma   Colon cancer Paternal Grandfather    Breast cancer Cousin        2nd cousin    SOCIAL HX:  reports that she has never smoked. She has never used smokeless tobacco. She reports current alcohol use. She reports that she does not use drugs.    Current Outpatient Medications:    cyanocobalamin (VITAMIN B12) 1000 MCG/ML injection, Inject 1,000 mcg into the muscle once., Disp: , Rfl:    hydroquinone 4 % cream, Apply topically at bedtime., Disp: , Rfl:    meloxicam (MOBIC) 7.5 MG tablet, TAKE 1 TABLET BY MOUTH EVERY DAY AS NEEDED FOR PAIN, Disp: 30 tablet, Rfl: 1   Multiple Vitamin (MULTIVITAMIN) tablet, Take 1 tablet by mouth daily., Disp: , Rfl:    OVER THE COUNTER MEDICATION, Ptt aking lions manes powder, Disp: , Rfl:    Red Yeast Rice Extract (RED YEAST RICE PO), Take by mouth., Disp: , Rfl:    Syringe/Needle, Disp, (SYRINGE 3CC/25GX1") 25G X 1" 3 ML MISC, Use to give b12 injection once monthly, Disp: 3 each, Rfl: 3   acetaminophen (TYLENOL) 325 MG tablet, Take 650 mg by mouth every 6 (six) hours as needed. (Patient not taking: Reported on 04/04/2023), Disp: ,  Rfl:    methylphenidate (RITALIN) 20 MG tablet, Take 1 tablet (20 mg total) by mouth 2 (two) times daily with breakfast and lunch., Disp: 60 tablet, Rfl: 0   methylphenidate (RITALIN) 20 MG tablet, Take 1 tablet (20 mg total) by mouth 2 (two) times daily with breakfast and lunch. To complete current mont prescripton, Disp: 15 tablet, Rfl: 0   methylphenidate (RITALIN) 20 MG tablet, Take 1 tablet (20 mg total) by mouth 2 (two) times daily with breakfast and lunch., Disp: 60 tablet, Rfl: 0   methylphenidate (RITALIN) 20 MG tablet, Take 1 tablet (20 mg total) by mouth 2 (two) times daily with breakfast and lunch., Disp: 60 tablet, Rfl: 0   methylphenidate (RITALIN) 5 MG tablet, Take 1 tablet (5 mg  total) by mouth 2 (two) times daily. TAKE WITH 20 MG DOSE TWICE Daily, Disp: 60 tablet, Rfl: 0   methylphenidate (RITALIN) 5 MG tablet, Take 1 tablet (5 mg total) by mouth 2 (two) times daily. TAKE WITH 20 MG DOSE TWICE Daily, Disp: 60 tablet, Rfl: 0   methylphenidate (RITALIN) 5 MG tablet, Take 1 tablet (5 mg total) by mouth 2 (two) times daily. TAKE WITH 20 MG DOSE TWICE Daily, Disp: 60 tablet, Rfl: 0  EXAM:  VITALS per patient if applicable:  GENERAL: alert, oriented, appears well and in no acute distress  HEENT: atraumatic, conjunttiva clear, no obvious abnormalities on inspection of external nose and ears  NECK: normal movements of the head and neck  LUNGS: on inspection no signs of respiratory distress, breathing rate appears normal, no obvious gross SOB, gasping or wheezing  CV: no obvious cyanosis  MS: moves all visible extremities without noticeable abnormality  PSYCH/NEURO: pleasant and cooperative, no obvious depression or anxiety, speech and thought processing grossly intact  ASSESSMENT AND PLAN: B12 deficiency Assessment & Plan: B12 level has improved but still low by July recheck (heme).  She Has been referred to GI. EGD next week.  Needs RN visit for self adminstration instruction..  currently on monthly injections  Lab Results  Component Value Date   VITAMINB12 186 11/18/2022      Attention deficit hyperactivity disorder (ADHD), combined type -     Methylphenidate HCl; Take 1 tablet (5 mg total) by mouth 2 (two) times daily. TAKE WITH 20 MG DOSE TWICE Daily  Dispense: 60 tablet; Refill: 0 -     Methylphenidate HCl; Take 1 tablet (5 mg total) by mouth 2 (two) times daily. TAKE WITH 20 MG DOSE TWICE Daily  Dispense: 60 tablet; Refill: 0 -     Methylphenidate HCl; Take 1 tablet (5 mg total) by mouth 2 (two) times daily. TAKE WITH 20 MG DOSE TWICE Daily  Dispense: 60 tablet; Refill: 0  Adult attention deficit disorder Assessment & Plan: Patient is functioning   well  without side effects on current dose of  25 mg  methylphenidate  bid    Other orders -     Methylphenidate HCl; Take 1 tablet (20 mg total) by mouth 2 (two) times daily with breakfast and lunch. To complete current mont prescripton  Dispense: 15 tablet; Refill: 0 -     Methylphenidate HCl; Take 1 tablet (20 mg total) by mouth 2 (two) times daily with breakfast and lunch.  Dispense: 60 tablet; Refill: 0 -     Methylphenidate HCl; Take 1 tablet (20 mg total) by mouth 2 (two) times daily with breakfast and lunch.  Dispense: 60 tablet; Refill: 0  I discussed the assessment and treatment plan with the patient. The patient was provided an opportunity to ask questions and all were answered. The patient agreed with the plan and demonstrated an understanding of the instructions.   The patient was advised to call back or seek an in-person evaluation if the symptoms worsen or if the condition fails to improve as anticipated.   I spent 20 minutes dedicated to the care of this patient on the date of this encounter to include pre-visit review of her medical history,  Face-to-face time with the patient , and post visit ordering of testing and therapeutics.    Sherlene Shams, MD

## 2023-04-10 ENCOUNTER — Other Ambulatory Visit (INDEPENDENT_AMBULATORY_CARE_PROVIDER_SITE_OTHER): Payer: 59

## 2023-04-10 ENCOUNTER — Encounter: Payer: Self-pay | Admitting: Internal Medicine

## 2023-04-10 ENCOUNTER — Ambulatory Visit: Payer: 59 | Admitting: Internal Medicine

## 2023-04-10 VITALS — BP 103/82 | HR 72 | Temp 97.9°F | Resp 11 | Ht 64.0 in | Wt 153.0 lb

## 2023-04-10 DIAGNOSIS — E538 Deficiency of other specified B group vitamins: Secondary | ICD-10-CM

## 2023-04-10 DIAGNOSIS — D510 Vitamin B12 deficiency anemia due to intrinsic factor deficiency: Secondary | ICD-10-CM | POA: Diagnosis present

## 2023-04-10 LAB — VITAMIN B12: Vitamin B-12: 283 pg/mL (ref 211–911)

## 2023-04-10 MED ORDER — SODIUM CHLORIDE 0.9 % IV SOLN: 500.0000 mL | Freq: Once | INTRAVENOUS | Status: DC

## 2023-04-10 NOTE — Progress Notes (Signed)
Vss nad trans to pacu 

## 2023-04-10 NOTE — Op Note (Signed)
[facility] Patient Name: Crystal Reyes Procedure Date: 04/10/2023 10:06 AM MRN: [id number] Endoscopist: Beverley Fiedler , MD, [id number] Age: 52 Referring MD:  Date of Birth: 11-Mar-1971 Gender: Female Account #: [id number] Procedure:                Upper GI endoscopy Indications:              Pernicious anemia; initially B12 IM every 1 week,                            now every 4 weeks Medicines:                Monitored Anesthesia Care Procedure:                Pre-Anesthesia Assessment:                           - Prior to the procedure, a History and Physical                            was performed, and patient medications and                            allergies were reviewed. The patient's tolerance of                            previous anesthesia was also reviewed. The risks                            and benefits of the procedure and the sedation                            options and risks were discussed with the patient.                            All questions were answered, and informed consent                            was obtained. Prior Anticoagulants: The patient has                            taken no anticoagulant or antiplatelet agents. ASA                            Grade Assessment: II - A patient with mild systemic                            disease. After reviewing the risks and benefits,                            the patient was deemed in satisfactory condition to                            undergo the procedure.  After obtaining informed consent, the endoscope was                            passed under direct vision. Throughout the                            procedure, the patient's blood pressure, pulse, and                            oxygen saturations were monitored continuously. The                            Olympus Scope [id number] was introduced through the                            mouth, and advanced to  the second part of duodenum.                            The upper GI endoscopy was accomplished without                            difficulty. The patient tolerated the procedure                            well. Scope In: Scope Out: Findings:                 The examined esophagus was normal.                           The entire examined stomach was normal. Multiple                            biopsies were obtained with cold forceps for                            histology in the cardia/gastric fundus (Jar 3), in                            the gastric body (Jar 2), at the incisura and in                            the gastric antrum (Jar 1).                           The examined duodenum was normal. Complications:            No immediate complications. Estimated Blood Loss:     Estimated blood loss was minimal. Impression:               - Normal esophagus.                           - Normal stomach.                           -  Normal examined duodenum.                           - Multiple biopsies were obtained in the cardia, in                            the gastric fundus, in the gastric body, at the                            incisura and in the gastric antrum to exclude                            atropic gastritis and H. Pylori infection. Recommendation:           - Patient has a contact number available for                            emergencies. The signs and symptoms of potential                            delayed complications were discussed with the                            patient. Return to normal activities tomorrow.                            Written discharge instructions were provided to the                            patient.                           - Resume previous diet.                           - Continue present medications.                           - Await pathology results.                           - Serum B12 today.                           - Repeat  upper endoscopy for surveillance based on                            pathology results. Beverley Fiedler, MD 04/10/2023 10:23:19 AM This report has been signed electronically.

## 2023-04-10 NOTE — Progress Notes (Signed)
GASTROENTEROLOGY PROCEDURE H&P NOTE   Primary Care Physician: Sherlene Shams, MD    Reason for Procedure:  Pernicious anemia with B12 deficiency  Plan:    EGD  Patient is appropriate for endoscopic procedure(s) in the ambulatory ([facility]) setting.  The nature of the procedure, as well as the risks, benefits, and alternatives were carefully and thoroughly reviewed with the patient. Ample time for discussion and questions allowed. The patient understood, was satisfied, and agreed to proceed.     HPI: Crystal Reyes is a 52 y.o. female who presents for EGD.  Medical history as below.  No recent chest pain or shortness of breath.  No abdominal pain today.  Past Medical History:  Diagnosis Date   Abnormal Pap smear of cervix 2010   normal biopsy, followup with GYN   Anemia    Attention deficit disorder of adult without mention of hyperactivity    managed with methylphenidate   B12 deficiency    Basal cell carcinoma    Chronic back pain    COVID-19 virus infection 07/09/2020   Headache     Past Surgical History:  Procedure Laterality Date   BUNIONECTOMY     COLPOSCOPY      Prior to Admission medications   Medication Sig Start Date End Date Taking? Authorizing Provider  methylphenidate (RITALIN) 20 MG tablet Take 1 tablet (20 mg total) by mouth 2 (two) times daily with breakfast and lunch. To complete current mont prescripton 04/04/23  Yes Sherlene Shams, MD  methylphenidate (RITALIN) 20 MG tablet Take 1 tablet (20 mg total) by mouth 2 (two) times daily with breakfast and lunch. 04/04/23 06/03/23 Yes Sherlene Shams, MD  methylphenidate (RITALIN) 20 MG tablet Take 1 tablet (20 mg total) by mouth 2 (two) times daily with breakfast and lunch. 04/04/23 05/04/23 Yes Sherlene Shams, MD  methylphenidate (RITALIN) 5 MG tablet Take 1 tablet (5 mg total) by mouth 2 (two) times daily. TAKE WITH 20 MG DOSE TWICE Daily 04/04/23  Yes Sherlene Shams, MD  methylphenidate  (RITALIN) 5 MG tablet Take 1 tablet (5 mg total) by mouth 2 (two) times daily. TAKE WITH 20 MG DOSE TWICE Daily 04/04/23  Yes Sherlene Shams, MD  methylphenidate (RITALIN) 5 MG tablet Take 1 tablet (5 mg total) by mouth 2 (two) times daily. TAKE WITH 20 MG DOSE TWICE Daily 04/04/23  Yes Sherlene Shams, MD  OVER THE COUNTER MEDICATION Ptt aking lions manes powder   Yes [provider]  acetaminophen (TYLENOL) 325 MG tablet Take 650 mg by mouth every 6 (six) hours as needed. Patient not taking: Reported on 04/04/2023    [provider]  cyanocobalamin (VITAMIN B12) 1000 MCG/ML injection Inject 1,000 mcg into the muscle once. 11/08/22   [provider]  hydroquinone 4 % cream Apply topically at bedtime. 03/04/23   [provider]  meloxicam (MOBIC) 7.5 MG tablet TAKE 1 TABLET BY MOUTH EVERY DAY AS NEEDED FOR PAIN 08/01/22   Sherlene Shams, MD  methylphenidate (RITALIN) 20 MG tablet Take 1 tablet (20 mg total) by mouth 2 (two) times daily with breakfast and lunch. 02/20/23 03/22/23  Sherlene Shams, MD  Multiple Vitamin (MULTIVITAMIN) tablet Take 1 tablet by mouth daily.    [provider]  Red Yeast Rice Extract (RED YEAST RICE PO) Take by mouth.    [provider]  Syringe/Needle, Disp, (SYRINGE 3CC/25GX1") 25G X 1" 3 ML MISC Use to give b12 injection once  monthly 11/07/22   Sherlene Shams, MD    Current Outpatient Medications  Medication Sig Dispense Refill   methylphenidate (RITALIN) 20 MG tablet Take 1 tablet (20 mg total) by mouth 2 (two) times daily with breakfast and lunch. To complete current mont prescripton 15 tablet 0   methylphenidate (RITALIN) 20 MG tablet Take 1 tablet (20 mg total) by mouth 2 (two) times daily with breakfast and lunch. 60 tablet 0   methylphenidate (RITALIN) 20 MG tablet Take 1 tablet (20 mg total) by mouth 2 (two) times daily with breakfast and lunch. 60 tablet 0   methylphenidate (RITALIN) 5 MG tablet Take 1 tablet  (5 mg total) by mouth 2 (two) times daily. TAKE WITH 20 MG DOSE TWICE Daily 60 tablet 0   methylphenidate (RITALIN) 5 MG tablet Take 1 tablet (5 mg total) by mouth 2 (two) times daily. TAKE WITH 20 MG DOSE TWICE Daily 60 tablet 0   methylphenidate (RITALIN) 5 MG tablet Take 1 tablet (5 mg total) by mouth 2 (two) times daily. TAKE WITH 20 MG DOSE TWICE Daily 60 tablet 0   OVER THE COUNTER MEDICATION Ptt aking lions manes powder     acetaminophen (TYLENOL) 325 MG tablet Take 650 mg by mouth every 6 (six) hours as needed. (Patient not taking: Reported on 04/04/2023)     cyanocobalamin (VITAMIN B12) 1000 MCG/ML injection Inject 1,000 mcg into the muscle once.     hydroquinone 4 % cream Apply topically at bedtime.     meloxicam (MOBIC) 7.5 MG tablet TAKE 1 TABLET BY MOUTH EVERY DAY AS NEEDED FOR PAIN 30 tablet 1   methylphenidate (RITALIN) 20 MG tablet Take 1 tablet (20 mg total) by mouth 2 (two) times daily with breakfast and lunch. 60 tablet 0   Multiple Vitamin (MULTIVITAMIN) tablet Take 1 tablet by mouth daily.     Red Yeast Rice Extract (RED YEAST RICE PO) Take by mouth.     Syringe/Needle, Disp, (SYRINGE 3CC/25GX1") 25G X 1" 3 ML MISC Use to give b12 injection once monthly 3 each 3   Current Facility-Administered Medications  Medication Dose Route Frequency Provider Last Rate Last Admin   0.9 %  sodium chloride infusion  500 mL Intravenous Once Sande Pickert, Carie Caddy, MD        Allergies as of 04/10/2023 - Review Complete 04/10/2023  Allergen Reaction Noted   Codeine  02/09/2011    Family History  Problem Relation Age of Onset   Hypertension Mother    Hyperlipidemia Mother    Colon polyps Father 51   Hyperlipidemia Father    Hypertension Father    Colon polyps Sister 9   Heart disease Maternal Grandfather    Cancer Paternal Grandmother 13       melanoma   Colon cancer Paternal Grandfather    Breast cancer Cousin        2nd cousin    Social History   Socioeconomic History   Marital  status: Married    Spouse name: Not on file   Number of children: Not on file   Years of education: Not on file   Highest education level: Professional school degree (e.g., MD, DDS, DVM, JD)  Occupational History   Occupation: attorney    Employer: [employer]  Tobacco Use   Smoking status: Never   Smokeless tobacco: Never  Vaping Use   Vaping status: Never Used  Substance and Sexual Activity   Alcohol use: Yes    Comment: wine occasional  Drug use: No   Sexual activity: Yes    Birth control/protection: None  Other Topics Concern   Not on file  Social History Narrative   Not on file   Social Determinants of Health   Financial Resource Strain: Low Risk  (09/26/2022)   Overall Financial Resource Strain (CARDIA)    Difficulty of Paying Living Expenses: Not very hard  Food Insecurity: No Food Insecurity (09/26/2022)   Hunger Vital Sign    Worried About Running Out of Food in the Last Year: Never true    Ran Out of Food in the Last Year: Never true  Transportation Needs: No Transportation Needs (09/26/2022)   PRAPARE - Administrator, Civil Service (Medical): No    Lack of Transportation (Non-Medical): No  Physical Activity: Insufficiently Active (09/26/2022)   Exercise Vital Sign    Days of Exercise per Week: 1 day    Minutes of Exercise per Session: 30 min  Stress: No Stress Concern Present (09/26/2022)   [employer] of Occupational Health - Occupational Stress Questionnaire    Feeling of Stress : Only a little  Social Connections: Socially Integrated (09/26/2022)   Social Connection and Isolation Panel [NHANES]    Frequency of Communication with Friends and Family: More than three times a week    Frequency of Social Gatherings with Friends and Family: Once a week    Attends Religious Services: 1 to 4 times per year    Active Member of [employer] or Organizations: Yes    Attends Engineer, structural: More than 4 times per year    Marital Status:  Married  Catering manager Violence: Not on file    Physical Exam: Vital signs in last 24 hours: @BP  132/83   Pulse 73   Temp 97.9 F (36.6 C)   Ht 5\' 4"  (1.626 m)   Wt 153 lb (69.4 kg)   LMP 04/03/2023 (Exact Date)   SpO2 99%   BMI 26.26 kg/m  GEN: NAD EYE: Sclerae anicteric ENT: MMM CV: Non-tachycardic Pulm: CTA b/l GI: Soft, NT/ND NEURO:  Alert & Oriented x 3   Erick Blinks, MD [facility]  04/10/2023 10:02 AM

## 2023-04-10 NOTE — Progress Notes (Signed)
Called to room to assist during endoscopic procedure.  Patient ID and intended procedure confirmed with present staff. Received instructions for my participation in the procedure from the performing physician.  

## 2023-04-10 NOTE — Patient Instructions (Signed)
Thank you for letting us take care of your healthcare needs today.     YOU HAD AN ENDOSCOPIC PROCEDURE TODAY AT [facility]:   Refer to the procedure report that was given to you for any specific questions about what was found during the examination.  If the procedure report does not answer your questions, please call your gastroenterologist to clarify.  If you requested that your care partner not be given the details of your procedure findings, then the procedure report has been included in a sealed envelope for you to review at your convenience later.  YOU SHOULD EXPECT: Some feelings of bloating in the abdomen. Passage of more gas than usual.  Walking can help get rid of the air that was put into your GI tract during the procedure and reduce the bloating. If you had a lower endoscopy (such as a colonoscopy or flexible sigmoidoscopy) you may notice spotting of blood in your stool or on the toilet paper. If you underwent a bowel prep for your procedure, you may not have a normal bowel movement for a few days.  Please Note:  You might notice some irritation and congestion in your nose or some drainage.  This is from the oxygen used during your procedure.  There is no need for concern and it should clear up in a day or so.  SYMPTOMS TO REPORT IMMEDIATELY:   Following upper endoscopy (EGD)  Vomiting of blood or coffee ground material  New chest pain or pain under the shoulder blades  Painful or persistently difficult swallowing  New shortness of breath  Fever of 100F or higher  Black, tarry-looking stools  For urgent or emergent issues, a gastroenterologist can be reached at any hour by calling [phone]. Do not use MyChart messaging for urgent concerns.    DIET:  We do recommend a small meal at first, but then you may proceed to your regular diet.  Drink plenty of fluids but you should avoid alcoholic beverages for 24 hours.  ACTIVITY:  You should plan to take it  easy for the rest of today and you should NOT DRIVE or use heavy machinery until tomorrow (because of the sedation medicines used during the test).    FOLLOW UP: Our staff will call the number listed on your records the next business day following your procedure.  We will call around 7:15- 8:00 am to check on you and address any questions or concerns that you may have regarding the information given to you following your procedure. If we do not reach you, we will leave a message.     If any biopsies were taken you will be contacted by phone or by letter within the next 1-3 weeks.  Please call us at [phone] if you have not heard about the biopsies in 3 weeks.    SIGNATURES/CONFIDENTIALITY: You and/or your care partner have signed paperwork which will be entered into your electronic medical record.  These signatures attest to the fact that that the information above on your After Visit Summary has been reviewed and is understood.  Full responsibility of the confidentiality of this discharge information lies with you and/or your care-partner.

## 2023-04-11 ENCOUNTER — Telehealth: Payer: Self-pay

## 2023-04-11 NOTE — Telephone Encounter (Signed)
Attempted f/u call. No answer, left VM. 

## 2023-04-12 ENCOUNTER — Other Ambulatory Visit: Payer: Self-pay | Admitting: Internal Medicine

## 2023-04-12 LAB — SURGICAL PATHOLOGY

## 2023-04-13 ENCOUNTER — Other Ambulatory Visit: Payer: Self-pay | Admitting: Internal Medicine

## 2023-04-13 MED ORDER — METHYLPHENIDATE HCL 20 MG PO TABS: 20.0000 mg | ORAL_TABLET | Freq: Two times a day (BID) | ORAL | 0 refills | Status: DC

## 2023-04-13 NOTE — Telephone Encounter (Signed)
Spoke with pt to let her know that I spoke with the pharmacist and he stated that they only filled the rx for 15 tablets because the 1st rx that was sent in was written for 15 tablets and the instructions state to complete the current month. Pharmacist stated that she can have the other rx filled now and that one will be for the 60 tablets.

## 2023-04-14 ENCOUNTER — Encounter: Payer: Self-pay | Admitting: Internal Medicine

## 2023-04-20 ENCOUNTER — Inpatient Hospital Stay: Payer: 59 | Attending: Oncology

## 2023-04-20 ENCOUNTER — Other Ambulatory Visit: Payer: Self-pay | Admitting: Internal Medicine

## 2023-04-20 DIAGNOSIS — F902 Attention-deficit hyperactivity disorder, combined type: Secondary | ICD-10-CM

## 2023-04-20 DIAGNOSIS — E538 Deficiency of other specified B group vitamins: Secondary | ICD-10-CM | POA: Insufficient documentation

## 2023-04-20 MED ORDER — METHYLPHENIDATE HCL 20 MG PO TABS: 20.0000 mg | ORAL_TABLET | Freq: Two times a day (BID) | ORAL | 0 refills | Status: DC

## 2023-04-20 MED ORDER — CYANOCOBALAMIN 1000 MCG/ML IJ SOLN
1000.0000 ug | Freq: Once | INTRAMUSCULAR | Status: AC
  Administered 2023-04-20: 1000 ug via INTRAMUSCULAR
  Filled 2023-04-20: qty 1

## 2023-04-20 NOTE — Telephone Encounter (Signed)
These are duplicate request. Can you refuse this request.

## 2023-04-24 ENCOUNTER — Inpatient Hospital Stay: Payer: 59

## 2023-04-25 ENCOUNTER — Telehealth: Payer: 59 | Admitting: Physician Assistant

## 2023-04-25 DIAGNOSIS — J440 Chronic obstructive pulmonary disease with acute lower respiratory infection: Secondary | ICD-10-CM

## 2023-04-25 DIAGNOSIS — J209 Acute bronchitis, unspecified: Secondary | ICD-10-CM

## 2023-04-25 MED ORDER — AZITHROMYCIN 250 MG PO TABS: ORAL_TABLET | ORAL | 0 refills | Status: AC

## 2023-04-25 MED ORDER — ALBUTEROL SULFATE HFA 108 (90 BASE) MCG/ACT IN AERS: 2.0000 | INHALATION_SPRAY | Freq: Four times a day (QID) | RESPIRATORY_TRACT | 0 refills | Status: DC | PRN

## 2023-04-25 MED ORDER — PROMETHAZINE-DM 6.25-15 MG/5ML PO SYRP: 5.0000 mL | ORAL_SOLUTION | Freq: Four times a day (QID) | ORAL | 0 refills | Status: DC | PRN

## 2023-04-25 NOTE — Progress Notes (Signed)
Virtual Visit Consent   Crystal Reyes, you are scheduled for a virtual visit with a [facility] provider today. Just as with appointments in the office, your consent must be obtained to participate. Your consent will be active for this visit and any virtual visit you may have with one of our providers in the next 365 days. If you have a MyChart account, a copy of this consent can be sent to you electronically.  As this is a virtual visit, video technology does not allow for your provider to perform a traditional examination. This may limit your provider's ability to fully assess your condition. If your provider identifies any concerns that need to be evaluated in person or the need to arrange testing (such as labs, EKG, etc.), we will make arrangements to do so. Although advances in technology are sophisticated, we cannot ensure that it will always work on either your end or our end. If the connection with a video visit is poor, the visit may have to be switched to a telephone visit. With either a video or telephone visit, we are not always able to ensure that we have a secure connection.  By engaging in this virtual visit, you consent to the provision of healthcare and authorize for your insurance to be billed (if applicable) for the services provided during this visit. Depending on your insurance coverage, you may receive a charge related to this service.  I need to obtain your verbal consent now. Are you willing to proceed with your visit today? Crystal Reyes has provided verbal consent on 04/25/2023 for a virtual visit (video or telephone). Crystal Fantasia Ward, PA-C  Date: 04/25/2023 6:37 PM  Virtual Visit via Video Note   I, Crystal Reyes, connected with  Crystal Reyes  ([id number], [birth year]) on 04/25/23 at  6:30 PM EST by a video-enabled telemedicine application and verified that I am speaking with the correct person using two identifiers.  Location: Patient:  Virtual Visit Location Patient: Home Provider: Virtual Visit Location Provider: Home Office   I discussed the limitations of evaluation and management by telemedicine and the availability of in person appointments. The patient expressed understanding and agreed to proceed.    History of Present Illness: Crystal Reyes is a 52 y.o. who identifies as a female who was assigned female at birth, and is being seen today for persistent cough.  Reports cough started about two weeks ago.  Reports she was starting to feel better, but cough became worse over the last few days.  Denies fever, facial pain or pressure.  Reports son had similar sx and treated for possible pneumonia.  She has been using mucinex and tessalon with minimal relief. Crystal Reyes  HPI: HPI  Problems:  Patient Active Problem List   Diagnosis Date Noted   B12 deficiency 09/27/2022   Right low back pain 01/31/2022   Tick bite of scalp 09/25/2021   Overweight (BMI 25.0-29.9) 04/01/2020   COVID-19 vaccine regimen to maintain immunity completed 04/01/2020   Vitamin D deficiency 08/30/2015   Thoracic spondylosis without myelopathy 08/25/2015   Special screening for malignant neoplasms, colon 04/29/2012   Adult attention deficit disorder    Abnormal Pap smear of cervix     Allergies:  Allergies  Allergen Reactions   Codeine     Headache   Medications:  Current Outpatient Medications:    albuterol (VENTOLIN HFA) 108 (90 Base) MCG/ACT inhaler, Inhale 2 puffs into the lungs every 6 (six) hours as needed for  wheezing or shortness of breath., Disp: 8 g, Rfl: 0   azithromycin (ZITHROMAX) 250 MG tablet, Take 2 tablets on day 1, then 1 tablet daily on days 2 through 5, Disp: 6 tablet, Rfl: 0   promethazine-dextromethorphan (PROMETHAZINE-DM) 6.25-15 MG/5ML syrup, Take 5 mLs by mouth 4 (four) times daily as needed for cough., Disp: 118 mL, Rfl: 0   acetaminophen (TYLENOL) 325 MG tablet, Take 650 mg by mouth every 6 (six) hours as needed.  (Patient not taking: Reported on 04/04/2023), Disp: , Rfl:    cyanocobalamin (VITAMIN B12) 1000 MCG/ML injection, Inject 1,000 mcg into the muscle once., Disp: , Rfl:    hydroquinone 4 % cream, Apply topically at bedtime., Disp: , Rfl:    meloxicam (MOBIC) 7.5 MG tablet, TAKE 1 TABLET BY MOUTH EVERY DAY AS NEEDED FOR PAIN, Disp: 30 tablet, Rfl: 1   methylphenidate (RITALIN) 20 MG tablet, Take 1 tablet (20 mg total) by mouth 2 (two) times daily with breakfast and lunch., Disp: 60 tablet, Rfl: 0   methylphenidate (RITALIN) 20 MG tablet, Take 1 tablet (20 mg total) by mouth 2 (two) times daily with breakfast and lunch. To complete current mont prescripton, Disp: 15 tablet, Rfl: 0   methylphenidate (RITALIN) 20 MG tablet, Take 1 tablet (20 mg total) by mouth 2 (two) times daily with breakfast and lunch., Disp: 60 tablet, Rfl: 0   methylphenidate (RITALIN) 20 MG tablet, Take 1 tablet (20 mg total) by mouth 2 (two) times daily with breakfast and lunch. With 5 mg tablet, Disp: 60 tablet, Rfl: 0   methylphenidate (RITALIN) 20 MG tablet, Take 1 tablet (20 mg total) by mouth 2 (two) times daily with breakfast and lunch., Disp: 60 tablet, Rfl: 0   methylphenidate (RITALIN) 5 MG tablet, Take 1 tablet (5 mg total) by mouth 2 (two) times daily. TAKE WITH 20 MG DOSE TWICE Daily, Disp: 60 tablet, Rfl: 0   methylphenidate (RITALIN) 5 MG tablet, Take 1 tablet (5 mg total) by mouth 2 (two) times daily. TAKE WITH 20 MG DOSE TWICE Daily, Disp: 60 tablet, Rfl: 0   methylphenidate (RITALIN) 5 MG tablet, Take 1 tablet (5 mg total) by mouth 2 (two) times daily. TAKE WITH 20 MG DOSE TWICE Daily, Disp: 60 tablet, Rfl: 0   Multiple Vitamin (MULTIVITAMIN) tablet, Take 1 tablet by mouth daily., Disp: , Rfl:    OVER THE COUNTER MEDICATION, Ptt aking lions manes powder, Disp: , Rfl:    Red Yeast Rice Extract (RED YEAST RICE PO), Take by mouth., Disp: , Rfl:    Syringe/Needle, Disp, (SYRINGE 3CC/25GX1") 25G X 1" 3 ML MISC, Use to  give b12 injection once monthly, Disp: 3 each, Rfl: 3  Observations/Objective: Patient is well-developed, well-nourished in no acute distress.  Resting comfortably  at home.  Head is normocephalic, atraumatic.  No labored breathing.  Speech is clear and coherent with logical content.  Patient is alert and oriented at baseline.    Assessment and Plan: 1. Acute bronchitis with COPD (HCC) (Primary) - albuterol (VENTOLIN HFA) 108 (90 Base) MCG/ACT inhaler; Inhale 2 puffs into the lungs every 6 (six) hours as needed for wheezing or shortness of breath.  Dispense: 8 g; Refill: 0 - promethazine-dextromethorphan (PROMETHAZINE-DM) 6.25-15 MG/5ML syrup; Take 5 mLs by mouth 4 (four) times daily as needed for cough.  Dispense: 118 mL; Refill: 0 - azithromycin (ZITHROMAX) 250 MG tablet; Take 2 tablets on day 1, then 1 tablet daily on days 2 through 5  Dispense: 6 tablet;  Refill: 0  Supportive care discussed.  In person evaluation precautions discussed.   Follow Up Instructions: I discussed the assessment and treatment plan with the patient. The patient was provided an opportunity to ask questions and all were answered. The patient agreed with the plan and demonstrated an understanding of the instructions.  A copy of instructions were sent to the patient via MyChart unless otherwise noted below.     The patient was advised to call back or seek an in-person evaluation if the symptoms worsen or if the condition fails to improve as anticipated.    Crystal Fantasia Ward, PA-C

## 2023-04-25 NOTE — Patient Instructions (Signed)
Ulla Potash, thank you for joining Tylene Fantasia Ward, PA-C for today's virtual visit.  While this provider is not your primary care provider (PCP), if your PCP is located in our provider database this encounter information will be shared with them immediately following your visit.   [facility] MyChart account gives you access to today's visit and all your visits, tests, and labs performed at [facility] " click here if you don't have a [facility] MyChart account or go to [email or web address]  Consent: (Patient) Crystal Reyes provided verbal consent for this virtual visit at the beginning of the encounter.  Current Medications:  Current Outpatient Medications:    albuterol (VENTOLIN HFA) 108 (90 Base) MCG/ACT inhaler, Inhale 2 puffs into the lungs every 6 (six) hours as needed for wheezing or shortness of breath., Disp: 8 g, Rfl: 0   azithromycin (ZITHROMAX) 250 MG tablet, Take 2 tablets on day 1, then 1 tablet daily on days 2 through 5, Disp: 6 tablet, Rfl: 0   promethazine-dextromethorphan (PROMETHAZINE-DM) 6.25-15 MG/5ML syrup, Take 5 mLs by mouth 4 (four) times daily as needed for cough., Disp: 118 mL, Rfl: 0   acetaminophen (TYLENOL) 325 MG tablet, Take 650 mg by mouth every 6 (six) hours as needed. (Patient not taking: Reported on 04/04/2023), Disp: , Rfl:    cyanocobalamin (VITAMIN B12) 1000 MCG/ML injection, Inject 1,000 mcg into the muscle once., Disp: , Rfl:    hydroquinone 4 % cream, Apply topically at bedtime., Disp: , Rfl:    meloxicam (MOBIC) 7.5 MG tablet, TAKE 1 TABLET BY MOUTH EVERY DAY AS NEEDED FOR PAIN, Disp: 30 tablet, Rfl: 1   methylphenidate (RITALIN) 20 MG tablet, Take 1 tablet (20 mg total) by mouth 2 (two) times daily with breakfast and lunch., Disp: 60 tablet, Rfl: 0   methylphenidate (RITALIN) 20 MG tablet, Take 1 tablet (20 mg total) by mouth 2 (two) times daily with breakfast and lunch. To complete current mont prescripton,  Disp: 15 tablet, Rfl: 0   methylphenidate (RITALIN) 20 MG tablet, Take 1 tablet (20 mg total) by mouth 2 (two) times daily with breakfast and lunch., Disp: 60 tablet, Rfl: 0   methylphenidate (RITALIN) 20 MG tablet, Take 1 tablet (20 mg total) by mouth 2 (two) times daily with breakfast and lunch. With 5 mg tablet, Disp: 60 tablet, Rfl: 0   methylphenidate (RITALIN) 20 MG tablet, Take 1 tablet (20 mg total) by mouth 2 (two) times daily with breakfast and lunch., Disp: 60 tablet, Rfl: 0   methylphenidate (RITALIN) 5 MG tablet, Take 1 tablet (5 mg total) by mouth 2 (two) times daily. TAKE WITH 20 MG DOSE TWICE Daily, Disp: 60 tablet, Rfl: 0   methylphenidate (RITALIN) 5 MG tablet, Take 1 tablet (5 mg total) by mouth 2 (two) times daily. TAKE WITH 20 MG DOSE TWICE Daily, Disp: 60 tablet, Rfl: 0   methylphenidate (RITALIN) 5 MG tablet, Take 1 tablet (5 mg total) by mouth 2 (two) times daily. TAKE WITH 20 MG DOSE TWICE Daily, Disp: 60 tablet, Rfl: 0   Multiple Vitamin (MULTIVITAMIN) tablet, Take 1 tablet by mouth daily., Disp: , Rfl:    OVER THE COUNTER MEDICATION, Ptt aking lions manes powder, Disp: , Rfl:    Red Yeast Rice Extract (RED YEAST RICE PO), Take by mouth., Disp: , Rfl:    Syringe/Needle, Disp, (SYRINGE 3CC/25GX1") 25G X 1" 3 ML MISC, Use to give b12 injection once monthly, Disp: 3 each, Rfl: 3  Medications ordered in this encounter:  Meds ordered this encounter  Medications   albuterol (VENTOLIN HFA) 108 (90 Base) MCG/ACT inhaler    Sig: Inhale 2 puffs into the lungs every 6 (six) hours as needed for wheezing or shortness of breath.    Dispense:  8 g    Refill:  0    Supervising Provider:   Merrilee Jansky [[id number]]   promethazine-dextromethorphan (PROMETHAZINE-DM) 6.25-15 MG/5ML syrup    Sig: Take 5 mLs by mouth 4 (four) times daily as needed for cough.    Dispense:  118 mL    Refill:  0    Supervising Provider:   Merrilee Jansky [[id number]]   azithromycin (ZITHROMAX) 250 MG  tablet    Sig: Take 2 tablets on day 1, then 1 tablet daily on days 2 through 5    Dispense:  6 tablet    Refill:  0    Supervising Provider:   Merrilee Jansky [phone]     *If you need refills on other medications prior to your next appointment, please contact your pharmacy*  Follow-Up: Call back or seek an in-person evaluation if the symptoms worsen or if the condition fails to improve as anticipated.  [facility] [phone]  Other Instructions Take antibiotic as prescribed.  Can use cough syrup as needed.  Can use inhaler as needed for wheezing or shortness of breath.  Recommend Mucinex.  If no improvement follow up with primary care physician or Urgent Care for in person evaluation.    If you have been instructed to have an in-person evaluation today at a local Urgent Care facility, please use the link below. It will take you to a list of all of our available [facility], including address, phone number and hours of operation. Please do not delay care.  [facility]  If you or a family member do not have a primary care provider, use the link below to schedule a visit and establish care. When you choose a [facility] primary care physician or advanced practice provider, you gain a long-term partner in health. Find a Primary Care Provider  Learn more about [facility]'s in-office and virtual care options: [facility] Get Care Now

## 2023-05-22 ENCOUNTER — Inpatient Hospital Stay: Payer: 59 | Attending: Oncology

## 2023-05-22 DIAGNOSIS — E538 Deficiency of other specified B group vitamins: Secondary | ICD-10-CM

## 2023-05-22 DIAGNOSIS — D510 Vitamin B12 deficiency anemia due to intrinsic factor deficiency: Secondary | ICD-10-CM | POA: Diagnosis present

## 2023-05-22 LAB — CBC WITH DIFFERENTIAL (CANCER CENTER ONLY)
Abs Immature Granulocytes: 0.02 10*3/uL (ref 0.00–0.07)
Basophils Absolute: 0.1 10*3/uL (ref 0.0–0.1)
Basophils Relative: 1 %
Eosinophils Absolute: 0.7 10*3/uL — ABNORMAL HIGH (ref 0.0–0.5)
Eosinophils Relative: 10 %
HCT: 40.3 % (ref 36.0–46.0)
Hemoglobin: 13.2 g/dL (ref 12.0–15.0)
Immature Granulocytes: 0 %
Lymphocytes Relative: 32 %
Lymphs Abs: 2.3 10*3/uL (ref 0.7–4.0)
MCH: 28.7 pg (ref 26.0–34.0)
MCHC: 32.8 g/dL (ref 30.0–36.0)
MCV: 87.6 fL (ref 80.0–100.0)
Monocytes Absolute: 0.5 10*3/uL (ref 0.1–1.0)
Monocytes Relative: 7 %
Neutro Abs: 3.6 10*3/uL (ref 1.7–7.7)
Neutrophils Relative %: 50 %
Platelet Count: 221 10*3/uL (ref 150–400)
RBC: 4.6 MIL/uL (ref 3.87–5.11)
RDW: 11.8 % (ref 11.5–15.5)
WBC Count: 7.1 10*3/uL (ref 4.0–10.5)
nRBC: 0 % (ref 0.0–0.2)

## 2023-05-22 LAB — VITAMIN B12: Vitamin B-12: 201 pg/mL (ref 180–914)

## 2023-05-24 ENCOUNTER — Inpatient Hospital Stay: Payer: 59

## 2023-05-24 ENCOUNTER — Encounter: Payer: Self-pay | Admitting: Oncology

## 2023-05-24 ENCOUNTER — Inpatient Hospital Stay: Payer: 59 | Admitting: Oncology

## 2023-05-24 VITALS — BP 134/92 | Temp 97.2°F | Resp 18 | Wt 152.4 lb

## 2023-05-24 DIAGNOSIS — E538 Deficiency of other specified B group vitamins: Secondary | ICD-10-CM | POA: Diagnosis not present

## 2023-05-24 DIAGNOSIS — D510 Vitamin B12 deficiency anemia due to intrinsic factor deficiency: Secondary | ICD-10-CM | POA: Diagnosis not present

## 2023-05-24 MED ORDER — B-12 2500 MCG SL SUBL: 2500.0000 ug | SUBLINGUAL_TABLET | Freq: Every day | SUBLINGUAL | 1 refills | Status: AC

## 2023-05-24 MED ORDER — CYANOCOBALAMIN 1000 MCG/ML IJ SOLN
1000.0000 ug | Freq: Once | INTRAMUSCULAR | Status: AC
  Administered 2023-05-24: 1000 ug via INTRAMUSCULAR
  Filled 2023-05-24: qty 1

## 2023-05-24 NOTE — Progress Notes (Signed)
 Pt here for follow up. Pt repots that she had endoscopy done in December. Pt also reports having basal cell removed from face recently. Pt wanting to discuss self administration of B12 injections.

## 2023-05-24 NOTE — Assessment & Plan Note (Addendum)
 Positive intrinsic factor antibody-pernicious anemia. I agree with parental vitamin B12 injections and monitor B12 levels periodically. Patient is interested to have vitamin B12 injections given by herself due to cost reasons.  We discussed about the necessity of proper technique of administration which may affect absorption.  Shared decision was made to proceed with B12 injection today in the clinic, she will try sublingual B12 2500mcg daily. We will repeat B12 in 3 months.  If sublingual B12 is not helpful, she would like to try self administrate B12 injections

## 2023-05-24 NOTE — Progress Notes (Signed)
 Hematology/Oncology Consult note Telephone:(336) [phone] Fax:(336) [phone]    CHIEF COMPLAINTS/REASON FOR VISIT:  Evaluation of B12 deficiency   ASSESSMENT & PLAN:   B12 deficiency Positive intrinsic factor antibody-pernicious anemia. I agree with parental vitamin B12 injections and monitor B12 levels periodically. Patient is interested to have vitamin B12 injections given by herself due to cost reasons.  We discussed about the necessity of proper technique of administration which may affect absorption.  Shared decision was made to proceed with B12 injection today in the clinic, she will try sublingual B12 2500mcg daily. We will repeat B12 in 3 months.  If sublingual B12 is not helpful, she would like to try self administrate B12 injections    Orders Placed This Encounter  Procedures   CBC with Differential (Cancer Center Only)    Standing Status:   Future    Expected Date:   11/21/2023    Expiration Date:   05/23/2024   Vitamin B12    Standing Status:   Future    Expected Date:   11/21/2023    Expiration Date:   05/23/2024   Vitamin B12    Standing Status:   Future    Expected Date:   08/22/2023    Expiration Date:   05/23/2024   Follow-up in 6 months. All questions were answered. The patient knows to call the clinic with any problems, questions or concerns.  Timmy Forbes, MD, PhD [facility] Hematology Oncology 05/24/2023   HISTORY OF PRESENTING ILLNESS:   Crystal Reyes is a  53 y.o.  female with PMH listed below was seen in consultation at the request of  Thersia Flax, MD  for evaluation of vitamin B-12 deficiency, positive intrinsic factor antibody  Patient has reported feeling fatigued lately. 09/23/2022, her blood work showed B12 of 98,  09/26/2022 positive intrinsic factor antibody Patient has been started on vitamin B12 1000 mcg injection weekly followed by monthly. She was referred to hematology for further evaluation discussion. + Weight gain.  Denies  any paresthesia, gait changes, memory loss. She drinks alcohol socially.  INTERVAL HISTORY Crystal Reyes is a 53 y.o. female who has above history reviewed by me today presents for follow up visit for B12 deficiency  Pt repots that she had endoscopy done in December. Pt also reports having basal cell removed from face recently. She reports feeling better after each B12 injections. She is concerned about the high copay cost form each injection.    MEDICAL HISTORY:  Past Medical History:  Diagnosis Date   Abnormal Pap smear of cervix 2010   normal biopsy, followup with GYN   Anemia    Attention deficit disorder of adult without mention of hyperactivity    managed with methylphenidate    B12 deficiency    Basal cell carcinoma    Chronic back pain    COVID-19 virus infection 07/09/2020   Headache     SURGICAL HISTORY: Past Surgical History:  Procedure Laterality Date   BUNIONECTOMY     COLPOSCOPY      SOCIAL HISTORY: Social History   Socioeconomic History   Marital status: Married    Spouse name: Not on file   Number of children: Not on file   Years of education: Not on file   Highest education level: Professional school degree (e.g., MD, DDS, DVM, JD)  Occupational History   Occupation: attorney    Employer: [employer]  Tobacco Use   Smoking status: Never   Smokeless tobacco: Never  Vaping  Use   Vaping status: Never Used  Substance and Sexual Activity   Alcohol use: Yes    Comment: wine occasional   Drug use: No   Sexual activity: Yes    Birth control/protection: None  Other Topics Concern   Not on file  Social History Narrative   Not on file   Social Drivers of Health   Financial Resource Strain: Low Risk  (09/26/2022)   Overall Financial Resource Strain (CARDIA)    Difficulty of Paying Living Expenses: Not very hard  Food Insecurity: No Food Insecurity (09/26/2022)   Hunger Vital Sign    Worried About Running Out of Food in the Last  Year: Never true    Ran Out of Food in the Last Year: Never true  Transportation Needs: No Transportation Needs (09/26/2022)   PRAPARE - Administrator, Civil Service (Medical): No    Lack of Transportation (Non-Medical): No  Physical Activity: Insufficiently Active (09/26/2022)   Exercise Vital Sign    Days of Exercise per Week: 1 day    Minutes of Exercise per Session: 30 min  Stress: No Stress Concern Present (09/26/2022)   [employer] of Occupational Health - Occupational Stress Questionnaire    Feeling of Stress : Only a little  Social Connections: Socially Integrated (09/26/2022)   Social Connection and Isolation Panel [NHANES]    Frequency of Communication with Friends and Family: More than three times a week    Frequency of Social Gatherings with Friends and Family: Once a week    Attends Religious Services: 1 to 4 times per year    Active Member of [employer] or Organizations: Yes    Attends Engineer, structural: More than 4 times per year    Marital Status: Married  Catering manager Violence: Not on file    FAMILY HISTORY: Family History  Problem Relation Age of Onset   Hypertension Mother    Hyperlipidemia Mother    Colon polyps Father 4   Hyperlipidemia Father    Hypertension Father    Colon polyps Sister 1   Heart disease Maternal Grandfather    Cancer Paternal Grandmother 36       melanoma   Colon cancer Paternal Grandfather    Breast cancer Cousin        2nd cousin    ALLERGIES:  is allergic to codeine.  MEDICATIONS:  Current Outpatient Medications  Medication Sig Dispense Refill   Cyanocobalamin  (B-12) 2500 MCG SUBL Place 2,500 mcg under the tongue daily. 90 tablet 1   cyanocobalamin  (VITAMIN B12) 1000 MCG/ML injection Inject 1,000 mcg into the muscle once.     hydroquinone 4 % cream Apply topically at bedtime.     methylphenidate  (RITALIN ) 20 MG tablet Take 1 tablet (20 mg total) by mouth 2 (two) times daily with breakfast and  lunch. To complete current mont prescripton 15 tablet 0   methylphenidate  (RITALIN ) 5 MG tablet Take 1 tablet (5 mg total) by mouth 2 (two) times daily. TAKE WITH 20 MG DOSE TWICE Daily 60 tablet 0   Multiple Vitamin (MULTIVITAMIN) tablet Take 1 tablet by mouth daily.     OVER THE COUNTER MEDICATION Ptt aking lions manes powder     promethazine -dextromethorphan (PROMETHAZINE -DM) 6.25-15 MG/5ML syrup Take 5 mLs by mouth 4 (four) times daily as needed for cough. 118 mL 0   Red Yeast Rice Extract (RED YEAST RICE PO) Take by mouth.     Syringe/Needle, Disp, (SYRINGE 3CC/25GX1") 25G X 1" 3 ML  MISC Use to give b12 injection once monthly 3 each 3   acetaminophen (TYLENOL) 325 MG tablet Take 650 mg by mouth every 6 (six) hours as needed. (Patient not taking: Reported on 05/24/2023)     meloxicam  (MOBIC ) 7.5 MG tablet TAKE 1 TABLET BY MOUTH EVERY DAY AS NEEDED FOR PAIN (Patient not taking: Reported on 05/24/2023) 30 tablet 1   methylphenidate  (RITALIN ) 20 MG tablet Take 1 tablet (20 mg total) by mouth 2 (two) times daily with breakfast and lunch. (Patient not taking: Reported on 05/24/2023) 60 tablet 0   methylphenidate  (RITALIN ) 20 MG tablet Take 1 tablet (20 mg total) by mouth 2 (two) times daily with breakfast and lunch. (Patient not taking: Reported on 05/24/2023) 60 tablet 0   methylphenidate  (RITALIN ) 20 MG tablet Take 1 tablet (20 mg total) by mouth 2 (two) times daily with breakfast and lunch. With 5 mg tablet 60 tablet 0   methylphenidate  (RITALIN ) 20 MG tablet Take 1 tablet (20 mg total) by mouth 2 (two) times daily with breakfast and lunch. (Patient not taking: Reported on 05/24/2023) 60 tablet 0   methylphenidate  (RITALIN ) 5 MG tablet Take 1 tablet (5 mg total) by mouth 2 (two) times daily. TAKE WITH 20 MG DOSE TWICE Daily (Patient not taking: Reported on 05/24/2023) 60 tablet 0   methylphenidate  (RITALIN ) 5 MG tablet Take 1 tablet (5 mg total) by mouth 2 (two) times daily. TAKE WITH 20 MG DOSE TWICE  Daily (Patient not taking: Reported on 05/24/2023) 60 tablet 0   No current facility-administered medications for this visit.    Review of Systems  Constitutional:  Negative for appetite change, chills, fatigue and fever.       Weight gain  HENT:   Negative for hearing loss and voice change.   Eyes:  Negative for eye problems.  Respiratory:  Negative for chest tightness and cough.   Cardiovascular:  Negative for chest pain.  Gastrointestinal:  Negative for abdominal distention, abdominal pain and blood in stool.  Endocrine: Negative for hot flashes.  Genitourinary:  Negative for difficulty urinating and frequency.   Musculoskeletal:  Negative for arthralgias.  Skin:  Negative for itching and rash.  Neurological:  Negative for extremity weakness.  Hematological:  Negative for adenopathy.  Psychiatric/Behavioral:  Negative for confusion.    PHYSICAL EXAMINATION:  Vitals:   05/24/23 1038  BP: (!) 134/92  Resp: 18  Temp: (!) 97.2 F (36.2 C)   Filed Weights   05/24/23 1038  Weight: 152 lb 6.4 oz (69.1 kg)    Physical Exam Constitutional:      General: She is not in acute distress. HENT:     Head: Normocephalic and atraumatic.  Eyes:     General: No scleral icterus. Cardiovascular:     Rate and Rhythm: Normal rate and regular rhythm.     Heart sounds: Normal heart sounds.  Pulmonary:     Effort: Pulmonary effort is normal. No respiratory distress.     Breath sounds: No wheezing.  Abdominal:     General: Bowel sounds are normal. There is no distension.     Palpations: Abdomen is soft.  Musculoskeletal:        General: No deformity. Normal range of motion.     Cervical back: Normal range of motion and neck supple.  Skin:    General: Skin is warm and dry.     Findings: No erythema or rash.  Neurological:     Mental Status: She is alert and oriented  to person, place, and time. Mental status is at baseline.     Cranial Nerves: No cranial nerve deficit.      Coordination: Coordination normal.  Psychiatric:        Mood and Affect: Mood normal.     LABORATORY DATA:  I have reviewed the data as listed    Latest Ref Rng & Units 05/22/2023    9:09 AM 11/18/2022   10:23 AM 04/07/2020    9:35 AM  CBC  WBC 4.0 - 10.5 K/uL 7.1  5.9  6.0   Hemoglobin 12.0 - 15.0 g/dL 96.0  45.4  09.8   Hematocrit 36.0 - 46.0 % 40.3  39.9  40.9   Platelets 150 - 400 K/uL 221  258  272.0       Latest Ref Rng & Units 09/23/2021    9:29 AM 04/07/2020    9:35 AM 07/13/2018    8:21 AM  CMP  Glucose 70 - 99 mg/dL 93  94  85   BUN 6 - 23 mg/dL 10  8  11    Creatinine 0.40 - 1.20 mg/dL 1.19  1.47  8.29   Sodium 135 - 145 mEq/L 138  137  139   Potassium 3.5 - 5.1 mEq/L 4.0  4.1  3.6   Chloride 96 - 112 mEq/L 103  102  105   CO2 19 - 32 mEq/L 28  31  29    Calcium 8.4 - 10.5 mg/dL 8.8  9.0  8.7   Total Protein 6.0 - 8.3 g/dL 6.7  6.8  6.7   Total Bilirubin 0.2 - 1.2 mg/dL 0.6  0.5  0.6   Alkaline Phos 39 - 117 U/L 59  61  55   AST 0 - 37 U/L 16  14  14    ALT 0 - 35 U/L 8  5  7        RADIOGRAPHIC STUDIES: I have personally reviewed the radiological images as listed and agreed with the findings in the report. No results found.

## 2023-07-07 ENCOUNTER — Other Ambulatory Visit: Payer: Self-pay | Admitting: Internal Medicine

## 2023-07-07 DIAGNOSIS — F902 Attention-deficit hyperactivity disorder, combined type: Secondary | ICD-10-CM

## 2023-07-09 MED ORDER — METHYLPHENIDATE HCL 20 MG PO TABS: 20.0000 mg | ORAL_TABLET | Freq: Two times a day (BID) | ORAL | 0 refills | Status: DC

## 2023-07-10 ENCOUNTER — Other Ambulatory Visit: Payer: Self-pay | Admitting: Internal Medicine

## 2023-07-10 DIAGNOSIS — F902 Attention-deficit hyperactivity disorder, combined type: Secondary | ICD-10-CM

## 2023-07-10 MED ORDER — METHYLPHENIDATE HCL 20 MG PO TABS: 20.0000 mg | ORAL_TABLET | Freq: Two times a day (BID) | ORAL | 0 refills | Status: DC

## 2023-07-10 MED ORDER — METHYLPHENIDATE HCL 5 MG PO TABS: 5.0000 mg | ORAL_TABLET | Freq: Two times a day (BID) | ORAL | 0 refills | Status: DC

## 2023-07-10 MED ORDER — METHYLPHENIDATE HCL 5 MG PO TABS
5.0000 mg | ORAL_TABLET | Freq: Two times a day (BID) | ORAL | 0 refills | Status: DC
Start: 2023-07-10 — End: 2023-12-01

## 2023-07-13 ENCOUNTER — Telehealth: Payer: Self-pay | Admitting: Oncology

## 2023-07-13 NOTE — Telephone Encounter (Signed)
 Patient called to reschedule her follow up lab/lab, md, and injections due to being out of town the weeks the they were scheduled. Rescheduled as requested.

## 2023-08-11 ENCOUNTER — Other Ambulatory Visit: Payer: Self-pay | Admitting: Internal Medicine

## 2023-08-14 ENCOUNTER — Encounter: Payer: Self-pay | Admitting: Internal Medicine

## 2023-08-14 MED ORDER — METHYLPHENIDATE HCL 20 MG PO TABS: 20.0000 mg | ORAL_TABLET | Freq: Two times a day (BID) | ORAL | 0 refills | Status: DC

## 2023-08-17 ENCOUNTER — Inpatient Hospital Stay: Attending: Oncology

## 2023-08-17 DIAGNOSIS — D510 Vitamin B12 deficiency anemia due to intrinsic factor deficiency: Secondary | ICD-10-CM | POA: Diagnosis present

## 2023-08-17 DIAGNOSIS — E538 Deficiency of other specified B group vitamins: Secondary | ICD-10-CM

## 2023-08-17 LAB — VITAMIN B12: Vitamin B-12: 1474 pg/mL — ABNORMAL HIGH (ref 180–914)

## 2023-08-21 ENCOUNTER — Other Ambulatory Visit: Payer: 59

## 2023-09-11 ENCOUNTER — Telehealth: Admitting: Physician Assistant

## 2023-09-11 DIAGNOSIS — L255 Unspecified contact dermatitis due to plants, except food: Secondary | ICD-10-CM | POA: Diagnosis not present

## 2023-09-11 MED ORDER — PREDNISONE 10 MG (21) PO TBPK
ORAL_TABLET | ORAL | 0 refills | Status: DC
Start: 2023-09-11 — End: 2023-11-13

## 2023-09-11 NOTE — Progress Notes (Signed)

## 2023-09-21 ENCOUNTER — Encounter: Payer: Self-pay | Admitting: Internal Medicine

## 2023-09-21 ENCOUNTER — Other Ambulatory Visit: Payer: Self-pay

## 2023-09-21 DIAGNOSIS — F902 Attention-deficit hyperactivity disorder, combined type: Secondary | ICD-10-CM

## 2023-09-22 MED ORDER — METHYLPHENIDATE HCL 5 MG PO TABS
5.0000 mg | ORAL_TABLET | Freq: Two times a day (BID) | ORAL | 0 refills | Status: DC
Start: 2023-09-22 — End: 2023-12-01

## 2023-09-22 MED ORDER — METHYLPHENIDATE HCL 20 MG PO TABS: 20.0000 mg | ORAL_TABLET | Freq: Two times a day (BID) | ORAL | 0 refills | Status: DC

## 2023-09-22 NOTE — Telephone Encounter (Signed)
 20 mg dose refilled: 08/14/2023 5 mg dose refilled: 07/10/2023 Last OV: 04/04/2023 Next OV: not scheduled

## 2023-09-22 NOTE — Addendum Note (Signed)
 Addended by: Chadwick Colonel on: 09/22/2023 09:22 AM   Modules accepted: Orders

## 2023-10-03 ENCOUNTER — Other Ambulatory Visit: Payer: Self-pay | Admitting: Internal Medicine

## 2023-10-03 DIAGNOSIS — Z1231 Encounter for screening mammogram for malignant neoplasm of breast: Secondary | ICD-10-CM

## 2023-10-17 ENCOUNTER — Ambulatory Visit
Admission: RE | Admit: 2023-10-17 | Discharge: 2023-10-17 | Disposition: A | Discharge status: Home / Self Care | Source: Ambulatory Visit | Attending: Internal Medicine | Admitting: Internal Medicine

## 2023-10-17 DIAGNOSIS — Z1231 Encounter for screening mammogram for malignant neoplasm of breast: Secondary | ICD-10-CM | POA: Diagnosis present

## 2023-11-13 ENCOUNTER — Inpatient Hospital Stay: Attending: Oncology

## 2023-11-13 ENCOUNTER — Encounter: Payer: Self-pay | Admitting: Internal Medicine

## 2023-11-13 DIAGNOSIS — D510 Vitamin B12 deficiency anemia due to intrinsic factor deficiency: Secondary | ICD-10-CM | POA: Insufficient documentation

## 2023-11-13 DIAGNOSIS — F902 Attention-deficit hyperactivity disorder, combined type: Secondary | ICD-10-CM

## 2023-11-13 DIAGNOSIS — E538 Deficiency of other specified B group vitamins: Secondary | ICD-10-CM

## 2023-11-13 LAB — VITAMIN B12: Vitamin B-12: 927 pg/mL — ABNORMAL HIGH (ref 180–914)

## 2023-11-13 LAB — CBC WITH DIFFERENTIAL (CANCER CENTER ONLY)
Abs Immature Granulocytes: 0.02 K/uL (ref 0.00–0.07)
Basophils Absolute: 0.1 K/uL (ref 0.0–0.1)
Basophils Relative: 1 %
Eosinophils Absolute: 0.3 K/uL (ref 0.0–0.5)
Eosinophils Relative: 6 %
HCT: 38.7 % (ref 36.0–46.0)
Hemoglobin: 12.8 g/dL (ref 12.0–15.0)
Immature Granulocytes: 0 %
Lymphocytes Relative: 38 %
Lymphs Abs: 2.2 K/uL (ref 0.7–4.0)
MCH: 29.2 pg (ref 26.0–34.0)
MCHC: 33.1 g/dL (ref 30.0–36.0)
MCV: 88.4 fL (ref 80.0–100.0)
Monocytes Absolute: 0.4 K/uL (ref 0.1–1.0)
Monocytes Relative: 7 %
Neutro Abs: 2.8 K/uL (ref 1.7–7.7)
Neutrophils Relative %: 48 %
Platelet Count: 241 K/uL (ref 150–400)
RBC: 4.38 MIL/uL (ref 3.87–5.11)
RDW: 11.9 % (ref 11.5–15.5)
WBC Count: 5.9 K/uL (ref 4.0–10.5)
nRBC: 0 % (ref 0.0–0.2)

## 2023-11-13 MED ORDER — METHYLPHENIDATE HCL 20 MG PO TABS: 20.0000 mg | ORAL_TABLET | Freq: Two times a day (BID) | ORAL | 0 refills | Status: DC

## 2023-11-15 ENCOUNTER — Inpatient Hospital Stay

## 2023-11-15 ENCOUNTER — Encounter: Payer: Self-pay | Admitting: Oncology

## 2023-11-15 ENCOUNTER — Inpatient Hospital Stay: Admitting: Oncology

## 2023-11-15 VITALS — BP 121/64 | HR 66 | Temp 97.6°F | Resp 16 | Wt 157.0 lb

## 2023-11-15 DIAGNOSIS — E538 Deficiency of other specified B group vitamins: Secondary | ICD-10-CM | POA: Diagnosis not present

## 2023-11-15 DIAGNOSIS — D510 Vitamin B12 deficiency anemia due to intrinsic factor deficiency: Secondary | ICD-10-CM | POA: Diagnosis not present

## 2023-11-15 NOTE — Progress Notes (Signed)
 Hematology/Oncology Consult note Telephone:(336) [phone] Fax:(336) [phone]    CHIEF COMPLAINTS/REASON FOR VISIT:  Pernicious anemia, B12 deficiency   ASSESSMENT & PLAN:   B12 deficiency Positive intrinsic factor antibody-pernicious anemia. B12 has improved, slightly elevated.  Recommend to decrease sublingual B12 2500mcg from daily to 4 times per weeks.  We will repeat B12 in 6 months.     Orders Placed This Encounter  Procedures   Vitamin B12    Standing Status:   Future    Expected Date:   05/17/2024    Expiration Date:   08/15/2024   Follow-up in 6 months. All questions were answered. The patient knows to call the clinic with any problems, questions or concerns.  Zelphia Cap, MD, PhD [facility] Hematology Oncology 11/15/2023   HISTORY OF PRESENTING ILLNESS:   Crystal Reyes is a  53 y.o.  female with PMH listed below was seen in consultation at the request of  Marylynn Verneita CROME, MD  for evaluation of vitamin B-12 deficiency, positive intrinsic factor antibody  Patient has reported feeling fatigued lately. 09/23/2022, her blood work showed B12 of 98,  09/26/2022 positive intrinsic factor antibody Patient has been started on vitamin B12 1000 mcg injection weekly followed by monthly. She was referred to hematology for further evaluation discussion. + Weight gain.  Denies any paresthesia, gait changes, memory loss. She drinks alcohol socially. EGD colonoscopy done in Dec 2024.   INTERVAL HISTORY Crystal Reyes is a 53 y.o. female who has above history reviewed by me today presents for follow up visit for B12 deficiency  She takes B12 sublingual 2500mcg daily. Tolerates well. No new complaints    MEDICAL HISTORY:  Past Medical History:  Diagnosis Date   Abnormal Pap smear of cervix 2010   normal biopsy, followup with GYN   Anemia    Attention deficit disorder of adult without mention of hyperactivity    managed with methylphenidate    B12  deficiency    Basal cell carcinoma    Chronic back pain    COVID-19 virus infection 07/09/2020   Headache     SURGICAL HISTORY: Past Surgical History:  Procedure Laterality Date   BUNIONECTOMY     COLPOSCOPY      SOCIAL HISTORY: Social History   Socioeconomic History   Marital status: Married    Spouse name: Not on file   Number of children: Not on file   Years of education: Not on file   Highest education level: Professional school degree (e.g., MD, DDS, DVM, JD)  Occupational History   Occupation: attorney    Employer: [employer]  Tobacco Use   Smoking status: Never   Smokeless tobacco: Never  Vaping Use   Vaping status: Never Used  Substance and Sexual Activity   Alcohol use: Yes    Comment: wine occasional   Drug use: No   Sexual activity: Yes    Birth control/protection: None  Other Topics Concern   Not on file  Social History Narrative   Not on file   Social Drivers of Health   Financial Resource Strain: Low Risk  (09/26/2022)   Overall Financial Resource Strain (CARDIA)    Difficulty of Paying Living Expenses: Not very hard  Food Insecurity: No Food Insecurity (09/26/2022)   Hunger Vital Sign    Worried About Running Out of Food in the Last Year: Never true    Ran Out of Food in the Last Year: Never true  Transportation Needs: No Transportation Needs (09/26/2022)  PRAPARE - Administrator, Civil Service (Medical): No    Lack of Transportation (Non-Medical): No  Physical Activity: Insufficiently Active (09/26/2022)   Exercise Vital Sign    Days of Exercise per Week: 1 day    Minutes of Exercise per Session: 30 min  Stress: No Stress Concern Present (09/26/2022)   [employer] of Occupational Health - Occupational Stress Questionnaire    Feeling of Stress : Only a little  Social Connections: Socially Integrated (09/26/2022)   Social Connection and Isolation Panel    Frequency of Communication with Friends and Family: More than  three times a week    Frequency of Social Gatherings with Friends and Family: Once a week    Attends Religious Services: 1 to 4 times per year    Active Member of [employer] or Organizations: Yes    Attends Engineer, structural: More than 4 times per year    Marital Status: Married  Catering manager Violence: Not on file    FAMILY HISTORY: Family History  Problem Relation Age of Onset   Hypertension Mother    Hyperlipidemia Mother    Colon polyps Father 63   Hyperlipidemia Father    Hypertension Father    Colon polyps Sister 68   Heart disease Maternal Grandfather    Cancer Paternal Grandmother 66       melanoma   Colon cancer Paternal Grandfather    Breast cancer Cousin        2nd cousin    ALLERGIES:  is allergic to codeine.  MEDICATIONS:  Current Outpatient Medications  Medication Sig Dispense Refill   Cyanocobalamin  (B-12) 2500 MCG SUBL Place 2,500 mcg under the tongue daily. 90 tablet 1   cyanocobalamin  (VITAMIN B12) 1000 MCG/ML injection Inject 1,000 mcg into the muscle once.     hydroquinone 4 % cream Apply topically at bedtime.     methylphenidate  (RITALIN ) 20 MG tablet Take 1 tablet (20 mg total) by mouth 2 (two) times daily with breakfast and lunch. With 5 mg tablet 60 tablet 0   methylphenidate  (RITALIN ) 20 MG tablet Take 1 tablet (20 mg total) by mouth 2 (two) times daily with breakfast and lunch. 60 tablet 0   methylphenidate  (RITALIN ) 20 MG tablet Take 1 tablet (20 mg total) by mouth 2 (two) times daily with breakfast and lunch. 60 tablet 0   methylphenidate  (RITALIN ) 20 MG tablet Take 1 tablet (20 mg total) by mouth 2 (two) times daily with breakfast and lunch. To complete current mont prescripton 60 tablet 0   methylphenidate  (RITALIN ) 5 MG tablet Take 1 tablet (5 mg total) by mouth 2 (two) times daily. TAKE WITH 20 MG DOSE TWICE Daily 60 tablet 0   methylphenidate  (RITALIN ) 5 MG tablet Take 1 tablet (5 mg total) by mouth 2 (two) times daily. TAKE WITH 20 MG  DOSE TWICE Daily 60 tablet 0   Multiple Vitamin (MULTIVITAMIN) tablet Take 1 tablet by mouth daily.     OVER THE COUNTER MEDICATION Ptt aking lions manes powder     Red Yeast Rice Extract (RED YEAST RICE PO) Take by mouth.     Syringe/Needle, Disp, (SYRINGE 3CC/25GX1) 25G X 1 3 ML MISC Use to give b12 injection once monthly 3 each 3   meloxicam  (MOBIC ) 7.5 MG tablet TAKE 1 TABLET BY MOUTH EVERY DAY AS NEEDED FOR PAIN (Patient not taking: Reported on 11/15/2023) 30 tablet 1   methylphenidate  (RITALIN ) 20 MG tablet Take 1 tablet (20 mg total) by  mouth 2 (two) times daily with breakfast and lunch. (Patient not taking: Reported on 11/15/2023) 60 tablet 0   methylphenidate  (RITALIN ) 5 MG tablet Take 1 tablet (5 mg total) by mouth 2 (two) times daily. TAKE WITH 20 MG DOSE TWICE Daily (Patient not taking: Reported on 11/15/2023) 60 tablet 0   No current facility-administered medications for this visit.    Review of Systems  Constitutional:  Negative for appetite change, chills, fatigue and fever.       Weight gain  HENT:   Negative for hearing loss and voice change.   Eyes:  Negative for eye problems.  Respiratory:  Negative for chest tightness and cough.   Cardiovascular:  Negative for chest pain.  Gastrointestinal:  Negative for abdominal distention, abdominal pain and blood in stool.  Endocrine: Negative for hot flashes.  Genitourinary:  Negative for difficulty urinating and frequency.   Musculoskeletal:  Negative for arthralgias.  Skin:  Negative for itching and rash.  Neurological:  Negative for extremity weakness.  Hematological:  Negative for adenopathy.  Psychiatric/Behavioral:  Negative for confusion.    PHYSICAL EXAMINATION:  Vitals:   11/15/23 1037  BP: 121/64  Pulse: 66  Resp: 16  Temp: 97.6 F (36.4 C)  SpO2: 100%   Filed Weights   11/15/23 1037  Weight: 157 lb (71.2 kg)    Physical Exam Constitutional:      General: She is not in acute distress. HENT:     Head:  Normocephalic and atraumatic.  Eyes:     General: No scleral icterus. Cardiovascular:     Rate and Rhythm: Normal rate and regular rhythm.     Heart sounds: Normal heart sounds.  Pulmonary:     Effort: Pulmonary effort is normal. No respiratory distress.     Breath sounds: No wheezing.  Abdominal:     General: Bowel sounds are normal. There is no distension.     Palpations: Abdomen is soft.  Musculoskeletal:        General: No deformity. Normal range of motion.     Cervical back: Normal range of motion and neck supple.  Skin:    General: Skin is warm and dry.     Findings: No erythema or rash.  Neurological:     Mental Status: She is alert and oriented to person, place, and time. Mental status is at baseline.     Cranial Nerves: No cranial nerve deficit.     Coordination: Coordination normal.  Psychiatric:        Mood and Affect: Mood normal.     LABORATORY DATA:  I have reviewed the data as listed    Latest Ref Rng & Units 11/13/2023    9:02 AM 05/22/2023    9:09 AM 11/18/2022   10:23 AM  CBC  WBC 4.0 - 10.5 K/uL 5.9  7.1  5.9   Hemoglobin 12.0 - 15.0 g/dL 87.1  86.7  86.9   Hematocrit 36.0 - 46.0 % 38.7  40.3  39.9   Platelets 150 - 400 K/uL 241  221  258       Latest Ref Rng & Units 09/23/2021    9:29 AM 04/07/2020    9:35 AM 07/13/2018    8:21 AM  CMP  Glucose 70 - 99 mg/dL 93  94  85   BUN 6 - 23 mg/dL 10  8  11    Creatinine 0.40 - 1.20 mg/dL 9.24  9.15  9.24   Sodium 135 - 145 mEq/L 138  137  139   Potassium 3.5 - 5.1 mEq/L 4.0  4.1  3.6   Chloride 96 - 112 mEq/L 103  102  105   CO2 19 - 32 mEq/L 28  31  29    Calcium 8.4 - 10.5 mg/dL 8.8  9.0  8.7   Total Protein 6.0 - 8.3 g/dL 6.7  6.8  6.7   Total Bilirubin 0.2 - 1.2 mg/dL 0.6  0.5  0.6   Alkaline Phos 39 - 117 U/L 59  61  55   AST 0 - 37 U/L 16  14  14    ALT 0 - 35 U/L 8  5  7        RADIOGRAPHIC STUDIES: I have personally reviewed the radiological images as listed and agreed with the findings in the  report. MM 3D SCREENING MAMMOGRAM BILATERAL BREAST Result Date: 10/19/2023 CLINICAL DATA:  Screening. EXAM: DIGITAL SCREENING BILATERAL MAMMOGRAM WITH TOMOSYNTHESIS AND CAD TECHNIQUE: Bilateral screening digital craniocaudal and mediolateral oblique mammograms were obtained. Bilateral screening digital breast tomosynthesis was performed. The images were evaluated with computer-aided detection. COMPARISON:  Previous exam(s). ACR Breast Density Category b: There are scattered areas of fibroglandular density. FINDINGS: There are no findings suspicious for malignancy. IMPRESSION: No mammographic evidence of malignancy. A result letter of this screening mammogram will be mailed directly to the patient. RECOMMENDATION: Screening mammogram in one year. (Code:SM-B-01Y) BI-RADS CATEGORY  1: Negative. Electronically Signed   By: Toribio Agreste M.D.   On: 10/19/2023 13:38

## 2023-11-15 NOTE — Assessment & Plan Note (Addendum)
 Positive intrinsic factor antibody-pernicious anemia. B12 has improved, slightly elevated.  Recommend to decrease sublingual B12 2500mcg from daily to 4 times per weeks.  We will repeat B12 in 6 months.

## 2023-11-20 ENCOUNTER — Other Ambulatory Visit: Payer: 59

## 2023-11-22 ENCOUNTER — Ambulatory Visit: Payer: 59 | Admitting: Oncology

## 2023-11-22 ENCOUNTER — Ambulatory Visit: Payer: 59

## 2023-12-01 ENCOUNTER — Encounter: Payer: Self-pay | Admitting: Internal Medicine

## 2023-12-01 ENCOUNTER — Telehealth: Admitting: Internal Medicine

## 2023-12-01 VITALS — Ht 64.0 in | Wt 161.0 lb

## 2023-12-01 DIAGNOSIS — F988 Other specified behavioral and emotional disorders with onset usually occurring in childhood and adolescence: Secondary | ICD-10-CM | POA: Diagnosis not present

## 2023-12-01 DIAGNOSIS — E663 Overweight: Secondary | ICD-10-CM

## 2023-12-01 DIAGNOSIS — F902 Attention-deficit hyperactivity disorder, combined type: Secondary | ICD-10-CM

## 2023-12-01 MED ORDER — METHYLPHENIDATE HCL 5 MG PO TABS: 5.0000 mg | ORAL_TABLET | Freq: Two times a day (BID) | ORAL | 0 refills | Status: DC

## 2023-12-01 MED ORDER — METHYLPHENIDATE HCL 5 MG PO TABS
5.0000 mg | ORAL_TABLET | Freq: Two times a day (BID) | ORAL | 0 refills | Status: DC
Start: 2023-12-01 — End: 2023-12-01

## 2023-12-01 MED ORDER — METHYLPHENIDATE HCL 20 MG PO TABS: 20.0000 mg | ORAL_TABLET | Freq: Two times a day (BID) | ORAL | 0 refills | Status: DC

## 2023-12-01 NOTE — Progress Notes (Signed)
 Virtual Visit via Caregility   Note   This format is felt to be most appropriate for this patient at this time.  All issues noted in this document were discussed and addressed.  No physical exam was performed (except for noted visual exam findings with Video Visits).   I connected with Crystal Reyes on 12/02/23 at  5:00 PM EDT by a video enabled telemedicine application  and verified that I am speaking with the correct person using two identifiers. Location patient: home Location provider: work or home office Persons participating in the virtual visit: patient, provider  I discussed the limitations, risks, security and privacy concerns of performing an evaluation and management service by telephone and the availability of in person appointments. I also discussed with the patient that there may be a patient responsible charge related to this service. The patient expressed understanding and agreed to proceed.  Reason for visit: medication refill   HPI:   ADD:  she is is performing well using methylphenidate  25 mg bid , despite change in work environment resulting in working from home several days per week.  Refill history confirmed via [place] Controlled Substance database, accessed by me today.. she denies sleep issues or irritability.    Overweight:  she remains frustrated at her inability to lose 10 lbs that seh has gained over the last several years.  She is not exercising regularly due to work and family obligations,  and snacks late at night while working.    ROS: See pertinent positives and negatives per HPI.  Past Medical History:  Diagnosis Date   Abnormal Pap smear of cervix 2010   normal biopsy, followup with GYN   Anemia    Attention deficit disorder of adult without mention of hyperactivity    managed with methylphenidate    B12 deficiency    Basal cell carcinoma    Chronic back pain    COVID-19 virus infection 07/09/2020   Headache     Past Surgical History:  Procedure Laterality  Date   BUNIONECTOMY     COLPOSCOPY      Family History  Problem Relation Age of Onset   Hypertension Mother    Hyperlipidemia Mother    Colon polyps Father 44   Hyperlipidemia Father    Hypertension Father    Colon polyps Sister 64   Heart disease Maternal Grandfather    Cancer Paternal Grandmother 24       melanoma   Colon cancer Paternal Grandfather    Breast cancer Cousin        2nd cousin    SOCIAL HX:  reports that she has never smoked. She has never used smokeless tobacco. She reports current alcohol use. She reports that she does not use drugs.    Current Outpatient Medications:    Cyanocobalamin  (B-12) 2500 MCG SUBL, Place 2,500 mcg under the tongue daily., Disp: 90 tablet, Rfl: 1   hydroquinone 4 % cream, Apply topically at bedtime., Disp: , Rfl:    meloxicam  (MOBIC ) 7.5 MG tablet, TAKE 1 TABLET BY MOUTH EVERY DAY AS NEEDED FOR PAIN (Patient taking differently: Take 7.5 mg by mouth as needed.), Disp: 30 tablet, Rfl: 1   Multiple Vitamin (MULTIVITAMIN) tablet, Take 1 tablet by mouth daily., Disp: , Rfl:    OVER THE COUNTER MEDICATION, Ptt aking lions manes powder, Disp: , Rfl:    Red Yeast Rice Extract (RED YEAST RICE PO), Take by mouth., Disp: , Rfl:    Syringe/Needle, Disp, (SYRINGE 3CC/25GX1) 25G X 1 3  ML MISC, Use to give b12 injection once monthly, Disp: 3 each, Rfl: 3   cyanocobalamin  (VITAMIN B12) 1000 MCG/ML injection, Inject 1,000 mcg into the muscle once. (Patient not taking: Reported on 12/01/2023), Disp: , Rfl:    methylphenidate  (RITALIN ) 20 MG tablet, Take 1 tablet (20 mg total) by mouth 2 (two) times daily with breakfast and lunch., Disp: 60 tablet, Rfl: 0   methylphenidate  (RITALIN ) 20 MG tablet, Take 1 tablet (20 mg total) by mouth 2 (two) times daily with breakfast and lunch., Disp: 60 tablet, Rfl: 0   methylphenidate  (RITALIN ) 20 MG tablet, Take 1 tablet (20 mg total) by mouth 2 (two) times daily with breakfast and lunch., Disp: 60 tablet, Rfl: 0    methylphenidate  (RITALIN ) 20 MG tablet, Take 1 tablet (20 mg total) by mouth 2 (two) times daily with breakfast and lunch. With 5 mg tablet, Disp: 60 tablet, Rfl: 0   methylphenidate  (RITALIN ) 20 MG tablet, Take 1 tablet (20 mg total) by mouth 2 (two) times daily with breakfast and lunch. To complete current mont prescripton, Disp: 60 tablet, Rfl: 0   methylphenidate  (RITALIN ) 5 MG tablet, Take 1 tablet (5 mg total) by mouth 2 (two) times daily. TAKE WITH 20 MG DOSE TWICE Daily, Disp: 60 tablet, Rfl: 0  EXAM:  VITALS per patient if applicable:  GENERAL: alert, oriented, appears well and in no acute distress  HEENT: atraumatic, conjunttiva clear, no obvious abnormalities on inspection of external nose and ears  NECK: normal movements of the head and neck  LUNGS: on inspection no signs of respiratory distress, breathing rate appears normal, no obvious gross SOB, gasping or wheezing  CV: no obvious cyanosis  MS: moves all visible extremities without noticeable abnormality  PSYCH/NEURO: pleasant and cooperative, no obvious depression or anxiety, speech and thought processing grossly intact  ASSESSMENT AND PLAN: Overweight (BMI 25.0-29.9) Assessment & Plan: She  was  able to reduce weight  in the past to her goal of  135 with diligence and exercise but has regained weight  due to schedule and lack of regular exercise .  Screening labs have been normal .  Counselling about diet, portion reduction,  and need for exercise given.    Attention deficit hyperactivity disorder (ADHD), combined type -     Methylphenidate  HCl; Take 1 tablet (5 mg total) by mouth 2 (two) times daily. TAKE WITH 20 MG DOSE TWICE Daily  Dispense: 60 tablet; Refill: 0  Adult attention deficit disorder Assessment & Plan: Patient is functioning  well  without side effects on current dose of  25 mg  methylphenidate   bid .  No changes today.  Refills have been sent for 3 months of the 20 mg dose and1 month of the 5 mg dase;  however, Repeated attempts to send the other 2 months of the  5 mg methylphenidate  doses have failed.     Other orders -     Methylphenidate  HCl; Take 1 tablet (20 mg total) by mouth 2 (two) times daily with breakfast and lunch.  Dispense: 60 tablet; Refill: 0 -     Methylphenidate  HCl; Take 1 tablet (20 mg total) by mouth 2 (two) times daily with breakfast and lunch. With 5 mg tablet  Dispense: 60 tablet; Refill: 0 -     Methylphenidate  HCl; Take 1 tablet (20 mg total) by mouth 2 (two) times daily with breakfast and lunch. To complete current mont prescripton  Dispense: 60 tablet; Refill: 0      I  discussed the assessment and treatment plan with the patient. The patient was provided an opportunity to ask questions and all were answered. The patient agreed with the plan and demonstrated an understanding of the instructions.   The patient was advised to call back or seek an in-person evaluation if the symptoms worsen or if the condition fails to improve as anticipated.   I spent 30 minutes dedicated to the care of this patient on the date of this encounter to include pre-visit review of patient's medical history,  including recent ER visit, imaging studies and labs, face-to-face time with the patient , and post visit ordering of testing and therapeutics.    Verneita LITTIE Kettering, MD

## 2023-12-02 MED ORDER — METHYLPHENIDATE HCL 5 MG PO TABS: 5.0000 mg | ORAL_TABLET | Freq: Two times a day (BID) | ORAL | 0 refills | Status: DC

## 2023-12-02 NOTE — Assessment & Plan Note (Signed)
 She  was  able to reduce weight  in the past to her goal of  135 with diligence and exercise but has regained weight  due to schedule and lack of regular exercise .  Screening labs have been normal .  Counselling about diet, portion reduction,  and need for exercise given.

## 2023-12-02 NOTE — Assessment & Plan Note (Signed)
 Patient is functioning  well  without side effects on current dose of  25 mg  methylphenidate   bid .  No changes today.  Refills have been sent for 3 months of the 20 mg dose and1 month of the 5 mg dase; however, Repeated attempts to send the other 2 months of the  5 mg methylphenidate  doses have failed.

## 2024-01-29 ENCOUNTER — Encounter: Payer: Self-pay | Admitting: Internal Medicine

## 2024-01-29 DIAGNOSIS — F902 Attention-deficit hyperactivity disorder, combined type: Secondary | ICD-10-CM

## 2024-01-29 MED ORDER — METHYLPHENIDATE HCL 5 MG PO TABS: 5.0000 mg | ORAL_TABLET | Freq: Two times a day (BID) | ORAL | 0 refills | Status: DC

## 2024-01-29 NOTE — Telephone Encounter (Signed)
 Refilled: 12/01/2023 Last OV: 12/01/2023 Next OV: not scheduled

## 2024-02-14 ENCOUNTER — Encounter: Payer: Self-pay | Admitting: Oncology

## 2024-02-21 MED ORDER — METHYLPHENIDATE HCL 5 MG PO TABS: 5.0000 mg | ORAL_TABLET | Freq: Two times a day (BID) | ORAL | 0 refills | Status: DC

## 2024-02-21 MED ORDER — METHYLPHENIDATE HCL 20 MG PO TABS: 20.0000 mg | ORAL_TABLET | Freq: Two times a day (BID) | ORAL | 0 refills | Status: DC

## 2024-02-21 MED ORDER — METHYLPHENIDATE HCL 20 MG PO TABS: 20.0000 mg | ORAL_TABLET | Freq: Two times a day (BID) | ORAL | 0 refills | Status: AC

## 2024-02-21 NOTE — Telephone Encounter (Signed)
 Pt requesting refill on Ritalin .   Last Visit: 12/01/23 virtual Next Visit: Visit date not found Last Refill: E-Prescribing Status: Receipt confirmed by pharmacy (01/29/2024  4:18 PM EDT)   Please Advise

## 2024-02-21 NOTE — Addendum Note (Signed)
 Addended by: MARYLYNN VERNEITA CROME on: 02/21/2024 01:20 PM   Modules accepted: Orders

## 2024-05-10 ENCOUNTER — Encounter: Payer: Self-pay | Admitting: Oncology

## 2024-05-17 ENCOUNTER — Encounter: Payer: Self-pay | Admitting: Internal Medicine

## 2024-05-17 ENCOUNTER — Inpatient Hospital Stay: Attending: Oncology

## 2024-05-17 DIAGNOSIS — F902 Attention-deficit hyperactivity disorder, combined type: Secondary | ICD-10-CM

## 2024-05-17 DIAGNOSIS — E538 Deficiency of other specified B group vitamins: Secondary | ICD-10-CM | POA: Diagnosis present

## 2024-05-17 LAB — VITAMIN B12: Vitamin B-12: 1362 pg/mL — ABNORMAL HIGH (ref 180–914)

## 2024-05-20 MED ORDER — METHYLPHENIDATE HCL 5 MG PO TABS: 5.0000 mg | ORAL_TABLET | Freq: Two times a day (BID) | ORAL | 0 refills | Status: AC

## 2024-05-20 MED ORDER — METHYLPHENIDATE HCL 20 MG PO TABS: 20.0000 mg | ORAL_TABLET | Freq: Two times a day (BID) | ORAL | 0 refills | Status: AC

## 2024-05-23 ENCOUNTER — Encounter: Payer: Self-pay | Admitting: Oncology

## 2024-05-23 ENCOUNTER — Inpatient Hospital Stay (HOSPITAL_BASED_OUTPATIENT_CLINIC_OR_DEPARTMENT_OTHER): Admitting: Oncology

## 2024-05-23 DIAGNOSIS — E538 Deficiency of other specified B group vitamins: Secondary | ICD-10-CM | POA: Diagnosis not present

## 2024-05-23 NOTE — Progress Notes (Signed)
 HEMATOLOGY-ONCOLOGY TeleHEALTH VISIT PROGRESS NOTE  I connected with Crystal Reyes on 05/23/24  at  3:30 PM EST by video enabled telemedicine visit and verified that I am speaking with the correct person using two identifiers. I discussed the limitations, risks, security and privacy concerns of performing an evaluation and management service by telemedicine and the availability of in-person appointments. The patient expressed understanding and agreed to proceed.   Other persons participating in the visit and their role in the encounter:  None  Patient's location: Home  Provider's location: office Chief Complaint: B12 deficiency   INTERVAL HISTORY Crystal Reyes is a 54 y.o. female who has above history reviewed by me today presents for follow up visit for management of B12 deficiency She takes B12 sublingual 2500mcg 3-4 time per week. No new complains.   Review of Systems  Constitutional:  Negative for appetite change, chills, fatigue and fever.  HENT:   Negative for hearing loss and voice change.   Eyes:  Negative for eye problems.  Respiratory:  Negative for chest tightness and cough.   Cardiovascular:  Negative for chest pain.  Gastrointestinal:  Negative for abdominal distention, abdominal pain and blood in stool.  Endocrine: Negative for hot flashes.  Genitourinary:  Negative for difficulty urinating and frequency.   Musculoskeletal:  Negative for arthralgias.  Skin:  Negative for itching and rash.  Neurological:  Negative for extremity weakness.  Hematological:  Negative for adenopathy.  Psychiatric/Behavioral:  Negative for confusion.     Past Medical History:  Diagnosis Date   Abnormal Pap smear of cervix 2010   normal biopsy, followup with GYN   Anemia    Attention deficit disorder of adult without mention of hyperactivity    managed with methylphenidate    B12 deficiency    Basal cell carcinoma    Chronic back pain    COVID-19 virus infection  07/09/2020   Headache    Past Surgical History:  Procedure Laterality Date   BUNIONECTOMY     COLPOSCOPY      Family History  Problem Relation Age of Onset   Hypertension Mother    Hyperlipidemia Mother    Colon polyps Father 87   Hyperlipidemia Father    Hypertension Father    Colon polyps Sister 74   Heart disease Maternal Grandfather    Cancer Paternal Grandmother 75       melanoma   Colon cancer Paternal Grandfather    Breast cancer Cousin        2nd cousin    Social History   Socioeconomic History   Marital status: Married    Spouse name: Not on file   Number of children: Not on file   Years of education: Not on file   Highest education level: Professional school degree (e.g., MD, DDS, DVM, JD)  Occupational History   Occupation: attorney    Employer: [employer]  Tobacco Use   Smoking status: Never   Smokeless tobacco: Never  Vaping Use   Vaping status: Never Used  Substance and Sexual Activity   Alcohol use: Yes    Comment: wine occasional   Drug use: No   Sexual activity: Yes    Birth control/protection: None  Other Topics Concern   Not on file  Social History Narrative   Not on file   Social Drivers of Health   Tobacco Use: Low Risk (05/23/2024)   Patient History    Smoking Tobacco Use: Never    Smokeless Tobacco Use: Never  Passive Exposure: Not on file  Financial Resource Strain: Low Risk (11/30/2023)   Overall Financial Resource Strain (CARDIA)    Difficulty of Paying Living Expenses: Not very hard  Food Insecurity: No Food Insecurity (11/30/2023)   Epic    Worried About Programme Researcher, Broadcasting/film/video in the Last Year: Never true    Ran Out of Food in the Last Year: Never true  Transportation Needs: No Transportation Needs (11/30/2023)   Epic    Lack of Transportation (Medical): No    Lack of Transportation (Non-Medical): No  Physical Activity: Insufficiently Active (11/30/2023)   Exercise Vital Sign    Days of Exercise per Week: 1 day     Minutes of Exercise per Session: 30 min  Stress: No Stress Concern Present (11/30/2023)   [employer] of Occupational Health - Occupational Stress Questionnaire    Feeling of Stress: Only a little  Social Connections: Socially Integrated (11/30/2023)   Social Connection and Isolation Panel    Frequency of Communication with Friends and Family: More than three times a week    Frequency of Social Gatherings with Friends and Family: Twice a week    Attends Religious Services: 1 to 4 times per year    Active Member of Clubs or Organizations: Yes    Attends Engineer, Structural: More than 4 times per year    Marital Status: Married  Catering Manager Violence: Not on file  Depression (PHQ2-9): Low Risk (11/15/2023)   Depression (PHQ2-9)    PHQ-2 Score: 0  Alcohol Screen: Low Risk (11/30/2023)   Alcohol Screen    Last Alcohol Screening Score (AUDIT): 2  Housing: Unknown (11/30/2023)   Epic    Unable to Pay for Housing in the Last Year: No    Number of Times Moved in the Last Year: Not on file    Homeless in the Last Year: No  Utilities: Not on file  Health Literacy: Not on file    Medications Ordered Prior to Encounter[1]  Allergies[2]     Observations/Objective: There were no vitals filed for this visit. There is no height or weight on file to calculate BMI.  Physical Exam Neurological:     Mental Status: She is alert.     CBC    Component Value Date/Time   WBC 5.9 11/13/2023 0902   WBC 5.9 11/18/2022 1023   RBC 4.38 11/13/2023 0902   HGB 12.8 11/13/2023 0902   HCT 38.7 11/13/2023 0902   PLT 241 11/13/2023 0902   MCV 88.4 11/13/2023 0902   MCH 29.2 11/13/2023 0902   MCHC 33.1 11/13/2023 0902   RDW 11.9 11/13/2023 0902   LYMPHSABS 2.2 11/13/2023 0902   MONOABS 0.4 11/13/2023 0902   EOSABS 0.3 11/13/2023 0902   BASOSABS 0.1 11/13/2023 0902    CMP     Component Value Date/Time   NA 138 09/23/2021 0929   K 4.0 09/23/2021 0929   CL 103 09/23/2021 0929    CO2 28 09/23/2021 0929   GLUCOSE 93 09/23/2021 0929   BUN 10 09/23/2021 0929   CREATININE 0.75 09/23/2021 0929   CALCIUM 8.8 09/23/2021 0929   PROT 6.7 09/23/2021 0929   ALBUMIN 4.3 09/23/2021 0929   AST 16 09/23/2021 0929   ALT 8 09/23/2021 0929   ALKPHOS 59 09/23/2021 0929   BILITOT 0.6 09/23/2021 0929      ASSESSMENT & PLAN:   B12 deficiency Positive intrinsic factor antibody- history of pernicious anemia. B12 has improved, slightly elevated.  Recommend to  decrease sublingual B12 2500mcg from daily to 1-2 times per week.  Monitor B12 levels Q6 months.     Orders Placed This Encounter  Procedures   CBC with Differential (Cancer Center Only)    Standing Status:   Future    Expected Date:   05/23/2025    Expiration Date:   05/23/2025   Vitamin B12    Standing Status:   Future    Expected Date:   05/23/2025    Expiration Date:   05/23/2025   Vitamin B12    Standing Status:   Future    Expected Date:   11/20/2024    Expiration Date:   02/18/2025     I discussed the assessment and treatment plan with the patient. The patient was provided an opportunity to ask questions and all were answered. The patient agreed with the plan and demonstrated an understanding of the instructions.  The patient was advised to call back or seek an in-person evaluation if the symptoms worsen or if the condition fails to improve as anticipated.   I provided 15 minutes of face-to-face video visit time during this encounter, and > 50% was spent counseling as documented under my assessment & plan.  Zelphia Cap, MD 05/23/2024 9:59 PM      [1]  Current Outpatient Medications on File Prior to Visit  Medication Sig Dispense Refill   Cyanocobalamin  (B-12) 2500 MCG SUBL Place 2,500 mcg under the tongue daily. 90 tablet 1   methylphenidate  (RITALIN ) 20 MG tablet Take 1 tablet (20 mg total) by mouth 2 (two) times daily with breakfast and lunch. 60 tablet 0   methylphenidate  (RITALIN ) 5 MG tablet Take 1 tablet (5  mg total) by mouth 2 (two) times daily. TAKE WITH 20 MG DOSE TWICE Daily 60 tablet 0   Multiple Vitamin (MULTIVITAMIN) tablet Take 1 tablet by mouth daily.     hydroquinone 4 % cream Apply topically at bedtime. (Patient not taking: Reported on 05/23/2024)     meloxicam  (MOBIC ) 7.5 MG tablet TAKE 1 TABLET BY MOUTH EVERY DAY AS NEEDED FOR PAIN (Patient not taking: Reported on 05/23/2024) 30 tablet 1   methylphenidate  (RITALIN ) 20 MG tablet Take 1 tablet (20 mg total) by mouth 2 (two) times daily with breakfast and lunch. With 5 mg tablet 60 tablet 0   methylphenidate  (RITALIN ) 20 MG tablet Take 1 tablet (20 mg total) by mouth 2 (two) times daily with breakfast and lunch. 60 tablet 0   methylphenidate  (RITALIN ) 20 MG tablet Take 1 tablet (20 mg total) by mouth 2 (two) times daily with breakfast and lunch. (Patient not taking: Reported on 05/23/2024) 60 tablet 0   methylphenidate  (RITALIN ) 20 MG tablet Take 1 tablet (20 mg total) by mouth 2 (two) times daily with breakfast and lunch. To complete current mont prescripton (Patient not taking: Reported on 05/23/2024) 60 tablet 0   methylphenidate  (RITALIN ) 5 MG tablet Take 1 tablet (5 mg total) by mouth 2 (two) times daily. TAKE WITH 20 MG DOSE TWICE Daily (Patient not taking: Reported on 05/23/2024) 60 tablet 0   methylphenidate  (RITALIN ) 5 MG tablet Take 1 tablet (5 mg total) by mouth 2 (two) times daily. TAKE WITH 20 MG DOSE TWICE Daily (Patient not taking: Reported on 05/23/2024) 60 tablet 0   OVER THE COUNTER MEDICATION Ptt aking lions manes powder (Patient not taking: Reported on 05/23/2024)     Red Yeast Rice Extract (RED YEAST RICE PO) Take by mouth. (Patient not taking: Reported on 05/23/2024)  Syringe/Needle, Disp, (SYRINGE 3CC/25GX1) 25G X 1 3 ML MISC Use to give b12 injection once monthly (Patient not taking: Reported on 05/23/2024) 3 each 3   No current facility-administered medications on file prior to visit.  [2]  Allergies Allergen Reactions    Codeine     Headache

## 2024-05-23 NOTE — Assessment & Plan Note (Addendum)
 Positive intrinsic factor antibody- history of pernicious anemia. B12 has improved, slightly elevated.  Recommend to decrease sublingual B12 2500mcg from daily to 1-2 times per week.  Monitor B12 levels Q6 months.

## 2024-05-27 ENCOUNTER — Telehealth: Admitting: Family Medicine

## 2024-05-27 DIAGNOSIS — J40 Bronchitis, not specified as acute or chronic: Secondary | ICD-10-CM | POA: Diagnosis not present

## 2024-05-27 MED ORDER — DOXYCYCLINE HYCLATE 100 MG PO TABS: 100.0000 mg | ORAL_TABLET | Freq: Two times a day (BID) | ORAL | 0 refills | Status: AC

## 2024-05-27 MED ORDER — PREDNISONE 10 MG (21) PO TBPK: ORAL_TABLET | ORAL | 0 refills | Status: DC

## 2024-05-27 NOTE — Progress Notes (Signed)
 " Virtual Visit Consent   Crystal Reyes, you are scheduled for a virtual visit with a [facility] provider today. Just as with appointments in the office, your consent must be obtained to participate. Your consent will be active for this visit and any virtual visit you may have with one of our providers in the next 365 days. If you have a MyChart account, a copy of this consent can be sent to you electronically.  As this is a virtual visit, video technology does not allow for your provider to perform a traditional examination. This may limit your provider's ability to fully assess your condition. If your provider identifies any concerns that need to be evaluated in person or the need to arrange testing (such as labs, EKG, etc.), we will make arrangements to do so. Although advances in technology are sophisticated, we cannot ensure that it will always work on either your end or our end. If the connection with a video visit is poor, the visit may have to be switched to a telephone visit. With either a video or telephone visit, we are not always able to ensure that we have a secure connection.  By engaging in this virtual visit, you consent to the provision of healthcare and authorize for your insurance to be billed (if applicable) for the services provided during this visit. Depending on your insurance coverage, you may receive a charge related to this service.  I need to obtain your verbal consent now. Are you willing to proceed with your visit today? Crystal Reyes has provided verbal consent on 05/27/2024 for a virtual visit (video or telephone). Crystal Reyes, [place]  Date: 05/27/2024 4:06 PM   Virtual Visit via Video Note   I, Crystal Reyes, connected with  Crystal Reyes  ([id number], 03/07/1971) on 05/27/24 at  4:00 PM EST by a video-enabled telemedicine application and verified that I am speaking with the correct person using two identifiers.  Location: Patient:  Virtual Visit Location Patient: Home Provider: Virtual Visit Location Provider: Home Office   I discussed the limitations of evaluation and management by telemedicine and the availability of in person appointments. The patient expressed understanding and agreed to proceed.    History of Present Illness: Crystal Reyes is a 54 y.o. who identifies as a female who was assigned female at birth, and is being seen today for c/o cough and congestion.  Pt denies fever.  Pt states started has a dry cough and is now a wet cough with congestion.  Pt states symptoms started Wednesday of last week.  Pt states more congested today and is coughing up a little green in color.  Pt states she has headaches.  Pt denies chills but reports feeling very tired.  Pt states taking over the counter cough syrup and Alka selter tabs.  Pt states she also has sore throat but thinks it may be from the coughing. Pt denies sick contacts but she reports a week prior to being sick her son was sick.   HPI: HPI  Problems:  Patient Active Problem List   Diagnosis Date Noted   B12 deficiency 09/27/2022   Right low back pain 01/31/2022   Tick bite of scalp 09/25/2021   Overweight (BMI 25.0-29.9) 04/01/2020   COVID-19 vaccine regimen to maintain immunity completed 04/01/2020   Vitamin D  deficiency 08/30/2015   Thoracic spondylosis without myelopathy 08/25/2015   Special screening for malignant neoplasms, colon 04/29/2012   Adult attention deficit disorder    Abnormal  Pap smear of cervix     Allergies: Allergies[1] Medications: Current Medications[2]  Observations/Objective: Patient is well-developed, well-nourished in no acute distress.  Resting comfortably  at home.  Head is normocephalic, atraumatic.  No labored breathing.  Speech is clear and coherent with logical content.  Patient is alert and oriented at baseline.    Assessment and Plan: 1. Bronchitis (Primary) - doxycycline  (VIBRA -TABS) 100 MG tablet;  Take 1 tablet (100 mg total) by mouth 2 (two) times daily for 7 days.  Dispense: 14 tablet; Refill: 0 - predniSONE  (STERAPRED UNI-PAK 21 TAB) 10 MG (21) TBPK tablet; Take following package directions.  Dispense: 21 tablet; Refill: 0  -Pt advised to follow up with in person urgent care or PCP if symptoms persist or worsen  Follow Up Instructions: I discussed the assessment and treatment plan with the patient. The patient was provided an opportunity to ask questions and all were answered. The patient agreed with the plan and demonstrated an understanding of the instructions.  A copy of instructions were sent to the patient via MyChart unless otherwise noted below.    The patient was advised to call back or seek an in-person evaluation if the symptoms worsen or if the condition fails to improve as anticipated.    Corley Kohls, PA-C    [1]  Allergies Allergen Reactions   Codeine     Headache  [2]  Current Outpatient Medications:    doxycycline  (VIBRA -TABS) 100 MG tablet, Take 1 tablet (100 mg total) by mouth 2 (two) times daily for 7 days., Disp: 14 tablet, Rfl: 0   predniSONE  (STERAPRED UNI-PAK 21 TAB) 10 MG (21) TBPK tablet, Take following package directions., Disp: 21 tablet, Rfl: 0   Cyanocobalamin  (B-12) 2500 MCG SUBL, Place 2,500 mcg under the tongue daily., Disp: 90 tablet, Rfl: 1   hydroquinone 4 % cream, Apply topically at bedtime. (Patient not taking: Reported on 05/23/2024), Disp: , Rfl:    meloxicam  (MOBIC ) 7.5 MG tablet, TAKE 1 TABLET BY MOUTH EVERY DAY AS NEEDED FOR PAIN (Patient not taking: Reported on 05/23/2024), Disp: 30 tablet, Rfl: 1   methylphenidate  (RITALIN ) 20 MG tablet, Take 1 tablet (20 mg total) by mouth 2 (two) times daily with breakfast and lunch. With 5 mg tablet, Disp: 60 tablet, Rfl: 0   methylphenidate  (RITALIN ) 20 MG tablet, Take 1 tablet (20 mg total) by mouth 2 (two) times daily with breakfast and lunch., Disp: 60 tablet, Rfl: 0   methylphenidate  (RITALIN )  20 MG tablet, Take 1 tablet (20 mg total) by mouth 2 (two) times daily with breakfast and lunch. (Patient not taking: Reported on 05/23/2024), Disp: 60 tablet, Rfl: 0   methylphenidate  (RITALIN ) 20 MG tablet, Take 1 tablet (20 mg total) by mouth 2 (two) times daily with breakfast and lunch., Disp: 60 tablet, Rfl: 0   methylphenidate  (RITALIN ) 20 MG tablet, Take 1 tablet (20 mg total) by mouth 2 (two) times daily with breakfast and lunch. To complete current mont prescripton (Patient not taking: Reported on 05/23/2024), Disp: 60 tablet, Rfl: 0   methylphenidate  (RITALIN ) 5 MG tablet, Take 1 tablet (5 mg total) by mouth 2 (two) times daily. TAKE WITH 20 MG DOSE TWICE Daily, Disp: 60 tablet, Rfl: 0   methylphenidate  (RITALIN ) 5 MG tablet, Take 1 tablet (5 mg total) by mouth 2 (two) times daily. TAKE WITH 20 MG DOSE TWICE Daily (Patient not taking: Reported on 05/23/2024), Disp: 60 tablet, Rfl: 0   methylphenidate  (RITALIN ) 5 MG tablet, Take 1 tablet (  5 mg total) by mouth 2 (two) times daily. TAKE WITH 20 MG DOSE TWICE Daily (Patient not taking: Reported on 05/23/2024), Disp: 60 tablet, Rfl: 0   Multiple Vitamin (MULTIVITAMIN) tablet, Take 1 tablet by mouth daily., Disp: , Rfl:    OVER THE COUNTER MEDICATION, Ptt aking lions manes powder (Patient not taking: Reported on 05/23/2024), Disp: , Rfl:    Red Yeast Rice Extract (RED YEAST RICE PO), Take by mouth. (Patient not taking: Reported on 05/23/2024), Disp: , Rfl:    Syringe/Needle, Disp, (SYRINGE 3CC/25GX1) 25G X 1 3 ML MISC, Use to give b12 injection once monthly (Patient not taking: Reported on 05/23/2024), Disp: 3 each, Rfl: 3  "

## 2024-05-27 NOTE — Patient Instructions (Signed)
 " Crystal Reyes, thank you for joining Roosvelt Mater, PA-C for today's virtual visit.  While this provider is not your primary care provider (PCP), if your PCP is located in our provider database this encounter information will be shared with them immediately following your visit.   A [facility] MyChart account gives you access to today's visit and all your visits, tests, and labs performed at [facility]  click here if you don't have a [facility] MyChart account or go to [email or web address]  Consent: (Patient) Crystal Reyes provided verbal consent for this virtual visit at the beginning of the encounter.  Current Medications:  Current Outpatient Medications:    doxycycline  (VIBRA -TABS) 100 MG tablet, Take 1 tablet (100 mg total) by mouth 2 (two) times daily for 7 days., Disp: 14 tablet, Rfl: 0   predniSONE  (STERAPRED UNI-PAK 21 TAB) 10 MG (21) TBPK tablet, Take following package directions., Disp: 21 tablet, Rfl: 0   Cyanocobalamin  (B-12) 2500 MCG SUBL, Place 2,500 mcg under the tongue daily., Disp: 90 tablet, Rfl: 1   hydroquinone 4 % cream, Apply topically at bedtime. (Patient not taking: Reported on 05/23/2024), Disp: , Rfl:    meloxicam  (MOBIC ) 7.5 MG tablet, TAKE 1 TABLET BY MOUTH EVERY DAY AS NEEDED FOR PAIN (Patient not taking: Reported on 05/23/2024), Disp: 30 tablet, Rfl: 1   methylphenidate  (RITALIN ) 20 MG tablet, Take 1 tablet (20 mg total) by mouth 2 (two) times daily with breakfast and lunch. With 5 mg tablet, Disp: 60 tablet, Rfl: 0   methylphenidate  (RITALIN ) 20 MG tablet, Take 1 tablet (20 mg total) by mouth 2 (two) times daily with breakfast and lunch., Disp: 60 tablet, Rfl: 0   methylphenidate  (RITALIN ) 20 MG tablet, Take 1 tablet (20 mg total) by mouth 2 (two) times daily with breakfast and lunch. (Patient not taking: Reported on 05/23/2024), Disp: 60 tablet, Rfl: 0   methylphenidate  (RITALIN ) 20 MG tablet, Take 1 tablet (20 mg total) by  mouth 2 (two) times daily with breakfast and lunch., Disp: 60 tablet, Rfl: 0   methylphenidate  (RITALIN ) 20 MG tablet, Take 1 tablet (20 mg total) by mouth 2 (two) times daily with breakfast and lunch. To complete current mont prescripton (Patient not taking: Reported on 05/23/2024), Disp: 60 tablet, Rfl: 0   methylphenidate  (RITALIN ) 5 MG tablet, Take 1 tablet (5 mg total) by mouth 2 (two) times daily. TAKE WITH 20 MG DOSE TWICE Daily, Disp: 60 tablet, Rfl: 0   methylphenidate  (RITALIN ) 5 MG tablet, Take 1 tablet (5 mg total) by mouth 2 (two) times daily. TAKE WITH 20 MG DOSE TWICE Daily (Patient not taking: Reported on 05/23/2024), Disp: 60 tablet, Rfl: 0   methylphenidate  (RITALIN ) 5 MG tablet, Take 1 tablet (5 mg total) by mouth 2 (two) times daily. TAKE WITH 20 MG DOSE TWICE Daily (Patient not taking: Reported on 05/23/2024), Disp: 60 tablet, Rfl: 0   Multiple Vitamin (MULTIVITAMIN) tablet, Take 1 tablet by mouth daily., Disp: , Rfl:    OVER THE COUNTER MEDICATION, Ptt aking lions manes powder (Patient not taking: Reported on 05/23/2024), Disp: , Rfl:    Red Yeast Rice Extract (RED YEAST RICE PO), Take by mouth. (Patient not taking: Reported on 05/23/2024), Disp: , Rfl:    Syringe/Needle, Disp, (SYRINGE 3CC/25GX1) 25G X 1 3 ML MISC, Use to give b12 injection once monthly (Patient not taking: Reported on 05/23/2024), Disp: 3 each, Rfl: 3   Medications ordered in this encounter:  Meds ordered this encounter  Medications   doxycycline  (VIBRA -TABS) 100 MG tablet    Sig: Take 1 tablet (100 mg total) by mouth 2 (two) times daily for 7 days.    Dispense:  14 tablet    Refill:  0   predniSONE  (STERAPRED UNI-PAK 21 TAB) 10 MG (21) TBPK tablet    Sig: Take following package directions.    Dispense:  21 tablet    Refill:  0    Please dispense one standard blister pack taper.     *If you need refills on other medications prior to your next appointment, please contact your pharmacy*  Follow-Up: Call  back or seek an in-person evaluation if the symptoms worsen or if the condition fails to improve as anticipated.  [facility] [phone]  Other Instructions Acute Bronchitis, Adult  Acute bronchitis is sudden inflammation of the main airways (bronchi) that come off the windpipe (trachea) in the lungs. The swelling causes the airways to get smaller and make more mucus than normal. This can make it hard to breathe and can cause coughing or noisy breathing (wheezing). Acute bronchitis may last several weeks. The cough may last longer. Allergies, asthma, and exposure to smoke may make the condition worse. What are the causes? This condition can be caused by germs and by substances that irritate the lungs, including: Cold and flu viruses. The most common cause of this condition is the virus that causes the common cold. Bacteria. This is less common. Breathing in substances that irritate the lungs, including: Smoke from cigarettes and other forms of tobacco. Dust and pollen. Fumes from household cleaning products, gases, or burned fuel. Indoor or outdoor air pollution. What increases the risk? The following factors may make you more likely to develop this condition: A weak body's defense system, also called the immune system. A condition that affects your lungs and breathing, such as asthma. What are the signs or symptoms? Common symptoms of this condition include: Coughing. This may bring up clear, yellow, or green mucus from your lungs (sputum). Wheezing. Runny or stuffy nose. Having too much mucus in your lungs (chest congestion). Shortness of breath. Aches and pains, including sore throat or chest. How is this diagnosed? This condition is usually diagnosed based on: Your symptoms and medical history. A physical exam. You may also have other tests, including tests to rule out other conditions, such as pneumonia. These tests include: A test of lung function. Test of a  mucus sample to look for the presence of bacteria. Tests to check the oxygen level in your blood. Blood tests. Chest X-ray. How is this treated? Most cases of acute bronchitis clear up over time without treatment. Your health care provider may recommend: Drinking more fluids to help thin your mucus so it is easier to cough up. Taking inhaled medicine (inhaler) to improve air flow in and out of your lungs. Using a vaporizer or a humidifier. These are machines that add water to the air to help you breathe better. Taking a medicine that thins mucus and clears congestion (expectorant). Taking a medicine that prevents or stops coughing (cough suppressant). It is not common to take an antibiotic medicine for this condition. Follow these instructions at home:  Take over-the-counter and prescription medicines only as told by your health care provider. Use an inhaler, vaporizer, or humidifier as told by your health care provider. Take two teaspoons (10 mL) of honey at bedtime to lessen coughing at night. Drink enough fluid to keep your urine pale yellow.  Do not use any products that contain nicotine or tobacco. These products include cigarettes, chewing tobacco, and vaping devices, such as e-cigarettes. If you need help quitting, ask your health care provider. Get plenty of rest. Return to your normal activities as told by your health care provider. Ask your health care provider what activities are safe for you. Keep all follow-up visits. This is important. How is this prevented? To lower your risk of getting this condition again: Wash your hands often with soap and water for at least 20 seconds. If soap and water are not available, use hand sanitizer. Avoid contact with people who have cold symptoms. Try not to touch your mouth, nose, or eyes with your hands. Avoid breathing in smoke or chemical fumes. Breathing smoke or chemical fumes will make your condition worse. Get the flu shot every  year. Contact a health care provider if: Your symptoms do not improve after 2 weeks. You have trouble coughing up the mucus. Your cough keeps you awake at night. You have a fever. Get help right away if you: Cough up blood. Feel pain in your chest. Have severe shortness of breath. Faint or keep feeling like you are going to faint. Have a severe headache. Have a fever or chills that get worse. These symptoms may represent a serious problem that is an emergency. Do not wait to see if the symptoms will go away. Get medical help right away. Call your local emergency services (911 in the [place] Do not drive yourself to the hospital. Summary Acute bronchitis is inflammation of the main airways (bronchi) that come off the windpipe (trachea) in the lungs. The swelling causes the airways to get smaller and make more mucus than normal. Drinking more fluids can help thin your mucus so it is easier to cough up. Take over-the-counter and prescription medicines only as told by your health care provider. Do not use any products that contain nicotine or tobacco. These products include cigarettes, chewing tobacco, and vaping devices, such as e-cigarettes. If you need help quitting, ask your health care provider. Contact a health care provider if your symptoms do not improve after 2 weeks. This information is not intended to replace advice given to you by your health care provider. Make sure you discuss any questions you have with your health care provider. Document Revised: 08/05/2021 Document Reviewed: 08/26/2020 Elsevier Patient Education  2024 Elsevier Inc.   If you have been instructed to have an in-person evaluation today at a local Urgent Care facility, please use the link below. It will take you to a list of all of our available [facility], including address, phone number and hours of operation. Please do not delay care.  [facility]  If you or a family member do not  have a primary care provider, use the link below to schedule a visit and establish care. When you choose a [facility] primary care physician or advanced practice provider, you gain a long-term partner in health. Find a Primary Care Provider  Learn more about [facility]'s in-office and virtual care options: [facility] - Get Care Now  "

## 2024-06-03 ENCOUNTER — Telehealth: Admitting: Physician Assistant

## 2024-06-03 DIAGNOSIS — J40 Bronchitis, not specified as acute or chronic: Secondary | ICD-10-CM

## 2024-06-03 NOTE — Progress Notes (Signed)
" °  Because you have failed a first-line treatment, I feel your condition warrants further evaluation and I recommend that you be seen in a face-to-face visit for a formal lung exam and possible imaging to rule out pneumonia.   NOTE: There will be NO CHARGE for this E-Visit   If you are having a true medical emergency, please call 911.     For an urgent face to face visit, [facility] has multiple urgent care centers for your convenience.  Click the link below for the full list of locations and hours, walk-in wait times, appointment scheduling options and driving directions:  Urgent Care - [place], [place], [place], [place], [place], [place]  [place]     Your MyChart E-visit questionnaire answers were reviewed by a board certified advanced clinical practitioner to complete your personal care plan based on your specific symptoms.    Thank you for using e-Visits.    "

## 2024-06-07 ENCOUNTER — Other Ambulatory Visit: Payer: Self-pay

## 2024-06-07 ENCOUNTER — Ambulatory Visit: Admitting: Internal Medicine

## 2024-06-07 VITALS — BP 120/70 | HR 79 | Temp 98.6°F | Resp 16 | Ht 64.0 in | Wt 162.4 lb

## 2024-06-07 DIAGNOSIS — R051 Acute cough: Secondary | ICD-10-CM | POA: Diagnosis not present

## 2024-06-07 DIAGNOSIS — J069 Acute upper respiratory infection, unspecified: Secondary | ICD-10-CM | POA: Diagnosis not present

## 2024-06-07 MED ORDER — BENZONATATE 100 MG PO CAPS: 100.0000 mg | ORAL_CAPSULE | Freq: Two times a day (BID) | ORAL | 0 refills | Status: AC | PRN

## 2024-06-07 NOTE — Progress Notes (Signed)
 "  Acute Office Visit  Subjective:     Patient ID: Crystal Reyes, female    DOB: [birth year], 54 y.o.   MRN: [id number]  Chief Complaint  Patient presents with   Cough    For 3 weeks    Cough Associated symptoms include headaches. Pertinent negatives include no chills, ear pain, fever, sore throat, shortness of breath or wheezing.   Patient is in today for cough x 3 weeks. This is my first time meeting her.   Discussed the use of AI scribe software for clinical note transcription with the patient, who gave verbal consent to proceed.  History of Present Illness Crystal Reyes is a 54 year old female who presents with a persistent cough and congestion for three weeks.  She reports the cough started dry and then became productive with congestion over about three weeks. She completed prednisone  and doxycycline  after a virtual visit on January 19, but symptoms did not improve as she usually does for her.  She denies fever but has significant headaches, fatigue, and mild nasal drainage and congestion. She denies ear or sinus pain.  She has pneumonia and walking pneumonia in the past and is concerned about recurrence. She used an old albuterol  inhaler from a prior pneumonia, which gave some relief. She feels her breathing is a little off and a little tight at times, especially when lying down at night.  She has been using Alka-Seltzer Cold and Flu (contains Tylenol) and previously had prescription cough syrup but has run out. She has not used nasal sprays.  She denies shortness of breath or wheezing but coughs more when lying in bed. She has also been taking vitamin C and zinc.     Review of Systems  Constitutional:  Negative for chills and fever.  HENT:  Positive for congestion. Negative for ear discharge, ear pain, sinus pain and sore throat.   Respiratory:  Positive for cough and sputum production. Negative for shortness of breath and wheezing.    Neurological:  Positive for headaches.        Objective:    BP 120/70   Pulse 79   Temp 98.6 F (37 C) (Oral)   Resp 16   Ht 5' 4 (1.626 m)   Wt 162 lb 6.4 oz (73.7 kg)   SpO2 99%   BMI 27.88 kg/m  BP Readings from Last 3 Encounters:  06/07/24 120/70  11/15/23 121/64  05/24/23 (!) 134/92   Wt Readings from Last 3 Encounters:  06/07/24 162 lb 6.4 oz (73.7 kg)  12/01/23 161 lb (73 kg)  11/15/23 157 lb (71.2 kg)      Physical Exam Constitutional:      Appearance: Normal appearance.  HENT:     Head: Normocephalic and atraumatic.     Right Ear: Tympanic membrane, ear canal and external ear normal.     Left Ear: Tympanic membrane, ear canal and external ear normal.     Nose: Congestion present.     Mouth/Throat:     Mouth: Mucous membranes are moist.     Pharynx: Posterior oropharyngeal erythema present.  Eyes:     Conjunctiva/sclera: Conjunctivae normal.  Cardiovascular:     Rate and Rhythm: Normal rate and regular rhythm.  Pulmonary:     Effort: Pulmonary effort is normal.     Breath sounds: Normal breath sounds. No wheezing, rhonchi or rales.  Skin:    General: Skin is warm and dry.  Neurological:     General:  No focal deficit present.     Mental Status: She is alert. Mental status is at baseline.  Psychiatric:        Mood and Affect: Mood normal.        Behavior: Behavior normal.     No results found for any visits on 06/07/24.      Assessment & Plan:   Assessment & Plan Acute upper respiratory infection with persistent cough Persistent cough and congestion for three weeks, likely post-viral with sinus drainage. No need for additional antibiotics or steroids. Chest x-ray deferred unless symptoms worsen. - Use nasal saline spray multiple times a day. - Use Flonase or Nasacort nasal spray, two sprays in each nostril twice a day. - Consider decongestants like Allegra D or Claritin D. - Use albuterol  inhaler as needed, especially at bedtime and in  the morning. - Prescribed Tessalon  Perles for cough management. - Monitor symptoms and contact provider if symptoms worsen or do not improve within a week.  - benzonatate  (TESSALON ) 100 MG capsule; Take 1 capsule (100 mg total) by mouth 2 (two) times daily as needed for cough.  Dispense: 20 capsule; Refill: 0   Return if symptoms worsen or fail to improve.  Sharyle Fischer, DO   "

## 2024-09-09 ENCOUNTER — Ambulatory Visit: Admitting: Internal Medicine

## 2024-11-20 ENCOUNTER — Inpatient Hospital Stay

## 2025-05-26 ENCOUNTER — Inpatient Hospital Stay

## 2025-05-29 ENCOUNTER — Inpatient Hospital Stay: Admitting: Oncology
